# Patient Record
Sex: Male | Born: 1945 | Race: White | Hispanic: No | Marital: Married | Smoking: Former smoker
Health system: Southern US, Community
[De-identification: ages and names within clinical notes are randomized; demographics above are authoritative.]

## PROBLEM LIST (undated history)

## (undated) DIAGNOSIS — I10 Essential (primary) hypertension: Secondary | ICD-10-CM

## (undated) DIAGNOSIS — F4310 Post-traumatic stress disorder, unspecified: Secondary | ICD-10-CM

## (undated) DIAGNOSIS — Z720 Tobacco use: Secondary | ICD-10-CM

## (undated) DIAGNOSIS — I219 Acute myocardial infarction, unspecified: Secondary | ICD-10-CM

## (undated) DIAGNOSIS — J449 Chronic obstructive pulmonary disease, unspecified: Secondary | ICD-10-CM

## (undated) DIAGNOSIS — I350 Nonrheumatic aortic (valve) stenosis: Secondary | ICD-10-CM

## (undated) DIAGNOSIS — I472 Ventricular tachycardia, unspecified: Secondary | ICD-10-CM

## (undated) DIAGNOSIS — I2510 Atherosclerotic heart disease of native coronary artery without angina pectoris: Secondary | ICD-10-CM

## (undated) DIAGNOSIS — Z95810 Presence of automatic (implantable) cardiac defibrillator: Secondary | ICD-10-CM

## (undated) HISTORY — PX: OTHER SURGICAL HISTORY: SHX169

## (undated) HISTORY — DX: Presence of automatic (implantable) cardiac defibrillator: Z95.810

## (undated) HISTORY — DX: Atherosclerotic heart disease of native coronary artery without angina pectoris: I25.10

## (undated) HISTORY — DX: Post-traumatic stress disorder, unspecified: F43.10

## (undated) HISTORY — PX: NOSE SURGERY: SHX723

## (undated) HISTORY — PX: CORONARY ANGIOPLASTY: SHX604

## (undated) HISTORY — DX: Nonrheumatic aortic (valve) stenosis: I35.0

## (undated) HISTORY — DX: Essential (primary) hypertension: I10

## (undated) HISTORY — DX: Tobacco use: Z72.0

## (undated) HISTORY — PX: CARDIAC CATHETERIZATION: SHX172

## (undated) SURGERY — Surgical Case
Anesthesia: *Unknown

---

## 2007-12-15 ENCOUNTER — Emergency Department (HOSPITAL_COMMUNITY): Admission: EM | Admit: 2007-12-15 | Discharge: 2007-12-15 | Payer: Self-pay | Admitting: Family Medicine

## 2011-05-14 LAB — POCT I-STAT, CHEM 8
Creatinine, Ser: 1
HCT: 56 — ABNORMAL HIGH
Hemoglobin: 19 — ABNORMAL HIGH
Potassium: 4.3
Sodium: 133 — ABNORMAL LOW

## 2011-05-14 LAB — CBC
MCV: 88.7
Platelets: 270
RDW: 14.9
WBC: 19.4 — ABNORMAL HIGH

## 2011-05-14 LAB — DIFFERENTIAL
Basophils Absolute: 0.1
Eosinophils Absolute: 0.1
Lymphs Abs: 2.2
Neutrophils Relative %: 80 — ABNORMAL HIGH

## 2011-07-16 ENCOUNTER — Inpatient Hospital Stay (HOSPITAL_COMMUNITY)
Admission: EM | Admit: 2011-07-16 | Discharge: 2011-07-31 | DRG: 217 | Disposition: A | Discharge status: Home / Self Care | Payer: Medicare Other | Attending: Thoracic Surgery (Cardiothoracic Vascular Surgery) | Admitting: Thoracic Surgery (Cardiothoracic Vascular Surgery)

## 2011-07-16 ENCOUNTER — Encounter: Payer: Self-pay | Admitting: Emergency Medicine

## 2011-07-16 DIAGNOSIS — I509 Heart failure, unspecified: Secondary | ICD-10-CM | POA: Diagnosis present

## 2011-07-16 DIAGNOSIS — I723 Aneurysm of iliac artery: Secondary | ICD-10-CM | POA: Diagnosis present

## 2011-07-16 DIAGNOSIS — I4891 Unspecified atrial fibrillation: Secondary | ICD-10-CM | POA: Diagnosis not present

## 2011-07-16 DIAGNOSIS — E119 Type 2 diabetes mellitus without complications: Secondary | ICD-10-CM | POA: Diagnosis present

## 2011-07-16 DIAGNOSIS — R51 Headache: Secondary | ICD-10-CM | POA: Diagnosis not present

## 2011-07-16 DIAGNOSIS — D62 Acute posthemorrhagic anemia: Secondary | ICD-10-CM | POA: Diagnosis not present

## 2011-07-16 DIAGNOSIS — F17200 Nicotine dependence, unspecified, uncomplicated: Secondary | ICD-10-CM | POA: Diagnosis present

## 2011-07-16 DIAGNOSIS — E7800 Pure hypercholesterolemia, unspecified: Secondary | ICD-10-CM | POA: Diagnosis present

## 2011-07-16 DIAGNOSIS — I4901 Ventricular fibrillation: Secondary | ICD-10-CM

## 2011-07-16 DIAGNOSIS — Z95810 Presence of automatic (implantable) cardiac defibrillator: Secondary | ICD-10-CM

## 2011-07-16 DIAGNOSIS — I200 Unstable angina: Secondary | ICD-10-CM

## 2011-07-16 DIAGNOSIS — I359 Nonrheumatic aortic valve disorder, unspecified: Secondary | ICD-10-CM | POA: Diagnosis present

## 2011-07-16 DIAGNOSIS — J449 Chronic obstructive pulmonary disease, unspecified: Secondary | ICD-10-CM | POA: Diagnosis present

## 2011-07-16 DIAGNOSIS — J4489 Other specified chronic obstructive pulmonary disease: Secondary | ICD-10-CM | POA: Diagnosis present

## 2011-07-16 DIAGNOSIS — Z951 Presence of aortocoronary bypass graft: Secondary | ICD-10-CM

## 2011-07-16 DIAGNOSIS — Z952 Presence of prosthetic heart valve: Secondary | ICD-10-CM

## 2011-07-16 DIAGNOSIS — I252 Old myocardial infarction: Secondary | ICD-10-CM

## 2011-07-16 DIAGNOSIS — I2510 Atherosclerotic heart disease of native coronary artery without angina pectoris: Secondary | ICD-10-CM | POA: Diagnosis present

## 2011-07-16 DIAGNOSIS — Z886 Allergy status to analgesic agent status: Secondary | ICD-10-CM

## 2011-07-16 HISTORY — DX: Acute myocardial infarction, unspecified: I21.9

## 2011-07-16 HISTORY — DX: Ventricular tachycardia: I47.2

## 2011-07-16 HISTORY — DX: Ventricular tachycardia, unspecified: I47.20

## 2011-07-16 HISTORY — DX: Chronic obstructive pulmonary disease, unspecified: J44.9

## 2011-07-16 LAB — CBC
MCV: 88.4 fL (ref 78.0–100.0)
Platelets: 199 10*3/uL (ref 150–400)
RBC: 4.76 MIL/uL (ref 4.22–5.81)
RDW: 13.6 % (ref 11.5–15.5)
WBC: 9.4 10*3/uL (ref 4.0–10.5)

## 2011-07-16 LAB — CARDIAC PANEL(CRET KIN+CKTOT+MB+TROPI)
Relative Index: 2.6 — ABNORMAL HIGH (ref 0.0–2.5)
Troponin I: <0.3 ng/mL (ref <0.30)

## 2011-07-16 LAB — APTT: aPTT: 44 seconds — ABNORMAL HIGH (ref 24–37)

## 2011-07-16 LAB — COMPREHENSIVE METABOLIC PANEL
ALT: 18 U/L (ref 0–53)
AST: 20 U/L (ref 0–37)
Alkaline Phosphatase: 61 U/L (ref 39–117)
CO2: 29 mEq/L (ref 19–32)
GFR calc Af Amer: >90 mL/min (ref >90)
GFR calc non Af Amer: 87 mL/min — ABNORMAL LOW (ref >90)
Glucose, Bld: 106 mg/dL — ABNORMAL HIGH (ref 70–99)
Potassium: 3.5 mEq/L (ref 3.5–5.1)
Sodium: 141 mEq/L (ref 135–145)

## 2011-07-16 LAB — DIFFERENTIAL
Basophils Absolute: 0 10*3/uL (ref 0.0–0.1)
Lymphocytes Relative: 31 % (ref 12–46)
Lymphs Abs: 3 10*3/uL (ref 0.7–4.0)
Neutro Abs: 5.4 10*3/uL (ref 1.7–7.7)
Neutrophils Relative %: 58 % (ref 43–77)

## 2011-07-16 LAB — PROTIME-INR
INR: 0.95 (ref 0.00–1.49)
Prothrombin Time: 12.9 seconds (ref 11.6–15.2)

## 2011-07-16 MED ORDER — CHOLECALCIFEROL 400 UNIT/ML PO LIQD
400.0000 [IU] | Freq: Every day | ORAL | Status: DC
  Administered 2011-07-17: 400 [IU] via ORAL
  Filled 2011-07-16 (×5): qty 1

## 2011-07-16 MED ORDER — FLUNISOLIDE 25 MCG/ACT (0.025%) NA SOLN: 2.0000 | Freq: Two times a day (BID) | NASAL | Status: DC

## 2011-07-16 MED ORDER — BUDESONIDE-FORMOTEROL FUMARATE 160-4.5 MCG/ACT IN AERO
2.0000 | INHALATION_SPRAY | Freq: Two times a day (BID) | RESPIRATORY_TRACT | Status: DC
  Administered 2011-07-16 – 2011-07-31 (×27): 2 via RESPIRATORY_TRACT
  Filled 2011-07-16 (×2): qty 6

## 2011-07-16 MED ORDER — INSULIN GLARGINE 100 UNIT/ML ~~LOC~~ SOLN
56.0000 [IU] | Freq: Every day | SUBCUTANEOUS | Status: DC
  Administered 2011-07-16 – 2011-07-22 (×7): 56 [IU] via SUBCUTANEOUS
  Filled 2011-07-16 (×5): qty 3

## 2011-07-16 MED ORDER — ONDANSETRON HCL 4 MG/2ML IJ SOLN: 4.0000 mg | Freq: Four times a day (QID) | INTRAMUSCULAR | Status: DC | PRN

## 2011-07-16 MED ORDER — ASPIRIN EC 81 MG PO TBEC
81.0000 mg | DELAYED_RELEASE_TABLET | Freq: Every day | ORAL | Status: AC
  Administered 2011-07-17 – 2011-07-18 (×2): 81 mg via ORAL
  Filled 2011-07-16 (×2): qty 1

## 2011-07-16 MED ORDER — POTASSIUM CHLORIDE CRYS ER 20 MEQ PO TBCR
40.0000 meq | EXTENDED_RELEASE_TABLET | Freq: Once | ORAL | Status: AC
  Administered 2011-07-16: 40 meq via ORAL
  Filled 2011-07-16: qty 2

## 2011-07-16 MED ORDER — SIMVASTATIN 20 MG PO TABS
20.0000 mg | ORAL_TABLET | Freq: Every day | ORAL | Status: DC
  Administered 2011-07-16 – 2011-07-29 (×13): 20 mg via ORAL
  Filled 2011-07-16 (×15): qty 1

## 2011-07-16 MED ORDER — HYDROCODONE-ACETAMINOPHEN 10-325 MG PO TABS: 1.0000 | ORAL_TABLET | Freq: Four times a day (QID) | ORAL | Status: DC | PRN

## 2011-07-16 MED ORDER — SODIUM CHLORIDE 0.9 % IJ SOLN
3.0000 mL | Freq: Two times a day (BID) | INTRAMUSCULAR | Status: DC
  Administered 2011-07-17 – 2011-07-22 (×5): 3 mL via INTRAVENOUS

## 2011-07-16 MED ORDER — HEPARIN BOLUS VIA INFUSION
4000.0000 [IU] | INTRAVENOUS | Status: AC
  Administered 2011-07-16: 4000 [IU] via INTRAVENOUS
  Filled 2011-07-16: qty 4000

## 2011-07-16 MED ORDER — ALBUTEROL SULFATE HFA 108 (90 BASE) MCG/ACT IN AERS: 2.0000 | INHALATION_SPRAY | Freq: Four times a day (QID) | RESPIRATORY_TRACT | Status: DC | PRN

## 2011-07-16 MED ORDER — AMMONIUM LACTATE 12 % EX CREA: TOPICAL_CREAM | CUTANEOUS | Status: DC | PRN

## 2011-07-16 MED ORDER — SODIUM CHLORIDE 0.9 % IV SOLN: 250.0000 mL | INTRAVENOUS | Status: DC | PRN

## 2011-07-16 MED ORDER — ATENOLOL 100 MG PO TABS
100.0000 mg | ORAL_TABLET | Freq: Every day | ORAL | Status: DC
  Administered 2011-07-16 – 2011-07-22 (×7): 100 mg via ORAL
  Filled 2011-07-16 (×8): qty 1

## 2011-07-16 MED ORDER — ASPIRIN 81 MG PO CHEW
324.0000 mg | CHEWABLE_TABLET | ORAL | Status: AC
  Administered 2011-07-16: 324 mg via ORAL
  Filled 2011-07-16: qty 4

## 2011-07-16 MED ORDER — ALPRAZOLAM 0.25 MG PO TABS: 0.2500 mg | ORAL_TABLET | Freq: Two times a day (BID) | ORAL | Status: DC | PRN

## 2011-07-16 MED ORDER — FLUTICASONE PROPIONATE 50 MCG/ACT NA SUSP
2.0000 | Freq: Every day | NASAL | Status: DC
  Administered 2011-07-17 – 2011-07-31 (×12): 2 via NASAL
  Filled 2011-07-16 (×2): qty 16

## 2011-07-16 MED ORDER — INSULIN ASPART 100 UNIT/ML ~~LOC~~ SOLN
12.0000 [IU] | Freq: Three times a day (TID) | SUBCUTANEOUS | Status: DC
  Administered 2011-07-17 – 2011-07-22 (×12): 12 [IU] via SUBCUTANEOUS
  Filled 2011-07-16 (×16): qty 3

## 2011-07-16 MED ORDER — HEPARIN SOD (PORCINE) IN D5W 100 UNIT/ML IV SOLN
1900.0000 [IU]/h | INTRAVENOUS | Status: DC
  Administered 2011-07-16: 1300 [IU]/h via INTRAVENOUS
  Administered 2011-07-17 – 2011-07-18 (×3): 1800 [IU]/h via INTRAVENOUS
  Administered 2011-07-19: 1900 [IU]/h via INTRAVENOUS
  Filled 2011-07-16 (×11): qty 250

## 2011-07-16 MED ORDER — ZOLPIDEM TARTRATE 5 MG PO TABS
5.0000 mg | ORAL_TABLET | Freq: Every evening | ORAL | Status: DC | PRN
  Administered 2011-07-22: 5 mg via ORAL
  Filled 2011-07-16: qty 1

## 2011-07-16 MED ORDER — NITROGLYCERIN 0.4 MG SL SUBL: 0.4000 mg | SUBLINGUAL_TABLET | SUBLINGUAL | Status: DC | PRN

## 2011-07-16 MED ORDER — GABAPENTIN 100 MG PO CAPS
100.0000 mg | ORAL_CAPSULE | Freq: Three times a day (TID) | ORAL | Status: DC
  Administered 2011-07-16 – 2011-07-31 (×40): 100 mg via ORAL
  Filled 2011-07-16 (×49): qty 1

## 2011-07-16 MED ORDER — SODIUM CHLORIDE 0.9 % IJ SOLN: 3.0000 mL | INTRAMUSCULAR | Status: DC | PRN

## 2011-07-16 MED ORDER — AMMONIUM LACTATE 12 % EX LOTN
TOPICAL_LOTION | Freq: Every day | CUTANEOUS | Status: DC | PRN
  Filled 2011-07-16: qty 400

## 2011-07-16 MED ORDER — ACETAMINOPHEN 325 MG PO TABS: 650.0000 mg | ORAL_TABLET | ORAL | Status: DC | PRN

## 2011-07-16 NOTE — ED Notes (Signed)
Pt reports over past couple of weeks, been feeling weakness, numbness in L. hand and dizziness when he exerts himself. Pt reports defribrilator fired 4 weeks ago when pt was hunting, didn't seek medical attention. Pt reports today, was walking outside and became weak, dizzy, had syncopal episode. Pt unsure whether defibrilator fired. At this time pt alert and oriented. NAD.

## 2011-07-16 NOTE — ED Notes (Signed)
Spoke with [employer], Public librarian.  They are on their way to interrogate the patient's defibrillator.

## 2011-07-16 NOTE — ED Notes (Signed)
Paged Sean [name], regarding patient request for nicotine patch and pt bed request status

## 2011-07-16 NOTE — ED Notes (Signed)
Lab called critical [facility] 7.8, Dr. Judd Lien notified. MD notifed of [employer] findings

## 2011-07-16 NOTE — ED Notes (Signed)
Dr. Judd Lien at bedside to re-evaluate patient

## 2011-07-16 NOTE — ED Provider Notes (Signed)
History     CSN: [id number] Arrival date & time: 07/16/2011  3:33 PM   First MD Initiated Contact with Patient 07/16/11 1536      Chief Complaint  Patient presents with  . Loss of Consciousness    (Consider location/radiation/quality/duration/timing/severity/associated sxs/prior treatment) HPI Comments: Patient with history of aicd/pacer.  Today, did some yard work.  Then was leaving house to go to [employer] shop.  As he was walking out the door, he became dizzy, then passed out for several seconds.  This was witnessed by wife.  Feels fine now.  Does not recall being shocked.  Patient is a 65 y.o. male presenting with syncope. The history is provided by the patient.  Loss of Consciousness This is a recurrent problem. The current episode started less than 1 hour ago. The problem occurs rarely. The problem has been resolved. Pertinent negatives include no chest pain, no abdominal pain, no headaches and no shortness of breath. The symptoms are aggravated by intercourse. The symptoms are relieved by nothing. He has tried nothing for the symptoms.    Past Medical History  Diagnosis Date  . Myocardial infarction   . Ventricular tachycardia     Past Surgical History  Procedure Date  . Coronary artery bypass graft   . Pacemaker insertion     No family history on file.  History  Substance Use Topics  . Smoking status: Not on file  . Smokeless tobacco: Not on file  . Alcohol Use:       Review of Systems  Respiratory: Negative for shortness of breath.   Cardiovascular: Positive for syncope. Negative for chest pain.  Gastrointestinal: Negative for abdominal pain.  Neurological: Negative for headaches.  All other systems reviewed and are negative.    Allergies  Novocain and Rosuvastatin  Home Medications   Current Outpatient Rx  Name Route Sig Dispense Refill  . ALBUTEROL SULFATE HFA 108 (90 BASE) MCG/ACT IN AERS Inhalation Inhale 2 puffs into the lungs every 6 (six)  hours as needed.      . AMMONIUM LACTATE 12 % EX CREA Topical Apply topically as needed.      . ASPIRIN 81 MG PO TABS Oral Take 81 mg by mouth daily.      . ATENOLOL 100 MG PO TABS Oral Take 100 mg by mouth daily.      . ATORVASTATIN CALCIUM 10 MG PO TABS Oral Take 10 mg by mouth daily.      . BUDESONIDE-FORMOTEROL FUMARATE 160-4.5 MCG/ACT IN AERO Inhalation Inhale 2 puffs into the lungs 2 (two) times daily.      . CHOLECALCIFEROL 400 UNIT/ML PO LIQD Oral Take 400 Units by mouth daily.      Marland Kitchen FLUNISOLIDE 0.025 % NA SOLN Inhalation Inhale 2 sprays into the lungs 2 (two) times daily.      Marland Kitchen GABAPENTIN 100 MG PO CAPS Oral Take 100 mg by mouth 3 (three) times daily.      Marland Kitchen HYDROCODONE-ACETAMINOPHEN 10-325 MG PO TABS Oral Take 1 tablet by mouth every 6 (six) hours as needed.      . INSULIN ASPART 100 UNIT/ML [place] SOLN Subcutaneous Inject into the skin 3 (three) times daily before meals.        BP 132/67  Pulse 66  Resp 16  SpO2 97%  Physical Exam  Constitutional: He is oriented to person, place, and time. He appears well-developed and well-nourished. No distress.  HENT:  Head: Normocephalic and atraumatic.  Neck: Normal range of motion.  Neck supple.  Cardiovascular: Normal rate and regular rhythm.  Exam reveals no gallop and no friction rub.   No murmur heard. Pulmonary/Chest: Effort normal and breath sounds normal. No respiratory distress. He has no wheezes. He has no rales.  Abdominal: Soft. Bowel sounds are normal. He exhibits no distension. There is no tenderness.  Musculoskeletal: Normal range of motion. He exhibits no edema.  Neurological: He is alert and oriented to person, place, and time. No cranial nerve deficit.  Skin: Skin is warm and dry. He is not diaphoretic.    ED Course  Procedures (including critical care time)  Labs Reviewed - No data to display No results found.   No diagnosis found.   Date: 07/16/2011  Rate: 65  Rhythm: normal sinus rhythm  QRS Axis: left   Intervals: normal  ST/T Wave abnormalities: nonspecific ST changes  Conduction Disutrbances:none  Narrative Interpretation:   Old EKG Reviewed: none available    MDM  Lab work okay.  Defibrillator interrogated and was found to be cardioverted from a v-fib.  Spoke with Cardiology who will come and see the patient.        Geoffery Lyons, MD 07/16/11 2041

## 2011-07-16 NOTE — ED Notes (Signed)
Pt had a syncopal episode today. Pt unsure whether defibrilator fired. Secretary has call into manufacturer to determine status of device. Pt alert and oriented. NAD. Family at bedside

## 2011-07-16 NOTE — ED Notes (Signed)
[employer] on unit. Reports pt defribrilator did fire today. Pt went into VF, shocked and converted back to SR.

## 2011-07-16 NOTE — ED Notes (Signed)
Per EMS:  Pt here s/p syncope that happened suddenly today.  Pt was at home walking out the door when he became dizzy and leaned up against the wall and slid down the side of the wall to the ground.  Pt was out for about 1 minute per his wife.  Pt denies pain at any time and is alert and oriented x4 at this time.  Pt reports that his defibrillator fired 4 weeks ago.

## 2011-07-16 NOTE — ED Notes (Signed)
Report called to Kate RN.

## 2011-07-16 NOTE — H&P (Signed)
Sean [name] is an 65 y.o. male.   Chief Complaint: Defibrillator discharge  HPI: Sean [name] is a 65 year old gentleman with a history of coronary artery disease. He also has a history of congestive heart failure and has an AICD in place.  He is seen at the [facility].  Sean [name] has history of coronary artery disease.  Had a myocardial infarction in the mid 1990s and had an antitussive. He had a second heart attack several years later and had a stent placed.  He had a cardiac arrest following a prep for a GI procedure. Workup following this resulted in placement of an AICD. I do not have any specific records.    The patient had an episode of chest pain in April of this year. He was seen at the [facility] and was [facility]. Emergent cardiac catheterization revealed some small vessel disease but he did not have any stents or and plastic.  For the past several weeks he's been having worsening episodes of exertional angina. These episodes are characterized as left-sided neck pain with radiation down to the arm. He typically does not take a nitroglycerin because the pain "just wasn't that bad".  He's also noticed increasing fatigue and dyspnea with exertion. He's not able to do any of his normal activities because of this exertional fatigue.  Today he was out pulling weeds. He had his typical left-sided neck and arm pain which is consistent with his angina. Several seconds later, his defibrillator fired. His wife called EMS and he was brought to [facility].  Interrogation by the [facility] representative revealed that the patient had an appropriate shock for ventricular fibrillation.  He's had 2 such episodes.  He's not had any episodes of AICD discharge when he is at rest.  Medications Prior to Admission  Medication Dose Route Frequency Provider Last Rate Last Dose  . albuterol (PROVENTIL HFA;VENTOLIN HFA) 108 (90 BASE) MCG/ACT inhaler 2 puff  2 puff  Inhalation Q6H PRN Joline Salt Barrett, PA      . ammonium lactate (AMLACTIN) 12 % cream   Topical PRN Darrol Jump, PA      . atenolol (TENORMIN) tablet 100 mg  100 mg Oral Daily Joline Salt Barrett, PA      . budesonide-formoterol (SYMBICORT) 160-4.5 MCG/ACT inhaler 2 puff  2 puff Inhalation BID Darrol Jump, PA      . cholecalciferol (D-VI-SOL) 400 UNIT/ML oral liquid 400 Units  400 Units Oral Daily Rhonda G. Barrett, PA      . flunisolide (NASALIDE) 0.025 % nasal spray 2 spray  2 spray Nasal BID Joline Salt Barrett, PA      . gabapentin (NEURONTIN) capsule 100 mg  100 mg Oral TID Darrol Jump, PA      . HYDROcodone-acetaminophen (NORCO) 10-325 MG per tablet 1 tablet  1 tablet Oral Q6H PRN Joline Salt Barrett, PA      . insulin aspart (novoLOG) injection 12 Units  12 Units Subcutaneous TID AC Rhonda G. Barrett, PA      . insulin glargine (LANTUS) injection 56 Units  56 Units Subcutaneous QHS Rhonda G. Barrett, PA      . potassium chloride SA (K-DUR,KLOR-CON) CR tablet 40 mEq  40 mEq Oral Once Darrol Jump, PA      . simvastatin (ZOCOR) tablet 20 mg  20 mg Oral Daily Darrol Jump, PA       No current outpatient prescriptions on file as of  07/16/2011.    Allergies:  Allergies  Allergen Reactions  . Novocain Other (See Comments)    unknown  . Rosuvastatin Other (See Comments)    Muscle pain    Past Medical History  Diagnosis Date  . Myocardial infarction   . Ventricular tachycardia   . Diabetes mellitus   . COPD (chronic obstructive pulmonary disease)     Past Surgical History  Procedure Date  . Coronary artery bypass graft   . Pacemaker insertion   . Nose surgery     skin cancer    No family history on file. Social History:  does not have a smoking history on file. He does not have any smokeless tobacco history on file. His alcohol and drug histories not on file.  Results for orders placed during the hospital encounter of 07/16/11 (from the past 48  hour(s))  CBC     Status: Normal   Collection Time   07/16/11  4:01 PM      Component Value Range Comment   WBC 9.4  4.0 - 10.5 (K/uL)    RBC 4.76  4.22 - 5.81 (MIL/uL)    Hemoglobin 14.2  13.0 - 17.0 (g/dL)    HCT 56.2  13.0 - 86.5 (%)    MCV 88.4  78.0 - 100.0 (fL)    MCH 29.8  26.0 - 34.0 (pg)    MCHC 33.7  30.0 - 36.0 (g/dL)    RDW 78.4  69.6 - 29.5 (%)    Platelets 199  150 - 400 (K/uL)   DIFFERENTIAL     Status: Normal   Collection Time   07/16/11  4:01 PM      Component Value Range Comment   Neutrophils Relative 58  43 - 77 (%)    Neutro Abs 5.4  1.7 - 7.7 (K/uL)    Lymphocytes Relative 31  12 - 46 (%)    Lymphs Abs 3.0  0.7 - 4.0 (K/uL)    Monocytes Relative 6  3 - 12 (%)    Monocytes Absolute 0.6  0.1 - 1.0 (K/uL)    Eosinophils Relative 5  0 - 5 (%)    Eosinophils Absolute 0.5  0.0 - 0.7 (K/uL)    Basophils Relative 0  0 - 1 (%)    Basophils Absolute 0.0  0.0 - 0.1 (K/uL)   COMPREHENSIVE METABOLIC PANEL     Status: Abnormal   Collection Time   07/16/11  4:01 PM      Component Value Range Comment   Sodium 141  135 - 145 (mEq/L)    Potassium 3.5  3.5 - 5.1 (mEq/L)    Chloride 103  96 - 112 (mEq/L)    CO2 29  19 - 32 (mEq/L)    Glucose, Bld 106 (*) 70 - 99 (mg/dL)    BUN 15  6 - 23 (mg/dL)    Creatinine, Ser 2.84  0.50 - 1.35 (mg/dL)    Calcium 8.9  8.4 - 10.5 (mg/dL)    Total Protein 6.6  6.0 - 8.3 (g/dL)    Albumin 3.5  3.5 - 5.2 (g/dL)    AST 20  0 - 37 (U/L)    ALT 18  0 - 53 (U/L)    Alkaline Phosphatase 61  39 - 117 (U/L)    Total Bilirubin 0.3  0.3 - 1.2 (mg/dL)    GFR calc non Af Amer 87 (*) >90 (mL/min)    GFR calc Af Amer >90  >90 (mL/min)  CARDIAC PANEL(CRET KIN+CKTOT+MB+TROPI)     Status: Abnormal   Collection Time   07/16/11  4:01 PM      Component Value Range Comment   Total CK 305 (*) 7 - 232 (U/L)    CK, MB 7.8 (*) 0.3 - 4.0 (ng/mL)    Troponin I <0.30  <0.30 (ng/mL)    Relative Index 2.6 (*) 0.0 - 2.5     No results found.  The  Review of Systems was reviewed in the H%P.  All other systems are negative  Blood pressure 125/62, pulse 63, resp. rate 16, SpO2 95.00%. The patient is alert and oriented x 3.  The mood and affect are normal.   Skin: warm and dry.  Color is normal.    HEENT:   the sclera are nonicteric.  The mucous membranes are moist.  The carotids are 2+ without bruits.  There is no thyromegaly.  There is no JVD.    Lungs: clear.  The chest wall is non tender.    Heart: regular rate with a normal S1 and S2.  There is a 1-2 / 6 systolic murmur Abdomen: good bowel sounds.  There is no guarding or rebound.  There is no hepatosplenomegaly or tenderness.  There are no masses.   Extremities:  no clubbing, cyanosis, or edema.  The legs are without rashes.  The distal pulses are intact.   Neuro:  Cranial nerves II - XII are intact.  Motor and sensory functions are intact.    ECG:  NSR. Old Inferior MI, Old Anterior MI.  No St or T wave changes   Assessment/Plan 1. Ventricular fibrillation: The patient has had 2 separate episodes of ventricular fibrillation. These were caused by physical exertion with subsequent exertional angina and were presumably due to ischemia.  He's been having episodes of exertional fatigue, left-sided neck pain, and left arm pain for the past several weeks.  His AICD interrogation confirms the presence of ventricular fibrillation.  Sean [name] appears to be having symptoms consistent with unstable angina that lead to ventricular fibrillation. I think that our first step is to proceed with cardiac catheterization. We'll need to be revascularized if we find any significant coronary artery disease. If we are unable to find any significant lesions for revascularization, then we'll need to consider  Amiodarone.  We'll ask the electrophysiologists for help with these issues.  The patient would rather have his workup at the [facility] if at all possible. His wife will see if she can arrange for   payment for services here at [facility] or we can arrange for transfer to the [facility] tomorrow morning.  2. Coronary artery disease: The patient clearly has symptoms of unstable angina. He's having angina like symptoms with minimal exertion such as walking up a hill and pulling weeds. I think he needs repeat cardiac catheterization. As noted above, he would like to have further workup at the [facility]. We can certainly provided his service here if he would like. He is currently pain-free. We'll get cardiac enzymes to rule out myocardial in origin. It would not be surprising if his enzymes are slightly elevated because of the AICD  Discharge.  3.  Diabetes mellitus: We'll continue with the patient's diabetic medicines. We'll check CBGs 4 times a day. Further management will be per his medical doctor.  4. Hypercholesterolemia: the patient is currently on Lipitor. We will substitute simvastatin while he is in-house but that he would need to be discharged on  his home dose of Lipitor. He has significant muscle aches with Crestor.  Elyn Aquas. 07/16/2011, 6:47 PM

## 2011-07-16 NOTE — Progress Notes (Signed)
ANTICOAGULATION CONSULT NOTE - Initial Consult  Pharmacy Consult for Heparin Indication: unstable angina  Allergies  Allergen Reactions  . Novocain Other (See Comments)    unknown  . Rosuvastatin Other (See Comments)    Muscle pain    Patient Measurements: Height: 6' (182.9 cm) Weight: 205 lb (92.987 kg) IBW/kg (Calculated) : 77.6   Vital Signs: Temp src: Oral (11/27 1546) BP: 121/58 mmHg (11/27 1914) Pulse Rate: 64  (11/27 1914)  Labs:  Basename 07/16/11 1601  HGB 14.2  HCT 42.1  PLT 199  APTT --  LABPROT --  INR --  HEPARINUNFRC --  CREATININE 0.89  CKTOTAL 305*  CKMB 7.8*  TROPONINI <0.30   Estimated Creatinine Clearance: 88.4 ml/min (by C-G formula based on Cr of 0.89).  Medical History: Past Medical History  Diagnosis Date  . Myocardial infarction   . Ventricular tachycardia   . Diabetes mellitus   . COPD (chronic obstructive pulmonary disease)     Medications:  See PTA med list  Assessment: 65 yo M to begin heparin for unstable angina. Patient's AICD fired due to V. Fib presumed to be from ischemia.   Goal of Therapy:  Heparin level 0.3-0.7 units/ml   Plan:  1. Heparin 4000 units IV bolus then 1300 units/hr 2. Heparin level 6 hrs after started 3. Daily heparin level and CBC  Sean [name], Sean [name] 07/16/2011,7:34 PM

## 2011-07-17 ENCOUNTER — Inpatient Hospital Stay (HOSPITAL_COMMUNITY): Payer: Medicare Other

## 2011-07-17 LAB — GLUCOSE, CAPILLARY: Glucose-Capillary: 270 mg/dL — ABNORMAL HIGH (ref 70–99)

## 2011-07-17 LAB — BASIC METABOLIC PANEL
Calcium: 8.6 mg/dL (ref 8.4–10.5)
Creatinine, Ser: 1.03 mg/dL (ref 0.50–1.35)
GFR calc Af Amer: 85 mL/min — ABNORMAL LOW (ref >90)
GFR calc non Af Amer: 73 mL/min — ABNORMAL LOW (ref >90)
Sodium: 137 mEq/L (ref 135–145)

## 2011-07-17 LAB — CARDIAC PANEL(CRET KIN+CKTOT+MB+TROPI)
CK, MB: 6.3 ng/mL (ref 0.3–4.0)
Relative Index: 3 — ABNORMAL HIGH (ref 0.0–2.5)
Total CK: 227 U/L (ref 7–232)
Total CK: 195 U/L (ref 7–232)

## 2011-07-17 LAB — HEPARIN LEVEL (UNFRACTIONATED)
Heparin Unfractionated: 0.1 IU/mL — ABNORMAL LOW (ref 0.30–0.70)
Heparin Unfractionated: 0.55 IU/mL (ref 0.30–0.70)

## 2011-07-17 LAB — TSH: TSH: 0.802 u[IU]/mL (ref 0.350–4.500)

## 2011-07-17 LAB — HEMOGLOBIN A1C: Hgb A1c MFr Bld: 10.7 % — ABNORMAL HIGH (ref <5.7)

## 2011-07-17 MED ORDER — INSULIN ASPART 100 UNIT/ML ~~LOC~~ SOLN
0.0000 [IU] | Freq: Every day | SUBCUTANEOUS | Status: DC
  Administered 2011-07-18 – 2011-07-19 (×2): 3 [IU] via SUBCUTANEOUS
  Administered 2011-07-20: 2 [IU] via SUBCUTANEOUS
  Administered 2011-07-21: 3 [IU] via SUBCUTANEOUS
  Administered 2011-07-22: 2 [IU] via SUBCUTANEOUS

## 2011-07-17 MED ORDER — HEPARIN BOLUS VIA INFUSION
3000.0000 [IU] | Freq: Once | INTRAVENOUS | Status: AC
  Administered 2011-07-17: 3000 [IU] via INTRAVENOUS
  Filled 2011-07-17: qty 3000

## 2011-07-17 MED ORDER — INSULIN ASPART 100 UNIT/ML ~~LOC~~ SOLN: 12.0000 [IU] | Freq: Three times a day (TID) | SUBCUTANEOUS | Status: DC

## 2011-07-17 MED ORDER — INSULIN GLARGINE 100 UNIT/ML ~~LOC~~ SOLN: 56.0000 [IU] | Freq: Every day | SUBCUTANEOUS | Status: DC

## 2011-07-17 MED ORDER — NICOTINE 14 MG/24HR TD PT24
14.0000 mg | MEDICATED_PATCH | Freq: Every day | TRANSDERMAL | Status: DC
  Administered 2011-07-17 – 2011-07-22 (×6): 14 mg via TRANSDERMAL
  Filled 2011-07-17 (×8): qty 1

## 2011-07-17 MED ORDER — INSULIN ASPART 100 UNIT/ML ~~LOC~~ SOLN
0.0000 [IU] | Freq: Three times a day (TID) | SUBCUTANEOUS | Status: DC
  Administered 2011-07-17: 8 [IU] via SUBCUTANEOUS
  Administered 2011-07-17 – 2011-07-18 (×3): 5 [IU] via SUBCUTANEOUS
  Administered 2011-07-19 – 2011-07-20 (×3): 8 [IU] via SUBCUTANEOUS
  Administered 2011-07-20: 11 [IU] via SUBCUTANEOUS
  Administered 2011-07-20: 5 [IU] via SUBCUTANEOUS
  Administered 2011-07-21: 11 [IU] via SUBCUTANEOUS
  Administered 2011-07-21 (×2): 3 [IU] via SUBCUTANEOUS
  Administered 2011-07-22: 2 [IU] via SUBCUTANEOUS
  Administered 2011-07-22: 5 [IU] via SUBCUTANEOUS

## 2011-07-17 NOTE — Progress Notes (Signed)
ANTICOAGULATION CONSULT NOTE - Follow Up Consult  Pharmacy Consult for heparin Indication: [place]  Allergies  Allergen Reactions  . Novocain Other (See Comments)    unknown  . Rosuvastatin Other (See Comments)    Muscle pain    Patient Measurements: Height: 6' (182.9 cm) Weight: 212 lb 4.9 oz (96.3 kg) IBW/kg (Calculated) : 77.6  Labs:  Basename 07/17/11 1235 07/17/11 0615 07/17/11 0309 07/16/11 2237 07/16/11 1601  HGB -- -- -- -- 14.2  HCT -- -- -- -- 42.1  PLT -- -- -- -- 199  APTT -- -- -- 44* --  LABPROT -- -- -- 12.9 --  INR -- -- -- 0.95 --  HEPARINUNFRC 0.55 -- 0.10* -- --  CREATININE -- -- 1.03 -- 0.89  CKTOTAL -- 195 -- 227 305*  CKMB -- 5.8* -- 6.3* 7.8*  TROPONINI -- <0.30 -- <0.30 <0.30   Estimated Creatinine Clearance: 83.8 ml/min (by C-G formula based on Cr of 1.03).  Assessment: Heparin level 0.55 after 3000 unit bolus and rate increase to 1800 units/hr. Therapeutic HL.  No bleeding reported. Goal of Therapy:  Heparin level 0.3-0.7 units/ml   Plan:  Continue heparin at 1800 units/hr, daily HL and CBC.  Len Childs T 07/17/2011,1:56 PM

## 2011-07-17 NOTE — Plan of Care (Signed)
Problem: Phase I Progression Outcomes Goal: Pain controlled with appropriate interventions Outcome: Not Applicable Date Met:  07/17/11 No c/o pain

## 2011-07-17 NOTE — Progress Notes (Signed)
Subjective:   Sean [name] is a 65 year old gentleman with a history of coronary artery disease. He also has a history of congestive heart failure and has an AICD in place. He is seen at the [facility] he was out pulling weeds. He had his typical left-sided neck and arm pain which is consistent with his angina. Several seconds later, his defibrillator fired. His wife called EMS and he was brought to [facility].   Interrogation by the [facility] representative revealed that the patient had an appropriate shock for ventricular fibrillation. He's had 2 such episodes. He's not had any episodes of AICD discharge when he is at rest  He has done well overnight.  No angina. No AICD discharges.     Marland Kitchen aspirin  324 mg Oral NOW  . aspirin EC  81 mg Oral Daily  . atenolol  100 mg Oral Daily  . budesonide-formoterol  2 puff Inhalation BID  . cholecalciferol  400 Units Oral Daily  . fluticasone  2 spray Each Nare Daily  . gabapentin  100 mg Oral TID  . heparin  3,000 Units Intravenous Once  . heparin  4,000 Units Intravenous NOW  . insulin aspart  0-15 Units Subcutaneous TID WC  . insulin aspart  0-5 Units Subcutaneous QHS  . insulin aspart  12 Units Subcutaneous TID AC  . insulin glargine  56 Units Subcutaneous QHS  . potassium chloride  40 mEq Oral Once  . simvastatin  20 mg Oral QHS  . sodium chloride  3 mL Intravenous Q12H  . DISCONTD: flunisolide  2 spray Nasal BID  . DISCONTD: insulin aspart  12 Units Subcutaneous TID WC  . DISCONTD: insulin glargine  56 Units Subcutaneous QHS      . heparin 1,800 Units/hr (07/17/11 0757)    Objective:  Vital Signs in the last 24 hours: Blood pressure 139/54, pulse 58, temperature 98 F (36.7 C), temperature source Oral, resp. rate 20, height 6' (1.829 m), weight 212 lb 4.9 oz (96.3 kg), SpO2 94.00%. Temp:  [97.6 F (36.4 C)-98.1 F (36.7 C)] 98 F (36.7 C) (11/28 0746) Pulse Rate:  [57-75] 58  (11/28 0800) Resp:   [16-32] 20  (11/28 0800) BP: (105-141)/(41-88) 139/54 mmHg (11/28 0800) SpO2:  [90 %-98 %] 94 % (11/28 0800) Weight:  [205 lb (92.987 kg)-212 lb 4.9 oz (96.3 kg)] 212 lb 4.9 oz (96.3 kg) (11/28 0500)  Intake/Output from previous day: 11/27 0701 - 11/28 0700 In: 696 [P.O.:500; I.V.:196] Out: 1225 [Urine:1225] Intake/Output from this shift:    Physical Exam: The patient is alert and oriented x 3.  The mood and affect are normal.   Skin: warm and dry.  Color is normal.    HEENT:   the sclera are nonicteric.  The mucous membranes are moist.  The carotids are 2+ without bruits.  There is no thyromegaly.  There is no JVD.    Lungs: clear.  The chest wall is non tender.    Heart: regular rate with a normal S1 and S2.  There are no murmurs, gallops, or rubs. The PMI is not displaced.     Abdomen: good bowel sounds.  There is no guarding or rebound.  There is no hepatosplenomegaly or tenderness.  There are no masses.   Extremities:  no clubbing, cyanosis, or edema.  The legs are without rashes.  The distal pulses are intact.   Neuro:  Cranial nerves II - XII are intact.  Motor  and sensory functions are intact.     Lab Results:  [facility] 07/16/11 1601  WBC 9.4  HGB 14.2  PLT 199    Basename 07/17/11 0309 07/16/11 1601  NA 137 141  K 3.9 3.5  CL 101 103  CO2 26 29  GLUCOSE 397* 106*  BUN 16 15  CREATININE 1.03 0.89    Basename 07/17/11 0615 07/16/11 2237  TROPONINI <0.30 <0.30   No results found for this basename: BNP in the last 72 hours Hepatic Function Panel  Basename 07/16/11 1601  PROT 6.6  ALBUMIN 3.5  AST 20  ALT 18  ALKPHOS 61  BILITOT 0.3  BILIDIR --  IBILI --   No results found for this basename: CHOL, HDL, LDLCALC, LDLDIRECT, TRIG, CHOLHDL    Basename 07/16/11 2237  INR 0.95     Tele:  NSR  Assessment/Plan:    1.  CAD (coronary artery disease) (07/16/2011)  He remains stable.  I  Think he needs a cath because of his exertional angina and  VF.  Will arrange for the cath to be done this afternoon or at the [place] if he is transferred.  2. Ventricular Fibrillation:  Seems to be related to exertion and I suspect due to ischemia.  Needs a cath.  No amiodarone yet.  3.  Diabetes mellitus (07/16/2011)  Glucose levels continue to be elevated.  Will add sliding scale.    Vesta Mixer, Montez Hageman., MD, [facility] 07/17/2011, 8:26 AM

## 2011-07-17 NOTE — Progress Notes (Signed)
UR Completed.  [name] [phone] 07/17/2011  

## 2011-07-17 NOTE — Progress Notes (Signed)
ANTICOAGULATION CONSULT NOTE - Follow Up Consult  Pharmacy Consult for heparin Indication: [facility]AP  Allergies  Allergen Reactions  . Novocain Other (See Comments)    unknown  . Rosuvastatin Other (See Comments)    Muscle pain   Patient Measurements: Height: 6' (182.9 cm) Weight: 212 lb 4.9 oz (96.3 kg) IBW/kg (Calculated) : 77.6   Vital Signs: Temp: 97.9 F (36.6 C) (11/28 0400) Temp src: Oral (11/28 0400) BP: 105/47 mmHg (11/28 0400) Pulse Rate: 59  (11/28 0500)  Labs:  Basename 07/17/11 0309 07/16/11 2237 07/16/11 1601  HGB -- -- 14.2  HCT -- -- 42.1  PLT -- -- 199  APTT -- 44* --  LABPROT -- 12.9 --  INR -- 0.95 --  HEPARINUNFRC 0.10* -- --  CREATININE 1.03 -- 0.89  CKTOTAL -- 227 305*  CKMB -- 6.3* 7.8*  TROPONINI -- <0.30 <0.30   Estimated Creatinine Clearance: 83.8 ml/min (by C-G formula based on Cr of 1.03).  Medications:  Scheduled:    . aspirin  324 mg Oral NOW  . aspirin EC  81 mg Oral Daily  . atenolol  100 mg Oral Daily  . budesonide-formoterol  2 puff Inhalation BID  . cholecalciferol  400 Units Oral Daily  . fluticasone  2 spray Each Nare Daily  . gabapentin  100 mg Oral TID  . heparin  3,000 Units Intravenous Once  . heparin  4,000 Units Intravenous NOW  . insulin aspart  12 Units Subcutaneous TID AC  . insulin glargine  56 Units Subcutaneous QHS  . potassium chloride  40 mEq Oral Once  . simvastatin  20 mg Oral QHS  . sodium chloride  3 mL Intravenous Q12H  . DISCONTD: flunisolide  2 spray Nasal BID   Infusions:    . heparin 13 mL/hr (07/17/11 0500)   Assessment: 65yo male subtherapeutic on heparin with initial dosing for USAP s/p AICD firing.  Goal of Therapy:  Heparin level 0.3-0.7 units/ml   Plan:  Will increase gtt by ~4 units/kg/hr to 1800 units/hr and check level in 6hr.  Colleen Can PharmD BCPS 07/17/2011,6:04 AM

## 2011-07-17 NOTE — Plan of Care (Signed)
Problem: Phase I Progression Outcomes Goal: Anginal pain relieved Outcome: Not Applicable Date Met:  07/17/11 Patient never had chest pain, however was admitted to the hospital after a syncopal episode and AICD fired for Vfib (interrogated in the ED)

## 2011-07-17 NOTE — Progress Notes (Signed)
CARE MANAGEMENT NOTE 07/17/2011  Patient:  Sean [name], Sean [name]   Account Number:  [id number]  Date Initiated:  07/17/2011  Documentation initiated by:  Vance Peper  Subjective/Objective Assessment:   65 yr old male admitted for A-fib.     Action/Plan:   Discharge planning. Spoke with patient and his wife.Patient needs a heart cath.   Status of service:  In process, will continue to follow Comments:  07/17/11 12:30 pm Vance Peper, RN BSN Case Manager [name].Information has been forwarded to a transfer coordinator, Case manager will forward them requested information and should receive a transfer packet. Patient has right to refuse transfer to [place], will speak further with he and his wife.

## 2011-07-17 NOTE — Progress Notes (Addendum)
CBGs today: 206/ 270 mg/dl  Started Lantus 56 units QHS last pm Started Novolog Moderate SSI and 12 units Novolog meal coverage today.  If fasting sugars stay elevated, may want to increase Lantus to 60 units QHS.  May also want to titrate Novolog meal coverage if postprandial CBGs stay elevated.    Will continue to follow and assist as needed.

## 2011-07-18 ENCOUNTER — Other Ambulatory Visit: Payer: Self-pay

## 2011-07-18 ENCOUNTER — Encounter (HOSPITAL_COMMUNITY)
Admission: EM | Disposition: A | Discharge status: Home / Self Care | Payer: Self-pay | Source: Home / Self Care | Attending: Thoracic Surgery (Cardiothoracic Vascular Surgery)

## 2011-07-18 DIAGNOSIS — I200 Unstable angina: Secondary | ICD-10-CM | POA: Diagnosis present

## 2011-07-18 DIAGNOSIS — I2510 Atherosclerotic heart disease of native coronary artery without angina pectoris: Secondary | ICD-10-CM

## 2011-07-18 DIAGNOSIS — I4901 Ventricular fibrillation: Principal | ICD-10-CM | POA: Diagnosis present

## 2011-07-18 HISTORY — PX: FRACTIONAL FLOW RESERVE WIRE: SHX5839

## 2011-07-18 HISTORY — PX: LEFT HEART CATHETERIZATION WITH CORONARY ANGIOGRAM: SHX5451

## 2011-07-18 LAB — GLUCOSE, CAPILLARY

## 2011-07-18 LAB — POCT ACTIVATED CLOTTING TIME: Activated Clotting Time: 408 seconds

## 2011-07-18 LAB — CBC
HCT: 45.5 % (ref 39.0–52.0)
MCH: 30.7 pg (ref 26.0–34.0)
MCV: 88.5 fL (ref 78.0–100.0)
RDW: 13.6 % (ref 11.5–15.5)
WBC: 7.3 10*3/uL (ref 4.0–10.5)

## 2011-07-18 LAB — BASIC METABOLIC PANEL
CO2: 27 mEq/L (ref 19–32)
Calcium: 9.3 mg/dL (ref 8.4–10.5)
Chloride: 100 mEq/L (ref 96–112)
Creatinine, Ser: 0.73 mg/dL (ref 0.50–1.35)
Glucose, Bld: 199 mg/dL — ABNORMAL HIGH (ref 70–99)

## 2011-07-18 SURGERY — LEFT HEART CATHETERIZATION WITH CORONARY ANGIOGRAM
Anesthesia: LOCAL

## 2011-07-18 MED ORDER — SODIUM CHLORIDE 0.9 % IJ SOLN: 3.0000 mL | INTRAMUSCULAR | Status: DC | PRN

## 2011-07-18 MED ORDER — NITROGLYCERIN 0.2 MG/ML ON CALL CATH LAB
INTRAVENOUS | Status: AC
  Filled 2011-07-18: qty 1

## 2011-07-18 MED ORDER — FENTANYL CITRATE 0.05 MG/ML IJ SOLN
INTRAMUSCULAR | Status: AC
  Filled 2011-07-18: qty 2

## 2011-07-18 MED ORDER — ASPIRIN EC 81 MG PO TBEC
81.0000 mg | DELAYED_RELEASE_TABLET | Freq: Every day | ORAL | Status: DC
  Administered 2011-07-20 – 2011-07-22 (×3): 81 mg via ORAL
  Filled 2011-07-18 (×4): qty 1

## 2011-07-18 MED ORDER — LIDOCAINE HCL (PF) 1 % IJ SOLN
INTRAMUSCULAR | Status: AC
  Filled 2011-07-18: qty 30

## 2011-07-18 MED ORDER — ASPIRIN 81 MG PO CHEW: 324.0000 mg | CHEWABLE_TABLET | ORAL | Status: DC

## 2011-07-18 MED ORDER — ONDANSETRON HCL 4 MG/2ML IJ SOLN: 4.0000 mg | Freq: Four times a day (QID) | INTRAMUSCULAR | Status: DC | PRN

## 2011-07-18 MED ORDER — ADENOSINE 12 MG/4ML IV SOLN
16.0000 mL | Freq: Once | INTRAVENOUS | Status: DC
  Filled 2011-07-18: qty 16

## 2011-07-18 MED ORDER — SODIUM CHLORIDE 0.9 % IJ SOLN: 3.0000 mL | Freq: Two times a day (BID) | INTRAMUSCULAR | Status: DC

## 2011-07-18 MED ORDER — VERAPAMIL HCL 2.5 MG/ML IV SOLN
INTRAVENOUS | Status: AC
  Filled 2011-07-18: qty 2

## 2011-07-18 MED ORDER — SODIUM CHLORIDE 0.9 % IV SOLN: 250.0000 mL | INTRAVENOUS | Status: DC | PRN

## 2011-07-18 MED ORDER — ACETAMINOPHEN 325 MG PO TABS: 650.0000 mg | ORAL_TABLET | ORAL | Status: DC | PRN

## 2011-07-18 MED ORDER — DIAZEPAM 5 MG PO TABS
5.0000 mg | ORAL_TABLET | ORAL | Status: AC
  Administered 2011-07-18: 5 mg via ORAL
  Filled 2011-07-18: qty 1

## 2011-07-18 MED ORDER — MIDAZOLAM HCL 2 MG/2ML IJ SOLN
INTRAMUSCULAR | Status: AC
  Filled 2011-07-18: qty 2

## 2011-07-18 MED ORDER — HEPARIN SODIUM (PORCINE) 1000 UNIT/ML IJ SOLN
INTRAMUSCULAR | Status: AC
  Filled 2011-07-18: qty 1

## 2011-07-18 MED ORDER — SODIUM CHLORIDE 0.9 % IV SOLN: INTRAVENOUS | Status: DC

## 2011-07-18 MED ORDER — BIVALIRUDIN 250 MG IV SOLR
INTRAVENOUS | Status: AC
  Filled 2011-07-18: qty 250

## 2011-07-18 MED ORDER — HEPARIN (PORCINE) IN NACL 2-0.9 UNIT/ML-% IJ SOLN
INTRAMUSCULAR | Status: AC
  Filled 2011-07-18: qty 2000

## 2011-07-18 NOTE — Progress Notes (Signed)
07/18/11 8413-2440 Vance Peper, RN BSN Case Manager [phone] Received packet of info from [facility] -[place]. Bed availablitity has not been determined at [facility]. Mr. and Mrs. Stuckey have expressed major conerns regarding having a huge hospital bill and inability to pay. I have relayed these concerns to Mr. Orvan Falconer, and the patient spoke with him also. Patient and wife are refusing to sign transfer or waiver to transfer form. Patient will stay at [facility]. I spoke with Dr. Elease Hashimoto and explained to him the process for [facility] transfer and where we are.Patient will have cath and possible PCI today.

## 2011-07-18 NOTE — H&P (View-Only) (Signed)
Darl Pikes with care management has reached the [place].  They cannot guarantee him a bed today.  Given his symptoms of exertional cp with minimal exertion associated with ventricular fibrillation,  he needs cath and possible PCI today.  Will keep him here and do cath this afternoon.  Vesta Mixer, Montez Hageman., MD, [facility] 07/18/2011, 10:40 AM

## 2011-07-18 NOTE — Progress Notes (Signed)
Subjective:    Pt denies any chest pain.  Presented with AICD shocks.  Clearly related to exertion - I suspect he's having ischemia.  I called the [facility] yesterday and was on hold for 20 minutes and had to hang up.  Attempted to call the chief medical resident.  Care manager has also called the [place].  The patient needs a cath.      Marland Kitchen aspirin EC  81 mg Oral Daily  . atenolol  100 mg Oral Daily  . budesonide-formoterol  2 puff Inhalation BID  . cholecalciferol  400 Units Oral Daily  . fluticasone  2 spray Each Nare Daily  . gabapentin  100 mg Oral TID  . insulin aspart  0-15 Units Subcutaneous TID WC  . insulin aspart  0-5 Units Subcutaneous QHS  . insulin aspart  12 Units Subcutaneous TID AC  . insulin glargine  56 Units Subcutaneous QHS  . nicotine  14 mg Transdermal Daily  . simvastatin  20 mg Oral QHS  . sodium chloride  3 mL Intravenous Q12H  . DISCONTD: insulin aspart  12 Units Subcutaneous TID WC  . DISCONTD: insulin glargine  56 Units Subcutaneous QHS      . heparin 1,800 Units (07/18/11 0700)    Objective:  Vital Signs in the last 24 hours: Blood pressure 148/60, pulse 55, temperature 97.4 F (36.3 C), temperature source Oral, resp. rate 20, height 6' (1.829 m), weight 210 lb 8.6 oz (95.5 kg), SpO2 98.00%. Temp:  [97.4 F (36.3 C)-98.2 F (36.8 C)] 97.4 F (36.3 C) (11/29 0339) Pulse Rate:  [55-64] 55  (11/29 0700) Resp:  [16-26] 20  (11/29 0700) BP: (110-152)/(39-88) 148/60 mmHg (11/29 0700) SpO2:  [92 %-99 %] 98 % (11/29 0700) Weight:  [210 lb 8.6 oz (95.5 kg)] 210 lb 8.6 oz (95.5 kg) (11/29 0600)  Intake/Output from previous day: 11/28 0701 - 11/29 0700 In: 2668 [P.O.:2160; I.V.:508] Out: 2675 [Urine:2675] Intake/Output from this shift:    Physical Exam: The patient is alert and oriented x 3.  The mood and affect are normal.   Skin: warm and dry.  Color is normal.    HEENT:   the sclera are nonicteric.  The mucous membranes are moist.  The  carotids are 2+ without bruits.  There is no thyromegaly.  There is no JVD.    Lungs: clear.  The chest wall is non tender.    Heart: regular rate with a normal S1 and S2.  There are no murmurs, gallops, or rubs. The PMI is not displaced.     Abdomen: good bowel sounds.  There is no guarding or rebound.  There is no hepatosplenomegaly or tenderness.  There are no masses.   Extremities:  no clubbing, cyanosis, or edema.  The legs are without rashes.  The distal pulses are intact.   Neuro:  Cranial nerves II - XII are intact.  Motor and sensory functions are intact.     Lab Results:  [facility] 07/16/11 1601  WBC 9.4  HGB 14.2  PLT 199    Basename 07/17/11 0309 07/16/11 1601  NA 137 141  K 3.9 3.5  CL 101 103  CO2 26 29  GLUCOSE 397* 106*  BUN 16 15  CREATININE 1.03 0.89    Basename 07/17/11 0615 07/16/11 2237  TROPONINI <0.30 <0.30   No results found for this basename: BNP in the last 72 hours Hepatic Function Panel  Basename 07/16/11 1601  PROT 6.6  ALBUMIN  3.5  AST 20  ALT 18  ALKPHOS 61  BILITOT 0.3  BILIDIR --  IBILI --   No results found for this basename: CHOL,  HDL,  LDLCALC,  LDLDIRECT,  TRIG,  CHOLHDL    Basename 07/16/11 2237  INR 0.95   Tele:  NSR  Assessment/Plan:   Assessment/Plan  1. Ventricular fibrillation: The patient has had 2 separate episodes of ventricular fibrillation. These were caused by physical exertion with subsequent exertional angina and were presumably due to ischemia. He's been having episodes of exertional fatigue, left-sided neck pain, and left arm pain for the past several weeks.  His AICD interrogation confirms the presence of ventricular fibrillation.  Nycholas appears to be having symptoms consistent with unstable angina that lead to ventricular fibrillation. I think that our first step is to proceed with cardiac catheterization. We'll need to be revascularized if we find any significant coronary artery disease. If we are  unable to find any significant lesions for revascularization, then we'll need to consider Amiodarone. We'll ask the electrophysiologists for help with these issues.  The patient would rather have his workup at the [facility] if at all possible.  [facility] attempting to speak with the chief resident but go no answer after holding for 20 minutes.  Care manager will attempt to contact [facility] today.  2. Coronary artery disease: The patient clearly has symptoms of unstable angina. He's having angina like symptoms with minimal exertion such as walking up a hill and pulling weeds. I think he needs repeat cardiac catheterization. As noted above, he would [facility]. We can certainly provided his service here if he would like. He is currently pain-free. We'll get cardiac enzymes to rule out myocardial in origin. It would not be surprising if his enzymes are slightly elevated because of the AICD Discharge.   Will place pre cath orders today.  3. Diabetes mellitus: We'll continue with the patient's diabetic medicines. We'll check CBGs 4 times a day. Further management will be per his medical doctor.   4. Hypercholesterolemia: the patient is currently on Lipitor. We will substitute simvastatin while he is in-house but that he would need to be discharged on his home dose of Lipitor. He has significant muscle aches with Crestor.   Vesta Mixer, Montez Hageman., MD, [facility] 07/18/2011, 7:32 AM

## 2011-07-18 NOTE — Consult Note (Signed)
CARDIOTHORACIC SURGERY CONSULTATION REPORT  PCP is at [facility] Attending physician is Elyn Aquas., MD Referring Provider is Veverly Fells. Excell Seltzer, MD   Reason for consultation:  Severe 3-vessel coronary artery disease  HPI:  Patient is a 65 year old obese white male who is a service connected disabled American veteran who typically gets his care through the [place] medical both at the [facility] and [facility].  The patient has known history of coronary artery disease with previous myocardial infarction more than 15 years ago. He he apparently had a second heart attack several years later and underwent PCI and stenting at that time. Records of his previous coronary interventions are not available. The patient also has history of an implantable defibrillator. This was placed after he sustained a cardiac arrest following a prep for upper GI endoscopy. His defibrillator is followed regularly at the [facility].  The patient reports that just over one month ago he had an episode where he developed discomfort in his chest radiating to his neck and down his arm associated with shortness of breath while he was out hunting. His defibrillator fired. Since then he has not felt well and he admits to worsened exertional shortness of breath and decreased energy intermittent episodes of exertional chest discomfort. 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 He again developed typical discomfort on the left side of his neck and chest associated with shortness of breath. He became dizzy and his defibrillator fired. He blacked out but will rapidly came back consciousness. His wife was present and summoned EMS. The patient was brought to [facility] for further management.  Upon arrival in the emergency department interrogation of the patient's defibrillator revealed that the patient had an appropriate shock for ventricular fibrillation. He was  admitted to the hospital and subsequently ruled out for acute myocardial infarction.  The patient underwent elective cardiac catheterization earlier today by Dr. Excell Seltzer. He was found to have severe three-vessel coronary artery disease with mild left ventricular dysfunction. There is some question about the possibility of mild aortic stenosis. Cardiothoracic surgical consultation was requested.  Past Medical History  Diagnosis Date  . Myocardial infarction   . Ventricular tachycardia   . Diabetes mellitus   . COPD (chronic obstructive pulmonary disease)     Past Surgical History  Procedure Date  . Pacemaker insertion   . Nose surgery     skin cancer  . Cardiac catheterization   . Coronary angioplasty     No family history on file.  Social History History  Substance Use Topics  . Smoking status: Current Everyday Smoker -- 1.0 packs/day  . Smokeless tobacco: Not on file  . Alcohol Use: No    Current Facility-Administered Medications  Medication Dose Route Frequency Provider Last Rate Last Dose  . 0.9 %  sodium chloride infusion  250 mL Intravenous PRN Darrol Jump, PA   250 mL at 07/17/11 1500  . acetaminophen (TYLENOL) tablet 650 mg  650 mg Oral Q4H PRN Micheline Chapman, MD      . adenosine (ADENOCARD) 12 MG/4ML injection 48 mg  16 mL Intravenous Once Micheline Chapman, MD      . albuterol (PROVENTIL HFA;VENTOLIN HFA) 108 (90 BASE) MCG/ACT inhaler 2 puff  2 puff Inhalation Q6H PRN Darrol Jump, PA      . ALPRAZolam Prudy Feeler) tablet 0.25 mg  0.25 mg Oral BID  PRN Darrol Jump, PA      . ammonium lactate (LAC-HYDRIN) 12 % lotion   Topical Daily PRN Christella Hartigan, PHARMD      . aspirin EC tablet 81 mg  81 mg Oral Daily Hilario Quarry Amend, PHARMD   81 mg at 07/18/11 0957  . aspirin EC tablet 81 mg  81 mg Oral Daily Hilario Quarry Amend, [place]      . atenolol (TENORMIN) tablet 100 mg  100 mg Oral Daily Darrol Jump, PA   100 mg at 07/18/11 0959  . bivalirudin  (ANGIOMAX) 250 MG injection           . budesonide-formoterol (SYMBICORT) 160-4.5 MCG/ACT inhaler 2 puff  2 puff Inhalation BID Darrol Jump, PA   2 puff at 07/18/11 0832  . cholecalciferol (D-VI-SOL) 400 UNIT/ML oral liquid 400 Units  400 Units Oral Daily Darrol Jump, PA   400 Units at 07/17/11 1218  . diazepam (VALIUM) tablet 5 mg  5 mg Oral On Call Elyn Aquas., MD   5 mg at 07/18/11 1055  . fentaNYL (SUBLIMAZE) 0.05 MG/ML injection           . fluticasone (FLONASE) 50 MCG/ACT nasal spray 2 spray  2 spray Each Nare Daily Elyn Aquas., MD   2 spray at 07/18/11 [phone]  . gabapentin (NEURONTIN) capsule 100 mg  100 mg Oral TID Darrol Jump, PA   100 mg at 07/18/11 1000  . heparin 1000 UNIT/ML injection           . heparin 2-0.9 UNIT/ML-% infusion           . heparin ADULT infusion 100 units/ml (25000 units/250 ml)  1,800 Units/hr Intravenous Continuous Laurena Bering, PHARMD   1,800 Units at 07/18/11 0800  . HYDROcodone-acetaminophen (NORCO) 10-325 MG per tablet 1 tablet  1 tablet Oral Q6H PRN Darrol Jump, PA      . insulin aspart (novoLOG) injection 0-15 Units  0-15 Units Subcutaneous TID WC Elyn Aquas., MD   5 Units at 07/18/11 [phone]  . insulin aspart (novoLOG) injection 0-5 Units  0-5 Units Subcutaneous QHS Elyn Aquas., MD      . insulin aspart (novoLOG) injection 12 Units  12 Units Subcutaneous TID AC Darrol Jump, PA   12 Units at 07/17/11 1750  . insulin glargine (LANTUS) injection 56 Units  56 Units Subcutaneous QHS Darrol Jump, PA   56 Units at 07/17/11 2134  . lidocaine (XYLOCAINE) 1 % injection           . midazolam (VERSED) 2 MG/2ML injection           . nicotine (NICODERM CQ - dosed in mg/24 hours) patch 14 mg  14 mg Transdermal Daily Dayna N Dunn, PA   14 mg at 07/17/11 2049  . nitroGLYCERIN (NITROSTAT) SL tablet 0.4 mg  0.4 mg Sublingual Q5 min PRN Darrol Jump, PA      . nitroGLYCERIN (NTG ON-CALL) 0.2 mg/mL injection            . ondansetron (ZOFRAN) injection 4 mg  4 mg Intravenous Q6H PRN Micheline Chapman, MD      . simvastatin (ZOCOR) tablet 20 mg  20 mg Oral QHS Darrol Jump, PA   20 mg at 07/17/11 2134  . sodium chloride 0.9 % injection 3 mL  3 mL Intravenous Q12H Darrol Jump, PA   3 mL at 07/18/11 0959  .  sodium chloride 0.9 % injection 3 mL  3 mL Intravenous PRN Darrol Jump, PA      . verapamil (ISOPTIN) 2.5 MG/ML injection           . zolpidem (AMBIEN) tablet 5 mg  5 mg Oral QHS PRN Darrol Jump, PA      . DISCONTD: 0.9 %  sodium chloride infusion  250 mL Intravenous PRN Elyn Aquas., MD      . DISCONTD: 0.9 %  sodium chloride infusion   Intravenous Continuous Elyn Aquas., MD 100 mL/hr at 07/18/11 0843 100 mL at 07/18/11 0843  . DISCONTD: acetaminophen (TYLENOL) tablet 650 mg  650 mg Oral Q4H PRN Darrol Jump, PA      . DISCONTD: aspirin chewable tablet 324 mg  324 mg Oral Pre-Cath Elyn Aquas., MD      . DISCONTD: ondansetron [facility]) injection 4 mg  4 mg Intravenous Q6H PRN Darrol Jump, PA      . DISCONTD: sodium chloride 0.9 % injection 3 mL  3 mL Intravenous Q12H Elyn Aquas., MD      . DISCONTD: sodium chloride 0.9 % injection 3 mL  3 mL Intravenous PRN Elyn Aquas., MD        Allergies  Allergen Reactions  . Novocain Other (See Comments)    unknown  . Rosuvastatin Other (See Comments)    Muscle pain    Review of Systems:  General:  normal appetite, decreased energy   Respiratory:  persistent cough for the past month or so often productive of greenish sputum, no wheezing, no hemoptysis, no pain with inspiration or cough, exertional shortness of breath   Cardiac:  exertional chest pain or tightness and exertional SOB, no resting SOB, no PND, no orthopnea, no LE edema, no palpitations, no syncope  GI:   no difficulty swallowing, no hematochezia, no hematemesis, no melena, no constipation, no diarrhea, symptoms of reflux well controlled on  antacid therapy  GU:   no dysuria, no urgency, no frequency   Musculoskeletal: mild arthritis in hands, no arthralgia   Vascular:  no pain suggestive of claudication  Neuro:   no symptoms suggestive of TIA's, no seizures, no headaches, + peripheral neuropathy   Endocrine:  Poorly controlled type II diabetes with admitted dietary non-compliance  HEENT:  no loose teeth or painful teeth,  no recent vision changes  Psych:   no anxiety, no depression    Physical Exam:   BP 139/68  Pulse 63  Temp(Src) 97.5 F (36.4 C) (Oral)  Resp 17  Ht 6' (1.829 m)  Wt 95.5 kg (210 lb 8.6 oz)  BMI 28.55 kg/m2  SpO2 97%  General:  Obese,  ill-appearing  HEENT:  Unremarkable   Neck:   no JVD, + bruits, no adenopathy   Chest:   clear to auscultation, symmetrical breath sounds, no wheezes, no rhonchi   CV:   RRR, grade III/VI systolic murmur heard best at sternal border  Abdomen:  soft, non-tender, no masses   Extremities:  warm, well-perfused, pulses diminished but palpable  Rectal/GU  Deferred  Neuro:   Grossly non-focal and symmetrical throughout  Skin:   Clean and dry, no rashes, no breakdown  Diagnostic Tests:  Cardiac catheterization performed earlier today is reviewed. This demonstrates severe three-vessel coronary artery disease with mild left ventricular dysfunction. There is diffuse coronary artery disease throughout with findings consistent with long-standing poorly controlled diabetes mellitus.  There is heavy calcification in the  proximal left anterior descending coronary artery with 75% stenosis at the origin the vessel and 75% in the proximal vessel. There is a high obtuse marginal branch of the left circumflex coronary artery which has 70% ostial stenosis. There is right dominant coronary circulation. There is 90% proximal stenosis of the right coronary artery. There is 75% in-stent restenosis in the distal right coronary artery. There is diffuse disease in the terminal branches of the right  coronary artery. Left ventricular ejection fraction is estimated 55%. There is no mention of a gradient across the aortic valve although there is felt to be probable mild aortic stenosis.  Chest x-ray performed yesterday reveals haziness in the right lung base suspicious for possible pneumonia.  Impression:  The patient has severe three-vessel coronary artery disease with mild left ventricular dysfunction associated with recent development of symptoms of classical angina pectoris and exertional shortness of breath. The patient also has had his defibrillator fire on 2 occasions for sustained ventricular fibrillation that occurred in the setting of physical exertion. I completely agree that the patient needs coronary artery bypass grafting as the best means for treating his ischemic heart disease. He needs at least a transthoracic echocardiogram to evaluate whether or not he has significant aortic stenosis which might need to be addressed at the time of surgery. The patient needs pulmonary function tests and I suspect he may have significant chronic obstructive pulmonary disease. He gives a history of a month long history of productive cough and chest x-ray is suspicious for possible ongoing pneumonia. Finally, the patient has poorly controlled diabetes mellitus and ongoing tobacco abuse. These chronic medical conditions need to be addressed or his long-term prognosis will be poor even after successful surgical revascularization.  Plan:  I discussed matters at length with the patient and his family at the bedside this afternoon. Alternative treatment strategies been discussed in detail. The patient understands and accepts all potential associated risks of surgery including but not limited to risk of death, stroke, myocardial infarction, congestive heart failure, respiratory failure, pneumonia, bleeding requiring blood transfusion, arrhythmia, infection, and late recurrence of coronary artery disease. We  discussed the need for him to find a way to stop smoking completely. We also discussed how important will be for him to find a way to bring his diabetes under control. All his questions been addressed. We will await results of transthoracic echocardiogram and obtain pulmonary function tests. We will get a repeat chest x-ray in the morning and consider antibiotic therapy for possible pneumonia. We could potentially proceed with surgery next week if the patient is not felt to have ongoing pneumonia that should require significant delay.    Salvatore Decent. Cornelius Moras, MD

## 2011-07-18 NOTE — Consult Note (Signed)
ANTICOAGULATION CONSULT NOTE - Follow Up Consult  Pharmacy Consult for Heparin Indication: Chest pain  Allergies  Allergen Reactions  . Novocain Other (See Comments)    unknown  . Rosuvastatin Other (See Comments)    Muscle pain    Patient Measurements: Height: 6' (182.9 cm) Weight: 210 lb 8.6 oz (95.5 kg) IBW/kg (Calculated) : 77.6   Vital Signs: Temp: 97.5 F (36.4 C) (11/29 0825) Temp src: Oral (11/29 1800) BP: 139/68 mmHg (11/29 1845) Pulse Rate: 63  (11/29 1845)  Labs:  Basename 07/18/11 0915 07/18/11 0358 07/17/11 1235 07/17/11 0615 07/17/11 0309 07/16/11 2237 07/16/11 1601  HGB 15.8 -- -- -- -- -- 14.2  HCT 45.5 -- -- -- -- -- 42.1  PLT 192 -- -- -- -- -- 199  APTT -- -- -- -- -- 44* --  LABPROT -- -- -- -- -- 12.9 --  INR -- -- -- -- -- 0.95 --  HEPARINUNFRC -- 0.59 0.55 -- 0.10* -- --  CREATININE 0.73 -- -- -- 1.03 -- 0.89  CKTOTAL -- -- -- 195 -- 227 305*  CKMB -- -- -- 5.8* -- 6.3* 7.8*  TROPONINI -- -- -- <0.30 -- <0.30 <0.30   Estimated Creatinine Clearance: 107.5 ml/min (by C-G formula based on Cr of 0.73).   Medications:  Scheduled:    . adenosine  16 mL Intravenous Once  . aspirin EC  81 mg Oral Daily  . aspirin EC  81 mg Oral Daily  . atenolol  100 mg Oral Daily  . bivalirudin      . budesonide-formoterol  2 puff Inhalation BID  . cholecalciferol  400 Units Oral Daily  . diazepam  5 mg Oral On Call  . fentaNYL      . fluticasone  2 spray Each Nare Daily  . gabapentin  100 mg Oral TID  . heparin      . heparin      . insulin aspart  0-15 Units Subcutaneous TID WC  . insulin aspart  0-5 Units Subcutaneous QHS  . insulin aspart  12 Units Subcutaneous TID AC  . insulin glargine  56 Units Subcutaneous QHS  . lidocaine      . midazolam      . nicotine  14 mg Transdermal Daily  . nitroGLYCERIN      . simvastatin  20 mg Oral QHS  . sodium chloride  3 mL Intravenous Q12H  . verapamil      . DISCONTD: aspirin  324 mg Oral Pre-Cath  .  DISCONTD: sodium chloride  3 mL Intravenous Q12H    Assessment: S/P Cath.  Patient has severe multivessel CAD.   Cardiac surgery to evaluate for CABG.  Goal of Therapy:  Heparin level 0.3-0.7 units/ml   Plan:  Begin Heparin at 1800 units/hr. Next Heparin level in 6 hours.  Anikin Prosser, Elisha Headland, Pharm.D. 07/18/2011 7:06 PM

## 2011-07-18 NOTE — Procedures (Signed)
Cardiac Catheterization Procedure Note  Name: Sean [name] MRN: [id number] DOB: [birth year]  Procedure: Left Heart Cath, Selective Coronary Angiography, LV angiography, pressure wire analysis of the LAD  Indication: 65 year old gentleman with type 1 diabetes, CAD, who presented with unstable angina. He has an AICD that discharged appropriately for ventricular fibrillation. He presents for cardiac cath.   Procedural Details: The right wrist was prepped, draped, and anesthetized with 1% lidocaine. Using the modified Seldinger technique, a 5 French sheath was introduced into the right radial artery. 3 mg of verapamil was administered through the sheath, weight-based unfractionated heparin was administered intravenously. Standard Judkins catheters were used for selective coronary angiography and left ventriculography.  The aortic valve was difficult to cross. I used an AL-1 catheter and exchange length straight tip wire. The AL-1 catheter was then changed out for a pigtail catheter. Following left ventriculography, I elected to proceed with pressure wire analysis of the LAD. It was clear that the right coronary artery had severe stenosis. The LAD had moderately severe stenosis and I felt that if this was of hemodynamic significance the patient would benefit from coronary bypass surgery. Weight-based Angiomax was started for anticoagulation.  the radial sheath was upsized to a 6 [place]. An XB LAD 3.5 cm guide catheter was inserted.wants a therapeutic ACT was achieved, the pressure wire was advanced beyond the proximal lesion without difficulty. The resting pressure gradient was 0.77 which was already of hemodynamic significance without administering adenosine. It was clear the LAD with was highly significant at that point and I elected to discontinue the procedure. Catheter exchanges were performed over an exchange length guidewire. There were no immediate procedural complications. A TR band was used for radial  hemostasis at the completion of the procedure.  The patient was transferred to the post catheterization recovery area for further monitoring.  Procedural Findings: Hemodynamics: AO 150/68 LV 156/14  Coronary angiography: Coronary dominance: right  Left mainstem: Very short segment with no obstructive disease. The LAD and left circumflex nearly have separate ostia.  Left anterior descending (LAD): There is heavy calcification of the proximal LAD with a 75% stenosis extending from the origin of the vessel into the proximal vessel. The LAD becomes a much larger caliber vessel as it supplies the first diagonal branch. The first diagonal is diffusely diseased. The mid LAD has mild nonobstructive disease. The distal LAD is widely patent.  Left circumflex (LCx): There is a high obtuse marginal branch that has 70% ostial stenosis. The vessel supplies a large territory and has no other significant disease. There is mild calcification throughout the proximal and midportion of that vessel. The AV groove circumflex supplies 3 other small marginal branches, all having diffuse disease and all are small in caliber.  Right coronary artery (RCA): The RCA is dominant. It is a heavily calcified vessel with a 90% proximal stenosis. There is segmental disease in the mid vessel.  There is a stented segment in the distal vessel with 75% in-stent restenosis. Distally, the vessel terminates in a PDA and a posterolateral branch, both of which are diffusely diseased.  Left ventriculography: Left ventricular systolic function is in the low-normal range, LVEF is estimated at 55%, there is no significant mitral regurgitation. The aortic valve is mildly calcified.  Final Conclusions:  1. Severe multivessel coronary artery disease with severe LAD stenosis confirmed by pressure wire analysis 2. Low normal LV systolic function with an estimated left ventricular ejection fraction of 55%. 3. Probable mild aortic  stenosis  Recommendations: Cardiac surgery  consultation for consideration of coronary bypass surgery in this patient with insulin requiring diabetes and multivessel coronary artery disease.  Tonny Bollman 07/18/2011, 1:06 PM

## 2011-07-18 NOTE — Progress Notes (Signed)
[name] with [facility]  [facility] guarantee [facility] a [facility] today.  Given his symptoms of exertional cp with minimal exertion associated with ventricular fibrillation,  he needs cath and possible PCI today.  Will keep him here and do cath this afternoon.  Sean [name], Jr., MD, FACC 07/18/2011, 10:40 AM  

## 2011-07-18 NOTE — Interval H&P Note (Signed)
History and Physical Interval Note:  07/18/2011 11:39 AM  Sean [name]  has presented today for surgery, with the diagnosis of chest pain  The various methods of treatment have been discussed with the patient and family. After consideration of risks, benefits and other options for treatment, the patient has consented to  Procedure(s): LEFT HEART CATHETERIZATION WITH CORONARY ANGIOGRAM as a surgical intervention .  The patients' history has been reviewed, patient examined, no change in status, stable for surgery.  I have reviewed the patients' chart and labs.  Questions were answered to the patient's satisfaction.     Tonny Bollman

## 2011-07-18 NOTE — Progress Notes (Signed)
1800ANTICOAGULATION CONSULT NOTE - Follow Up Consult  Pharmacy Consult for heparin Indication: [place]  Labs:  Basename 07/18/11 0358 07/17/11 1235 07/17/11 0615 07/17/11 0309 07/16/11 2237 07/16/11 1601  HGB -- -- -- -- -- 14.2  HCT -- -- -- -- -- 42.1  PLT -- -- -- -- -- 199  APTT -- -- -- -- 44* --  LABPROT -- -- -- -- 12.9 --  INR -- -- -- -- 0.95 --  HEPARINUNFRC 0.59 0.55 -- 0.10* -- --  CREATININE -- -- -- 1.03 -- 0.89  CKTOTAL -- -- 195 -- 227 305*  CKMB -- -- 5.8* -- 6.3* 7.8*  TROPONINI -- -- <0.30 -- <0.30 <0.30   Estimated Creatinine Clearance: 83.5 ml/min (by C-G formula based on Cr of 1.03).  Assessment: Heparin level 0.59 on rate of 1800 units/hr. Therapeutic HL.  No bleeding reported.  Goal of Therapy:  Heparin level 0.3-0.7 units/ml   Plan:  Continue heparin at 1800 units/hr, daily HL and CBC.  Len Childs T 07/18/2011,9:29 AM

## 2011-07-19 ENCOUNTER — Inpatient Hospital Stay (HOSPITAL_COMMUNITY): Payer: Medicare Other

## 2011-07-19 ENCOUNTER — Other Ambulatory Visit: Payer: Self-pay

## 2011-07-19 ENCOUNTER — Other Ambulatory Visit (HOSPITAL_COMMUNITY): Payer: Self-pay | Admitting: Radiology

## 2011-07-19 DIAGNOSIS — I2510 Atherosclerotic heart disease of native coronary artery without angina pectoris: Secondary | ICD-10-CM

## 2011-07-19 DIAGNOSIS — I359 Nonrheumatic aortic valve disorder, unspecified: Secondary | ICD-10-CM

## 2011-07-19 LAB — GLUCOSE, CAPILLARY: Glucose-Capillary: 258 mg/dL — ABNORMAL HIGH (ref 70–99)

## 2011-07-19 MED ORDER — PANTOPRAZOLE SODIUM 40 MG PO TBEC
40.0000 mg | DELAYED_RELEASE_TABLET | Freq: Every day | ORAL | Status: DC
  Filled 2011-07-19: qty 1

## 2011-07-19 MED ORDER — CHOLECALCIFEROL 10 MCG (400 UNIT) PO TABS
400.0000 [IU] | ORAL_TABLET | Freq: Every day | ORAL | Status: DC
Start: 2011-07-19 — End: 2011-07-23
  Administered 2011-07-19 – 2011-07-22 (×4): 400 [IU] via ORAL
  Filled 2011-07-19 (×5): qty 1

## 2011-07-19 MED ORDER — PANTOPRAZOLE SODIUM 40 MG PO TBEC
40.0000 mg | DELAYED_RELEASE_TABLET | Freq: Every day | ORAL | Status: DC
  Administered 2011-07-19 – 2011-07-21 (×2): 40 mg via ORAL
  Filled 2011-07-19: qty 1

## 2011-07-19 MED ORDER — SODIUM CHLORIDE 0.9 % IV SOLN: INTRAVENOUS | Status: AC

## 2011-07-19 MED ORDER — ALBUTEROL SULFATE (5 MG/ML) 0.5% IN NEBU
2.5000 mg | INHALATION_SOLUTION | Freq: Once | RESPIRATORY_TRACT | Status: AC
  Administered 2011-07-19: 2.5 mg via RESPIRATORY_TRACT

## 2011-07-19 MED FILL — Dextrose Inj 5%: INTRAVENOUS | Qty: 50 | Status: AC

## 2011-07-19 NOTE — Progress Notes (Signed)
*  PRELIMINARY RESULTS* Echocardiogram 2D Echocardiogram has been performed.  Clide Deutscher RDCS 07/19/2011, 11:42 AM

## 2011-07-19 NOTE — Progress Notes (Addendum)
Subjective:    Sean [name] is a 65 yo with CAD and CHF.  Admitted after his AICD fired.  Cath yesterday reveals severe CAD and he needs CABG.  No angina this am.     . aspirin EC  81 mg Oral Daily  . aspirin EC  81 mg Oral Daily  . atenolol  100 mg Oral Daily  . bivalirudin      . budesonide-formoterol  2 puff Inhalation BID  . cholecalciferol  400 Units Oral Daily  . diazepam  5 mg Oral On Call  . fentaNYL      . fluticasone  2 spray Each Nare Daily  . gabapentin  100 mg Oral TID  . heparin      . heparin      . insulin aspart  0-15 Units Subcutaneous TID WC  . insulin aspart  0-5 Units Subcutaneous QHS  . insulin aspart  12 Units Subcutaneous TID AC  . insulin glargine  56 Units Subcutaneous QHS  . lidocaine      . midazolam      . nicotine  14 mg Transdermal Daily  . nitroGLYCERIN      . simvastatin  20 mg Oral QHS  . sodium chloride  3 mL Intravenous Q12H  . verapamil      . DISCONTD: adenosine  16 mL Intravenous Once  . DISCONTD: aspirin  324 mg Oral Pre-Cath  . DISCONTD: sodium chloride  3 mL Intravenous Q12H      . sodium chloride    . heparin 19 mL/hr (07/19/11 0600)  . DISCONTD: sodium chloride 100 mL (07/18/11 0843)    Objective:  Vital Signs in the last 24 hours: Blood pressure 143/61, pulse 60, temperature 97.4 F (36.3 C), temperature source Oral, resp. rate 17, height 6' (1.829 m), weight 212 lb 11.9 oz (96.5 kg), SpO2 93.00%. Temp:  [97.2 F (36.2 C)-97.5 F (36.4 C)] 97.4 F (36.3 C) (11/30 0400) Pulse Rate:  [60-65] 60  (11/29 2000) Resp:  [15-31] 17  (11/30 0000) BP: (115-167)/(39-109) 143/61 mmHg (11/30 0400) SpO2:  [93 %-100 %] 93 % (11/30 0400) Weight:  [212 lb 11.9 oz (96.5 kg)] 212 lb 11.9 oz (96.5 kg) (11/30 0500)  Intake/Output from previous day: 11/29 0701 - 11/30 0700 In: 870.3 [P.O.:240; I.V.:630.3] Out: 2200 [Urine:2200] Intake/Output from this shift:    Physical Exam: The patient is alert and oriented x 3.  The mood and  affect are normal.   Skin: warm and dry.  Color is normal.    HEENT:   the sclera are nonicteric.  The mucous membranes are moist.  The carotids are 2+ without bruits.  There is no thyromegaly.  There is no JVD.    Lungs: clear.  The chest wall is non tender.    Heart: regular rate with a normal S1 and S2.  There are no murmurs, gallops, or rubs. The PMI is not displaced.     Abdomen: good bowel sounds.  There is no guarding or rebound.  There is no hepatosplenomegaly or tenderness.  There are no masses.   Extremities:  no clubbing, cyanosis, or edema.  The legs are without rashes.  The distal pulses are intact.   Neuro:  Cranial nerves II - XII are intact.  Motor and sensory functions are intact.     Lab Results:  [facility] 07/18/11 0915 07/16/11 1601  WBC 7.3 9.4  HGB 15.8 14.2  PLT 192 199    Basename 07/18/11 0915  07/17/11 0309  NA 137 137  K 4.2 3.9  CL 100 101  CO2 27 26  GLUCOSE 199* 397*  BUN 12 16  CREATININE 0.73 1.03    Basename 07/17/11 0615 07/16/11 2237  TROPONINI <0.30 <0.30   No results found for this basename: BNP in the last 72 hours Hepatic Function Panel  Basename 07/16/11 1601  PROT 6.6  ALBUMIN 3.5  AST 20  ALT 18  ALKPHOS 61  BILITOT 0.3  BILIDIR --  IBILI --   No results found for this basename: CHOL,  HDL,  LDLCALC,  LDLDIRECT,  TRIG,  CHOLHDL    Basename 07/16/11 2237  INR 0.95   Tele:  NSR  Assessment/Plan:   Assessment/Plan  1. Ventricular fibrillation: The patient has had 2 separate episodes of ventricular fibrillation. These were caused by physical exertion with subsequent exertional angina and were presumably due to ischemia. He's been having episodes of exertional fatigue, left-sided neck pain, and left arm pain for the past several weeks.  Cath confirms presence of severe CAD   T2. Coronary artery disease:  Cath shows severe CAD.  Needs CABG - ? Next week  3. Diabetes mellitus: We'll continue with the patient's diabetic  medicines. We'll check CBGs 4 times a day. Further management will be per his medical doctor.   4. Hypercholesterolemia: the patient is currently on Lipitor. We will substitute simvastatin while he is in-house but that he would need to be discharged on his home dose of Lipitor. He has significant muscle aches with Crestor.   Vesta Mixer, Montez Hageman., MD, [facility] 07/19/2011, 7:09 AM   In reviewing the echocardiogram, the aortic valve is moderately calcified.  He is scheduled for CABG on Tuesday.  We should get a TEE on Monday.  Orders written, npo ordered for Monday at midnight.  Vesta Mixer, Montez Hageman., MD, [facility] 07/19/2011, 4:48 PM

## 2011-07-19 NOTE — Consult Note (Signed)
Pt smokes 1 ppd plus. He is in action stage and wants to quit. His wife is also  A 1ppd smoker. She is also quitting with the pt. Recommended 21 mg patch x 6 weeks, 14 mg patch x 2 weeks and 7 mg patch x 2 weeks. Discussed patch use instructions with pt and his wife. Referred to 1-800 quit now for f/u and support. Discussed oral fixation substitutes, second hand smoke and in home smoking policy. Reviewed and gave pt Written education/contact information.

## 2011-07-19 NOTE — Consult Note (Signed)
ANTICOAGULATION CONSULT NOTE - Follow Up Consult  Pharmacy Consult for Heparin Indication: CAD  Allergies  Allergen Reactions  . Novocain Other (See Comments)    unknown  . Rosuvastatin Other (See Comments)    Muscle pain    Patient Measurements: Height: 6' (182.9 cm) Weight: 210 lb 8.6 oz (95.5 kg) IBW/kg (Calculated) : 77.6   Vital Signs: Temp: 97.4 F (36.3 C) (11/30 0400) Temp src: Oral (11/30 0400) BP: 143/61 mmHg (11/30 0400) Pulse Rate: 60  (11/29 2000)  Labs:  Alvira Philips 07/19/11 0308 07/18/11 0915 07/18/11 0358 07/17/11 1235 07/17/11 0615 07/17/11 0309 07/16/11 2237 07/16/11 1601  HGB -- 15.8 -- -- -- -- -- 14.2  HCT -- 45.5 -- -- -- -- -- 42.1  PLT -- 192 -- -- -- -- -- 199  APTT -- -- -- -- -- -- 44* --  LABPROT -- -- -- -- -- -- 12.9 --  INR -- -- -- -- -- -- 0.95 --  HEPARINUNFRC 0.28* -- 0.59 0.55 -- -- -- --  CREATININE -- 0.73 -- -- -- 1.03 -- 0.89  CKTOTAL -- -- -- -- 195 -- 227 305*  CKMB -- -- -- -- 5.8* -- 6.3* 7.8*  TROPONINI -- -- -- -- <0.30 -- <0.30 <0.30   Estimated Creatinine Clearance: 107.5 ml/min (by C-G formula based on Cr of 0.73).    Assessment: 65 yo male with  severe multivessel CAD, awaiting poss. CABG, for Heparin     Goal of Therapy:  Heparin level 0.3-0.7 units/ml   Plan:  Increase Heparin 1900 units/hr  Jenavee Laguardia, Gary Fleet, Pharm.D. 07/19/2011 5:03 AM

## 2011-07-19 NOTE — Progress Notes (Signed)
TCTS BRIEF PROGRESS NOTE   Clinically stable.  No chest pain, SOB.  Exam unchanged. Repeat CXR looks okay with no clear sign of pneumonia. PFT's done. Report pending. 2D ECHO reviewed.  Aortic valve not well visualized.  Velocity/estimated gradients across the outflow tract consistent with moderate AS.  TEE would be helpful to better decide whether or not AVR would be wise/appropriate at the time of CABG.  This could be done in the OR, but it would make counseling the patient simpler if it was done on Monday.    Will tentatively plan OR for Tuesday.  Jakarius Flamenco H 7:19 PM

## 2011-07-19 NOTE — Progress Notes (Signed)
*  PRELIMINARY RESULTS*  Pre CABG Upper Extremity and ABI's have been performed. Right ABI = 1.05. Left ABI- 1.01 Palmar Arch Evaluation = Right waveform decreases >50% with radial and ulnar compression. Left waveform remains normal with radial compression and obliterated with ulnar compression.  Farrel Demark 07/19/2011, 4:20 PM

## 2011-07-20 DIAGNOSIS — I2510 Atherosclerotic heart disease of native coronary artery without angina pectoris: Secondary | ICD-10-CM

## 2011-07-20 DIAGNOSIS — I350 Nonrheumatic aortic (valve) stenosis: Secondary | ICD-10-CM

## 2011-07-20 DIAGNOSIS — Z01810 Encounter for preprocedural cardiovascular examination: Secondary | ICD-10-CM

## 2011-07-20 HISTORY — DX: Atherosclerotic heart disease of native coronary artery without angina pectoris: I25.10

## 2011-07-20 HISTORY — DX: Nonrheumatic aortic (valve) stenosis: I35.0

## 2011-07-20 LAB — CBC
MCV: 89.7 fL (ref 78.0–100.0)
Platelets: 204 10*3/uL (ref 150–400)
RBC: 5.16 MIL/uL (ref 4.22–5.81)
WBC: 8.5 10*3/uL (ref 4.0–10.5)

## 2011-07-20 LAB — GLUCOSE, CAPILLARY
Glucose-Capillary: 253 mg/dL — ABNORMAL HIGH (ref 70–99)
Glucose-Capillary: 304 mg/dL — ABNORMAL HIGH (ref 70–99)

## 2011-07-20 MED ORDER — HEPARIN SOD (PORCINE) IN D5W 100 UNIT/ML IV SOLN
1750.0000 [IU]/h | INTRAVENOUS | Status: DC
  Administered 2011-07-20 – 2011-07-21 (×3): 1800 [IU]/h via INTRAVENOUS
  Administered 2011-07-22: 1750 [IU]/h via INTRAVENOUS
  Filled 2011-07-20 (×6): qty 250

## 2011-07-20 NOTE — Progress Notes (Signed)
Subjective:    Nathan is a 65 yo with CAD and CHF.  Admitted after his AICD fired.  Cath yesterday reveals severe CAD and he needs CABG.  No angina this am.  Cardiac catheterization performed revealed severe coronary artery disease that will require coronary artery bypass grafting. His echocardiogram performed on November 30 reveals a moderately calcified aortic valve with mild aortic stenosis and aortic insufficiency. He is scheduled for a transesophageal echo on Monday to further evaluate the aortic valve.    Marland Kitchen aspirin EC  81 mg Oral Daily  . atenolol  100 mg Oral Daily  . budesonide-formoterol  2 puff Inhalation BID  . cholecalciferol  400 Units Oral Daily  . fluticasone  2 spray Each Nare Daily  . gabapentin  100 mg Oral TID  . insulin aspart  0-15 Units Subcutaneous TID WC  . insulin aspart  0-5 Units Subcutaneous QHS  . insulin aspart  12 Units Subcutaneous TID AC  . insulin glargine  56 Units Subcutaneous QHS  . nicotine  14 mg Transdermal Daily  . pantoprazole  40 mg Oral QAC breakfast  . simvastatin  20 mg Oral QHS  . sodium chloride  3 mL Intravenous Q12H  . DISCONTD: pantoprazole  40 mg Oral QAC breakfast      . sodium chloride    . heparin 1,800 Units/hr (07/20/11 1032)  . DISCONTD: heparin 19 mL/hr (07/20/11 0600)    Objective:  Vital Signs in the last 24 hours: Blood pressure 124/50, pulse 68, temperature 97.3 F (36.3 C), temperature source Oral, resp. rate 24, height 6' (1.829 m), weight 210 lb 5.1 oz (95.4 kg), SpO2 94.00%. Temp:  [97.3 F (36.3 C)-97.7 F (36.5 C)] 97.3 F (36.3 C) (12/01 0819) Pulse Rate:  [62-68] 68  (11/30 2141) Resp:  [17-24] 24  (12/01 0400) BP: (124-142)/(50-64) 124/50 mmHg (12/01 0400) SpO2:  [94 %-96 %] 94 % (12/01 0400) Weight:  [210 lb 5.1 oz (95.4 kg)] 210 lb 5.1 oz (95.4 kg) (12/01 0500)  Intake/Output from previous day: 11/30 0701 - 12/01 0700 In: 1498 [P.O.:840; I.V.:658] Out: 1580 [Urine:1580] Intake/Output from  this shift:    Physical Exam: The patient is alert and oriented x 3.  The mood and affect are normal.   Skin: warm and dry.  Color is normal.    HEENT:   the sclera are nonicteric.  The mucous membranes are moist.  The carotids are 2+ without bruits.  There is no thyromegaly.  There is no JVD.    Lungs: clear.  The chest wall is non tender.    Heart: regular rate with a normal S1 and S2. There is a very soft systolic murmur at the left sternal border.. The PMI is not displaced.     Abdomen: good bowel sounds.  There is no guarding or rebound.  There is no hepatosplenomegaly or tenderness.  There are no masses.   Extremities:  no clubbing, cyanosis, or edema.  The legs are without rashes.  The distal pulses are intact.   Neuro:  Cranial nerves II - XII are intact.  Motor and sensory functions are intact.     Lab Results:  Basename 07/20/11 0700 07/18/11 0915  WBC 8.5 7.3  HGB 15.7 15.8  PLT 204 192    Basename 07/18/11 0915  NA 137  K 4.2  CL 100  CO2 27  GLUCOSE 199*  BUN 12  CREATININE 0.73   No results found for this basename: TROPONINI:2,CK,MB:2 in  the last 72 hours No results found for this basename: BNP in the last 72 hours Hepatic Function Panel No results found for this basename: PROT,ALBUMIN,AST,ALT,ALKPHOS,BILITOT,BILIDIR,IBILI in the last 72 hours No results found for this basename: CHOL,  HDL,  LDLCALC,  LDLDIRECT,  TRIG,  CHOLHDL   No results found for this basename: INR in the last 72 hours Tele:  NSR  Assessment/Plan:   Assessment/Plan  1. Ventricular fibrillation: The patient has had 2 separate episodes of ventricular fibrillation. These were caused by physical exertion with subsequent exertional angina and were presumably due to ischemia. He's been having episodes of exertional fatigue, left-sided neck pain, and left arm pain for the past several weeks.  Cath confirms presence of severe CAD  T2. Coronary artery disease:  Cath shows severe CAD.  Needs  CABG - ? Next week  3. Diabetes mellitus: We'll continue with the patient's diabetic medicines. We'll check CBGs 4 times a day. Further management will be per his medical doctor.   4. Hypercholesterolemia: the patient is currently on Lipitor. We will substitute simvastatin while he is in-house but that he would need to be discharged on his home dose of Lipitor. He has significant muscle aches with Crestor.  5. Aortic valve disease: the echocardiogram reveals at least mild to moderate aortic stenosis and mild aortic insufficiency. We will get a transesophageal echo for further evaluation of his aortic valve.  6. Left common iliac artery aneurysm:  The patient's daughter brought some reports from the [facility]. The patient has a left common iliac artery aneurysm that has been stable. I don't think this will interfere with his surgery.   Vesta Mixer, Montez Hageman., MD, [facility] 07/20/2011, 10:35 AM

## 2011-07-20 NOTE — Consult Note (Signed)
ANTICOAGULATION CONSULT NOTE - Follow Up Consult  Pharmacy Consult for Heparin Indication: CAD  Allergen  . Novocain  . Rosuvastatin--Muscle pain    Patient Measurements: Height: 6'  Weight: 210 lb 5.1 oz (95.4 kg) IBW/kg (Calculated) : 77.6 kg  Vital Signs: Temp: 97.3 F  Temp src: Oral  BP: 124/50 mmHg    Assessment: 65 yo male with  severe multivessel CAD, awaiting poss. CABG.  He is without noted bleeding complications with heparin therapy.  His Xa level today is 0.64 units/ml, which is the upper end of the goal range.  His CBC is stable.  Basename 07/20/11 0700 07/19/11 0308 07/18/11 0915 07/18/11 0358  HEPARIN Xa 0.64 0.28* -- 0.59   Basename 07/20/11 0700 07/19/11 0308 07/18/11 0915 07/18/11 0358  HGB 15.7 -- 15.8 --  HCT 46.3 -- 45.5 --  PLT 204 -- 192 --      Goal of Therapy:  Heparin level 0.3-0.7 units/ml   Plan:  Will decrease IV heparin back to 1800 units/hr to avoid overshooting target goal. Monitor closely for s/s of bleeding.  Nadara Mustard [place], [place].D. 07/20/2011 9:47 AM

## 2011-07-20 NOTE — Progress Notes (Signed)
Bilateral carotid artery duplex completed at 09:05.  Preliminary report is no evidence of significant ICA stenosis. Smiley Houseman 07/20/2011, 2:35 PM

## 2011-07-21 LAB — GLUCOSE, CAPILLARY: Glucose-Capillary: 273 mg/dL — ABNORMAL HIGH (ref 70–99)

## 2011-07-21 LAB — CBC
Hemoglobin: 14.5 g/dL (ref 13.0–17.0)
Platelets: 185 10*3/uL (ref 150–400)
RBC: 4.74 MIL/uL (ref 4.22–5.81)

## 2011-07-21 LAB — HEPARIN LEVEL (UNFRACTIONATED): Heparin Unfractionated: 0.42 IU/mL (ref 0.30–0.70)

## 2011-07-21 MED ORDER — PANTOPRAZOLE SODIUM 40 MG PO TBEC: 40.0000 mg | DELAYED_RELEASE_TABLET | Freq: Every day | ORAL | Status: DC

## 2011-07-21 MED ORDER — PANTOPRAZOLE SODIUM 40 MG PO TBEC
40.0000 mg | DELAYED_RELEASE_TABLET | Freq: Two times a day (BID) | ORAL | Status: DC
  Administered 2011-07-22 (×3): 40 mg via ORAL
  Filled 2011-07-21 (×3): qty 1

## 2011-07-21 NOTE — Progress Notes (Signed)
Subjective:    Sean [name] is a 65 yo with CAD and CHF.  Admitted after his AICD fired.  Cath yesterday reveals severe CAD and he needs CABG.  No angina this am.  Cardiac catheterization performed revealed severe coronary artery disease that will require coronary artery bypass grafting. His echocardiogram performed on November 30 reveals a moderately calcified aortic valve with mild aortic stenosis and aortic insufficiency. He is scheduled for a transesophageal echo on Monday to further evaluate the aortic valve.    Marland Kitchen aspirin EC  81 mg Oral Daily  . atenolol  100 mg Oral Daily  . budesonide-formoterol  2 puff Inhalation BID  . cholecalciferol  400 Units Oral Daily  . fluticasone  2 spray Each Nare Daily  . gabapentin  100 mg Oral TID  . insulin aspart  0-15 Units Subcutaneous TID WC  . insulin aspart  0-5 Units Subcutaneous QHS  . insulin aspart  12 Units Subcutaneous TID AC  . insulin glargine  56 Units Subcutaneous QHS  . nicotine  14 mg Transdermal Daily  . pantoprazole  40 mg Oral QAC breakfast  . simvastatin  20 mg Oral QHS  . sodium chloride  3 mL Intravenous Q12H      . heparin 1,800 Units/hr (07/21/11 1041)    Objective:  Vital Signs in the last 24 hours: Blood pressure 131/60, pulse 61, temperature 97.3 F (36.3 C), temperature source Tympanic, resp. rate 20, height 6' (1.829 m), weight 210 lb 5.1 oz (95.4 kg), SpO2 94.00%. Temp:  [97.3 F (36.3 C)-97.9 F (36.6 C)] 97.3 F (36.3 C) (12/02 0736) Pulse Rate:  [61-64] 61  (12/02 0736) Resp:  [18-20] 20  (12/02 0736) BP: (109-144)/(35-64) 131/60 mmHg (12/02 0736) SpO2:  [93 %-96 %] 94 % (12/02 0736)  Intake/Output from previous day: 12/01 0701 - 12/02 0700 In: 619 [I.V.:619] Out: 700 [Urine:700] Intake/Output from this shift:    Physical Exam: The patient is alert and oriented x 3.  The mood and affect are normal.   Skin: warm and dry.  Color is normal.    HEENT:   the sclera are nonicteric.  The mucous  membranes are moist.  The carotids are 2+ without bruits.  There is no thyromegaly.  There is no JVD.    Lungs: clear.  The chest wall is non tender.    Heart: regular rate with a normal S1 and S2. There is a very soft systolic murmur at the left sternal border.  The PMI is not displaced.     Abdomen: good bowel sounds.  There is no guarding or rebound.  There is no hepatosplenomegaly or tenderness.  There are no masses.   Extremities:  no clubbing, cyanosis, or edema.  The legs are without rashes.  The distal pulses are intact.   Neuro:  Cranial nerves II - XII are intact.  Motor and sensory functions are intact.     Lab Results:  Basename 07/21/11 0530 07/20/11 0700  WBC 8.2 8.5  HGB 14.5 15.7  PLT 185 204   No results found for this basename: NA:2,K:2,CL:2,CO2:2,GLUCOSE:2,BUN:2,CREATININE:2 in the last 72 hours No results found for this basename: TROPONINI:2,CK,MB:2 in the last 72 hours No results found for this basename: BNP in the last 72 hours Hepatic Function Panel No results found for this basename: PROT,ALBUMIN,AST,ALT,ALKPHOS,BILITOT,BILIDIR,IBILI in the last 72 hours No results found for this basename: CHOL,  HDL,  LDLCALC,  LDLDIRECT,  TRIG,  CHOLHDL   No results found for this  basename: INR in the last 72 hours Tele:  NSR  Assessment/Plan:   Assessment/Plan  1. Ventricular fibrillation: The patient has had 2 separate episodes of ventricular fibrillation. These were caused by physical exertion with subsequent exertional angina and were presumably due to ischemia. He's been having episodes of exertional fatigue, left-sided neck pain, and left arm pain for the past several weeks.  Cath confirms presence of severe CAD  T2. Coronary artery disease:  Cath shows severe CAD.  Needs CABG - Tuesday  3. Diabetes mellitus: We'll continue with the patient's diabetic medicines. We'll check CBGs 4 times a day. Further management will be per his medical doctor.   4.  Hypercholesterolemia: the patient is currently on Lipitor. We will substitute simvastatin while he is in-house but that he would need to be discharged on his home dose of Lipitor. He has significant muscle aches with Crestor.  5. Aortic valve disease: the echocardiogram reveals at least mild to moderate aortic stenosis and mild aortic insufficiency. We will get a transesophageal echo for further evaluation of his aortic valve.  6. Left common iliac artery aneurysm:  The patient's daughter brought some reports from the [facility]. The patient has a left common iliac artery aneurysm that has been stable. I don't think this will interfere with his surgery.   Vesta Mixer, Montez Hageman., MD, [facility] 07/21/2011, 10:48 AM

## 2011-07-21 NOTE — Plan of Care (Signed)
Problem: Phase II Progression Outcomes Goal: Anginal pain relieved Outcome: Progressing Denied c/p ,pressure and tightness this shift

## 2011-07-21 NOTE — Consult Note (Signed)
ANTICOAGULATION CONSULT NOTE - Follow Up Consult  Pharmacy Consult for Heparin Indication: CAD  Allergen  . Novocain  . Rosuvastatin--Muscle pain    Patient Measurements: Height: 6'  Weight: 210 lb 5.1 oz (95.4 kg) IBW/kg (Calculated) : 77.6 kg  Vital Signs: Temp: 97.3 F  Temp src: Oral  BP: 124/50 mmHg    Assessment: 65 yo male with  severe multivessel CAD, awaiting poss. CABG.  He is without noted bleeding complications with heparin therapy.  His Xa level today is 0.42 units/ml, which is within the desired goal range.  His CBC is stable.   Ref. Range 07/17/2011 12:35 07/18/2011 03:58 07/19/2011 03:08 07/20/2011 07:00 07/21/2011 05:30  Heparin Unfractionated Latest Range: 0.30-0.70 IU/mL 0.55 0.59 0.28 (L) 0.64 0.42       Ref. Range 12/15/2007 17:51 07/16/2011 16:01 07/18/2011 09:15 07/20/2011 07:00 07/21/2011 05:30  WBC Latest Range: 4.0-10.5 K/uL  9.4 7.3 8.5 8.2  RBC Latest Range: 4.22-5.81 MIL/uL  4.76 5.14 5.16 4.74  HGB Latest Range: 13.0-17.0 g/dL 78.2 (H) 95.6 21.3 08.6 14.5  HCT Latest Range: 39.0-52.0 % 56.0 (H) 42.1 45.5 46.3 42.3  MCV Latest Range: 78.0-100.0 fL  88.4 88.5 89.7 89.2  MCH Latest Range: 26.0-34.0 pg  29.8 30.7 30.4 30.6  MCHC Latest Range: 30.0-36.0 g/dL  57.8 46.9 62.9 52.8  RDW Latest Range: 11.5-15.5 %  13.6 13.6 13.9 14.0  Platelets Latest Range: 150-400 K/uL  199 192 204 185    Goal of Therapy:  Heparin level 0.3-0.7 units/ml   Plan:  1.  Will continue IV heparin at 1800 units/hr. 2.  Monitor closely for s/s of bleeding.  Chinita Greenland, PharmD., MS  07/21/2011 11:28 AM

## 2011-07-21 NOTE — Progress Notes (Signed)
Spoke to pt and wife. Pt has made the decision not to transfer to [place] and wants to continue his care at [facility]. States they have received confirmation from [place] that they are covering his stay at [facility]. Their [facility] contact is Sharlyne Cai [phone]. Pt feels confident in the care of his physicians performing his surgery at [facility] facility and wanted to continue under physicians post-op. VA preference to transfer from non VA facility to a [place] facility form, pt has declined to sign at this time.

## 2011-07-21 NOTE — Plan of Care (Signed)
Problem: Phase I Progression Outcomes Goal: Other Phase I Outcomes/Goals Outcome: Completed/Met Date Met:  07/21/11 Pt went for cath; cardiac surgery consult; TEE scheduled for 07/22/11 and cardiac bypass surgery scheduled for 07/23/2011  Problem: Consults Goal: Cardiac Surgery Patient Education ( See Patient Education module for education specifics.) Outcome: Completed/Met Date Met:  07/21/11 Pt and family watched Preparing for Heart Surgery Video and Recovering from Heart Surgery video; pt also taught how to use incentive spirometry

## 2011-07-22 ENCOUNTER — Encounter (HOSPITAL_COMMUNITY)
Admission: EM | Disposition: A | Discharge status: Home / Self Care | Payer: Self-pay | Source: Home / Self Care | Attending: Thoracic Surgery (Cardiothoracic Vascular Surgery)

## 2011-07-22 ENCOUNTER — Encounter (HOSPITAL_COMMUNITY): Payer: Self-pay | Admitting: Gastroenterology

## 2011-07-22 DIAGNOSIS — I4901 Ventricular fibrillation: Principal | ICD-10-CM

## 2011-07-22 DIAGNOSIS — I359 Nonrheumatic aortic valve disorder, unspecified: Secondary | ICD-10-CM

## 2011-07-22 DIAGNOSIS — I2510 Atherosclerotic heart disease of native coronary artery without angina pectoris: Secondary | ICD-10-CM

## 2011-07-22 HISTORY — PX: TEE WITHOUT CARDIOVERSION: SHX5443

## 2011-07-22 LAB — GLUCOSE, CAPILLARY
Glucose-Capillary: 215 mg/dL — ABNORMAL HIGH (ref 70–99)
Glucose-Capillary: 141 mg/dL — ABNORMAL HIGH (ref 70–99)
Glucose-Capillary: 248 mg/dL — ABNORMAL HIGH (ref 70–99)

## 2011-07-22 LAB — CBC
HCT: 42.7 % (ref 39.0–52.0)
Hemoglobin: 14.1 g/dL (ref 13.0–17.0)
MCHC: 33 g/dL (ref 30.0–36.0)
RDW: 14.1 % (ref 11.5–15.5)
WBC: 7.8 10*3/uL (ref 4.0–10.5)

## 2011-07-22 LAB — TYPE AND SCREEN: ABO/RH(D): A POS

## 2011-07-22 LAB — HEPARIN LEVEL (UNFRACTIONATED): Heparin Unfractionated: 0.68 IU/mL (ref 0.30–0.70)

## 2011-07-22 SURGERY — ECHOCARDIOGRAM, TRANSESOPHAGEAL
Anesthesia: Moderate Sedation

## 2011-07-22 MED ORDER — DEXTROSE 5 % IV SOLN
750.0000 mg | INTRAVENOUS | Status: DC
  Filled 2011-07-22 (×2): qty 750

## 2011-07-22 MED ORDER — DOPAMINE-DEXTROSE 3.2-5 MG/ML-% IV SOLN
2.0000 ug/kg/min | INTRAVENOUS | Status: DC
  Filled 2011-07-22: qty 250

## 2011-07-22 MED ORDER — SODIUM CHLORIDE 0.9 % IV SOLN
INTRAVENOUS | Status: DC
  Filled 2011-07-22: qty 40

## 2011-07-22 MED ORDER — PHENYLEPHRINE HCL 10 MG/ML IJ SOLN
30.0000 ug/min | INTRAVENOUS | Status: AC
  Administered 2011-07-23: 50 ug/min via INTRAVENOUS
  Filled 2011-07-22: qty 2

## 2011-07-22 MED ORDER — CHLORHEXIDINE GLUCONATE 4 % EX LIQD
60.0000 mL | Freq: Once | CUTANEOUS | Status: AC
  Administered 2011-07-22: 4 via TOPICAL
  Filled 2011-07-22: qty 60

## 2011-07-22 MED ORDER — FENTANYL CITRATE 0.05 MG/ML IJ SOLN
INTRAMUSCULAR | Status: DC | PRN
  Administered 2011-07-22 (×2): 25 ug via INTRAVENOUS

## 2011-07-22 MED ORDER — POTASSIUM CHLORIDE 2 MEQ/ML IV SOLN
80.0000 meq | INTRAVENOUS | Status: DC
  Filled 2011-07-22: qty 40

## 2011-07-22 MED ORDER — METOPROLOL TARTRATE 12.5 MG HALF TABLET
12.5000 mg | ORAL_TABLET | Freq: Once | ORAL | Status: AC
  Administered 2011-07-23: 12.5 mg via ORAL
  Filled 2011-07-22: qty 1

## 2011-07-22 MED ORDER — MIDAZOLAM HCL 10 MG/2ML IJ SOLN
INTRAMUSCULAR | Status: DC | PRN
  Administered 2011-07-22 (×2): 2 mg via INTRAVENOUS

## 2011-07-22 MED ORDER — ALPRAZOLAM 0.25 MG PO TABS: 0.2500 mg | ORAL_TABLET | ORAL | Status: DC | PRN

## 2011-07-22 MED ORDER — CHLORHEXIDINE GLUCONATE 4 % EX LIQD
60.0000 mL | Freq: Once | CUTANEOUS | Status: AC
  Administered 2011-07-22: 4 via TOPICAL

## 2011-07-22 MED ORDER — VANCOMYCIN HCL 1000 MG IV SOLR
1500.0000 mg | INTRAVENOUS | Status: AC
  Administered 2011-07-23: 1500 mg via INTRAVENOUS
  Filled 2011-07-22: qty 1500

## 2011-07-22 MED ORDER — EPINEPHRINE HCL 1 MG/ML IJ SOLN
0.5000 ug/min | INTRAVENOUS | Status: DC
  Filled 2011-07-22: qty 4

## 2011-07-22 MED ORDER — PLASMA-LYTE 148 IV SOLN
INTRAVENOUS | Status: AC
  Administered 2011-07-23: 08:00:00
  Filled 2011-07-22: qty 0.5

## 2011-07-22 MED ORDER — NITROGLYCERIN IN D5W 200-5 MCG/ML-% IV SOLN
2.0000 ug/min | INTRAVENOUS | Status: AC
  Administered 2011-07-23: 5 ug/min via INTRAVENOUS
  Filled 2011-07-22: qty 250

## 2011-07-22 MED ORDER — DEXTROSE 5 % IV SOLN
1.5000 g | INTRAVENOUS | Status: AC
  Administered 2011-07-23: 1.5 g via INTRAVENOUS
  Administered 2011-07-23: .75 g via INTRAVENOUS
  Filled 2011-07-22: qty 1.5

## 2011-07-22 MED ORDER — SODIUM CHLORIDE 0.9 % IV SOLN
0.1000 ug/kg/h | INTRAVENOUS | Status: AC
  Administered 2011-07-23: .2 ug/kg/h via INTRAVENOUS
  Filled 2011-07-22: qty 4

## 2011-07-22 MED ORDER — FENTANYL CITRATE 0.05 MG/ML IJ SOLN
INTRAMUSCULAR | Status: AC
  Filled 2011-07-22: qty 2

## 2011-07-22 MED ORDER — INSULIN REGULAR HUMAN 100 UNIT/ML IJ SOLN
INTRAMUSCULAR | Status: AC
  Administered 2011-07-23: 1 [IU]/h via INTRAVENOUS
  Filled 2011-07-22: qty 1

## 2011-07-22 MED ORDER — CHLORHEXIDINE GLUCONATE 4 % EX LIQD
60.0000 mL | Freq: Once | CUTANEOUS | Status: AC
  Administered 2011-07-23: 4 via TOPICAL
  Filled 2011-07-22: qty 60

## 2011-07-22 MED ORDER — MAGNESIUM SULFATE 50 % IJ SOLN
40.0000 meq | INTRAMUSCULAR | Status: DC
  Filled 2011-07-22: qty 10

## 2011-07-22 MED ORDER — MIDAZOLAM HCL 10 MG/2ML IJ SOLN
INTRAMUSCULAR | Status: AC
  Filled 2011-07-22: qty 2

## 2011-07-22 MED ORDER — TEMAZEPAM 15 MG PO CAPS: 15.0000 mg | ORAL_CAPSULE | Freq: Once | ORAL | Status: AC | PRN

## 2011-07-22 MED ORDER — BISACODYL 5 MG PO TBEC
5.0000 mg | DELAYED_RELEASE_TABLET | Freq: Once | ORAL | Status: AC
  Administered 2011-07-22: 5 mg via ORAL
  Filled 2011-07-22: qty 1

## 2011-07-22 NOTE — Progress Notes (Signed)
TCTS BRIEF PROGRESS NOTE   TEE reviewed, discussed with Dr. Jens Som and discussed at length with [name] and his family.  We plan AVR + CABG in am tomorrow using a bioprosthetic tissue valve for aortic valve replacement.  [name] and his family understand the rationale for AVR at the time of CABG.  Alternative treatment strategies have been reviewed.  Alternatives for valve replacement have been reviewed.  They understand and accept all potential associated risks and desire to proceed as planned.  All questions answered.  [name],Sean H 07/22/2011 6:09 PM

## 2011-07-22 NOTE — Progress Notes (Signed)
  Subjective:    Sean [name] is a 65 yo with CAD and CHF.  Admitted after his AICD fired.  Cath yesterday reveals severe CAD and he needs CABG.  No angina this am.  Cardiac catheterization performed revealed severe coronary artery disease that will require coronary artery bypass grafting. His echocardiogram performed on November 30 reveals a moderately calcified aortic valve with mild aortic stenosis and aortic insufficiency. He is scheduled for a transesophageal echo today to further evaluate the aortic valve.    . aspirin EC  81 mg Oral Daily  . atenolol  100 mg Oral Daily  . budesonide-formoterol  2 puff Inhalation BID  . cholecalciferol  400 Units Oral Daily  . fluticasone  2 spray Each Nare Daily  . gabapentin  100 mg Oral TID  . insulin aspart  0-15 Units Subcutaneous TID WC  . insulin aspart  0-5 Units Subcutaneous QHS  . insulin aspart  12 Units Subcutaneous TID AC  . insulin glargine  56 Units Subcutaneous QHS  . nicotine  14 mg Transdermal Daily  . pantoprazole  40 mg Oral BID  . simvastatin  20 mg Oral QHS  . sodium chloride  3 mL Intravenous Q12H  . DISCONTD: pantoprazole  40 mg Oral QAC breakfast  . DISCONTD: pantoprazole  40 mg Oral Q1200      . heparin 1,800 Units/hr (07/21/11 2348)    Objective:  Vital Signs in the last 24 hours: Blood pressure 138/53, pulse 66, temperature 97.6 F (36.4 C), temperature source Oral, resp. rate 18, height 6' (1.829 m), weight 210 lb 5.1 oz (95.4 kg), SpO2 95.00%. Temp:  [97.3 F (36.3 C)-98.1 F (36.7 C)] 97.6 F (36.4 C) (12/03 0400) Pulse Rate:  [61-69] 66  (12/02 1622) Resp:  [18-20] 18  (12/02 1622) BP: (110-138)/(39-74) 138/53 mmHg (12/03 0400) SpO2:  [93 %-98 %] 95 % (12/03 0400)  Intake/Output from previous day: 12/02 0701 - 12/03 0700 In: 672 [I.V.:672] Out: -  Intake/Output from this shift:    Physical Exam: The patient is alert and oriented x 3.  The mood and affect are normal.   Skin: warm and dry.  Color  is normal.    HEENT:   the sclera are nonicteric.  The mucous membranes are moist.  The carotids are 2+ without bruits.  There is no thyromegaly.  There is no JVD.    Lungs: clear.  The chest wall is non tender.    Heart: regular rate with a normal S1 and S2. There is a very soft systolic murmur at the left sternal border.  The PMI is not displaced.     Abdomen: good bowel sounds.  There is no guarding or rebound.  There is no hepatosplenomegaly or tenderness.  There are no masses.   Extremities:  no clubbing, cyanosis, or edema.  The legs are without rashes.  The distal pulses are intact.   Neuro:  Cranial nerves II - XII are intact.  Motor and sensory functions are intact.     Lab Results:  Basename 07/21/11 0530 07/20/11 0700  WBC 8.2 8.5  HGB 14.5 15.7  PLT 185 204   No results found for this basename: NA:2,K:2,CL:2,CO2:2,GLUCOSE:2,BUN:2,CREATININE:2 in the last 72 hours No results found for this basename: TROPONINI:2,CK,MB:2 in the last 72 hours No results found for this basename: BNP in the last 72 hours Hepatic Function Panel No results found for this basename: PROT,ALBUMIN,AST,ALT,ALKPHOS,BILITOT,BILIDIR,IBILI in the last 72 hours No results found for this basename: CHOL,    HDL,  LDLCALC,  LDLDIRECT,  TRIG,  CHOLHDL   No results found for this basename: INR in the last 72 hours Tele:  NSR  Assessment/Plan:   Assessment/Plan  1. Ventricular fibrillation: The patient has had 2 separate episodes of ventricular fibrillation. These were caused by physical exertion with subsequent exertional angina and were presumably due to ischemia. He's been having episodes of exertional fatigue, left-sided neck pain, and left arm pain for the past several weeks.  Cath confirms presence of severe CAD  T2. Coronary artery disease:  Cath shows severe CAD.  Needs CABG - Tuesday  3. Diabetes mellitus: We'll continue with the patient's diabetic medicines. We'll check CBGs 4 times a day. Further  management will be per his medical doctor.   4. Hypercholesterolemia: the patient is currently on Lipitor. We will substitute simvastatin while he is in-house but that he would need to be discharged on his home dose of Lipitor. He has significant muscle aches with Crestor.  5. Aortic valve disease: the echocardiogram reveals at least mild to moderate aortic stenosis and mild aortic insufficiency. We will get a transesophageal echo for further evaluation of his aortic valve.  6. Left common iliac artery aneurysm:  The patient's daughter brought some reports from the [facility]. The patient has a left common iliac artery aneurysm that has been stable. I don't think this will interfere with his surgery.   Sean [name], Jr., MD, FACC 07/22/2011, 7:10 AM     

## 2011-07-22 NOTE — Progress Notes (Signed)
PHARMACY - ANTICOAGULATION CONSULT FOLLOW-UP NOTE  Pharmacy Consult for: Heparin  Indication: CAD   Patient Data:   Allergies: Allergies  Allergen Reactions  . Novocain Other (See Comments)    unknown  . Rosuvastatin Other (See Comments)    Muscle pain    Patient Measurements: Height: 6' (182.9 cm) Weight: 210 lb 5.1 oz (95.4 kg) IBW/kg (Calculated) : 77.6  Adjusted Body Weight: 95.4 kg  Vital Signs: Temp:  [97.5 F (36.4 C)-98.1 F (36.7 C)] 97.5 F (36.4 C) (12/03 0800) Pulse Rate:  [59-69] 59  (12/03 0800) Resp:  [18] 18  (12/03 0800) BP: (110-138)/(39-74) 122/62 mmHg (12/03 0800) SpO2:  [93 %-98 %] 98 % (12/03 0800)  Intake/Output from previous day:  Intake/Output Summary (Last 24 hours) at 07/22/11 0840 Last data filed at 07/22/11 0800  Gross per 24 hour  Intake    672 ml  Output      0 ml  Net    672 ml    Labs:  Basename 07/22/11 0720 07/21/11 0530 07/20/11 0700  HGB 14.1 14.5 --  HCT 42.7 42.3 46.3  PLT 193 185 204  APTT -- -- --  LABPROT -- -- --  INR -- -- --  HEPARINUNFRC 0.68 0.42 0.64  CREATININE -- -- --  CKTOTAL -- -- --  CKMB -- -- --  TROPONINI -- -- --   Estimated Creatinine Clearance: 107.3 ml/min (by C-G formula based on Cr of 0.73).  Scheduled Medications:     . aspirin EC  81 mg Oral Daily  . atenolol  100 mg Oral Daily  . budesonide-formoterol  2 puff Inhalation BID  . cholecalciferol  400 Units Oral Daily  . fluticasone  2 spray Each Nare Daily  . gabapentin  100 mg Oral TID  . insulin aspart  0-15 Units Subcutaneous TID WC  . insulin aspart  0-5 Units Subcutaneous QHS  . insulin aspart  12 Units Subcutaneous TID AC  . insulin glargine  56 Units Subcutaneous QHS  . nicotine  14 mg Transdermal Daily  . pantoprazole  40 mg Oral BID  . simvastatin  20 mg Oral QHS  . sodium chloride  3 mL Intravenous Q12H  . DISCONTD: pantoprazole  40 mg Oral QAC breakfast  . DISCONTD: pantoprazole  40 mg Oral Q1200     Assessment:    65 y.o. male was admitted on 07/16/2011, with severe CAD and awaiting possible CABG receiving IV heparin infusion. Heparin level is at upper end of goal-range.  Goal of Therapy:  1. Heparin level 0.3-0.7 units/ml   Plan:  1. Decrease IV heparin infusion to 1750 units/hr.  2. F/u AM HL.   Dineen Kid Ernesto Lashway, PharmD 07/22/2011, 8:40 AM

## 2011-07-22 NOTE — H&P (View-Only) (Signed)
Subjective:    Sean [name] is a 65 yo with CAD and CHF.  Admitted after his AICD fired.  Cath yesterday reveals severe CAD and he needs CABG.  No angina this am.  Cardiac catheterization performed revealed severe coronary artery disease that will require coronary artery bypass grafting. His echocardiogram performed on November 30 reveals a moderately calcified aortic valve with mild aortic stenosis and aortic insufficiency. He is scheduled for a transesophageal echo today to further evaluate the aortic valve.    Marland Kitchen aspirin EC  81 mg Oral Daily  . atenolol  100 mg Oral Daily  . budesonide-formoterol  2 puff Inhalation BID  . cholecalciferol  400 Units Oral Daily  . fluticasone  2 spray Each Nare Daily  . gabapentin  100 mg Oral TID  . insulin aspart  0-15 Units Subcutaneous TID WC  . insulin aspart  0-5 Units Subcutaneous QHS  . insulin aspart  12 Units Subcutaneous TID AC  . insulin glargine  56 Units Subcutaneous QHS  . nicotine  14 mg Transdermal Daily  . pantoprazole  40 mg Oral BID  . simvastatin  20 mg Oral QHS  . sodium chloride  3 mL Intravenous Q12H  . DISCONTD: pantoprazole  40 mg Oral QAC breakfast  . DISCONTD: pantoprazole  40 mg Oral Q1200      . heparin 1,800 Units/hr (07/21/11 2348)    Objective:  Vital Signs in the last 24 hours: Blood pressure 138/53, pulse 66, temperature 97.6 F (36.4 C), temperature source Oral, resp. rate 18, height 6' (1.829 m), weight 210 lb 5.1 oz (95.4 kg), SpO2 95.00%. Temp:  [97.3 F (36.3 C)-98.1 F (36.7 C)] 97.6 F (36.4 C) (12/03 0400) Pulse Rate:  [61-69] 66  (12/02 1622) Resp:  [18-20] 18  (12/02 1622) BP: (110-138)/(39-74) 138/53 mmHg (12/03 0400) SpO2:  [93 %-98 %] 95 % (12/03 0400)  Intake/Output from previous day: 12/02 0701 - 12/03 0700 In: 672 [I.V.:672] Out: -  Intake/Output from this shift:    Physical Exam: The patient is alert and oriented x 3.  The mood and affect are normal.   Skin: warm and dry.  Color  is normal.    HEENT:   the sclera are nonicteric.  The mucous membranes are moist.  The carotids are 2+ without bruits.  There is no thyromegaly.  There is no JVD.    Lungs: clear.  The chest wall is non tender.    Heart: regular rate with a normal S1 and S2. There is a very soft systolic murmur at the left sternal border.  The PMI is not displaced.     Abdomen: good bowel sounds.  There is no guarding or rebound.  There is no hepatosplenomegaly or tenderness.  There are no masses.   Extremities:  no clubbing, cyanosis, or edema.  The legs are without rashes.  The distal pulses are intact.   Neuro:  Cranial nerves II - XII are intact.  Motor and sensory functions are intact.     Lab Results:  Basename 07/21/11 0530 07/20/11 0700  WBC 8.2 8.5  HGB 14.5 15.7  PLT 185 204   No results found for this basename: NA:2,K:2,CL:2,CO2:2,GLUCOSE:2,BUN:2,CREATININE:2 in the last 72 hours No results found for this basename: TROPONINI:2,CK,MB:2 in the last 72 hours No results found for this basename: BNP in the last 72 hours Hepatic Function Panel No results found for this basename: PROT,ALBUMIN,AST,ALT,ALKPHOS,BILITOT,BILIDIR,IBILI in the last 72 hours No results found for this basename: CHOL,  HDL,  LDLCALC,  LDLDIRECT,  TRIG,  CHOLHDL   No results found for this basename: INR in the last 72 hours Tele:  NSR  Assessment/Plan:   Assessment/Plan  1. Ventricular fibrillation: The patient has had 2 separate episodes of ventricular fibrillation. These were caused by physical exertion with subsequent exertional angina and were presumably due to ischemia. He's been having episodes of exertional fatigue, left-sided neck pain, and left arm pain for the past several weeks.  Cath confirms presence of severe CAD  T2. Coronary artery disease:  Cath shows severe CAD.  Needs CABG - Tuesday  3. Diabetes mellitus: We'll continue with the patient's diabetic medicines. We'll check CBGs 4 times a day. Further  management will be per his medical doctor.   4. Hypercholesterolemia: the patient is currently on Lipitor. We will substitute simvastatin while he is in-house but that he would need to be discharged on his home dose of Lipitor. He has significant muscle aches with Crestor.  5. Aortic valve disease: the echocardiogram reveals at least mild to moderate aortic stenosis and mild aortic insufficiency. We will get a transesophageal echo for further evaluation of his aortic valve.  6. Left common iliac artery aneurysm:  The patient's daughter brought some reports from the [facility]. The patient has a left common iliac artery aneurysm that has been stable. I don't think this will interfere with his surgery.   Vesta Mixer, Montez Hageman., MD, [facility] 07/22/2011, 7:10 AM

## 2011-07-22 NOTE — Progress Notes (Signed)
*  PRELIMINARY RESULTS* Echocardiogram Echocardiogram Transesophageal has been performed.  Clide Deutscher RDCS 07/22/2011, 12:26 PM

## 2011-07-22 NOTE — Procedures (Signed)
See report in echo system Jenner Rosier  

## 2011-07-22 NOTE — Interval H&P Note (Signed)
History and Physical Interval Note:  07/22/2011 11:40 AM  Sean [name]  has presented today for surgery, with the diagnosis of aortic stenosis  The various methods of treatment have been discussed with the patient and family. After consideration of risks, benefits and other options for treatment, the patient has consented to  Procedure(s): TRANSESOPHAGEAL ECHOCARDIOGRAM (TEE) as a surgical intervention .  The patients' history has been reviewed, patient examined, no change in status, stable for surgery.  I have reviewed the patients' chart and labs.  Questions were answered to the patient's satisfaction.     Olga Millers  Chart reviewed; no changes Olga Millers

## 2011-07-22 NOTE — Plan of Care (Signed)
Problem: Phase II Progression Outcomes Goal: Anginal pain relieved Outcome: Progressing Denied pain,pressure,tightness,SOB,nausea and dizziness this shift

## 2011-07-22 NOTE — Progress Notes (Signed)
BACK FROM  ENDOSCOPY BY WHEELCHAIR AWAKE AND ALERT.

## 2011-07-22 NOTE — Progress Notes (Signed)
To endoscopy by wheelchair stable.

## 2011-07-23 ENCOUNTER — Other Ambulatory Visit: Payer: Self-pay | Admitting: Thoracic Surgery (Cardiothoracic Vascular Surgery)

## 2011-07-23 ENCOUNTER — Encounter (HOSPITAL_COMMUNITY)
Admission: EM | Disposition: A | Discharge status: Home / Self Care | Payer: Self-pay | Source: Home / Self Care | Attending: Thoracic Surgery (Cardiothoracic Vascular Surgery)

## 2011-07-23 ENCOUNTER — Inpatient Hospital Stay (HOSPITAL_COMMUNITY): Payer: Medicare Other

## 2011-07-23 ENCOUNTER — Encounter (HOSPITAL_COMMUNITY): Payer: Self-pay

## 2011-07-23 ENCOUNTER — Encounter (HOSPITAL_COMMUNITY): Payer: Self-pay | Admitting: Critical Care Medicine

## 2011-07-23 DIAGNOSIS — I359 Nonrheumatic aortic valve disorder, unspecified: Secondary | ICD-10-CM

## 2011-07-23 DIAGNOSIS — I2510 Atherosclerotic heart disease of native coronary artery without angina pectoris: Secondary | ICD-10-CM

## 2011-07-23 HISTORY — PX: AORTIC VALVE REPLACEMENT: SHX41

## 2011-07-23 HISTORY — PX: CORONARY ARTERY BYPASS GRAFT: SHX141

## 2011-07-23 LAB — CBC
HCT: 30 % — ABNORMAL LOW (ref 39.0–52.0)
Hemoglobin: 13.7 g/dL (ref 13.0–17.0)
Hemoglobin: 10.1 g/dL — ABNORMAL LOW (ref 13.0–17.0)
MCH: 29.9 pg (ref 26.0–34.0)
MCH: 30.1 pg (ref 26.0–34.0)
MCHC: 33.7 g/dL (ref 30.0–36.0)
MCHC: 34 g/dL (ref 30.0–36.0)
MCV: 88.4 fL (ref 78.0–100.0)
MCV: 88.5 fL (ref 78.0–100.0)
Platelets: 187 10*3/uL (ref 150–400)
RBC: 3.36 MIL/uL — ABNORMAL LOW (ref 4.22–5.81)
RBC: 3.39 MIL/uL — ABNORMAL LOW (ref 4.22–5.81)
RDW: 14.3 % (ref 11.5–15.5)
WBC: 12.3 10*3/uL — ABNORMAL HIGH (ref 4.0–10.5)

## 2011-07-23 LAB — POCT I-STAT 3, ART BLOOD GAS (G3+)
Acid-Base Excess: 1 mmol/L (ref 0.0–2.0)
Acid-Base Excess: 1 mmol/L (ref 0.0–2.0)
Bicarbonate: 28.5 mEq/L — ABNORMAL HIGH (ref 20.0–24.0)
O2 Saturation: 100 %
O2 Saturation: 100 %
O2 Saturation: 93 %
O2 Saturation: 96 %
Patient temperature: 37.4
Patient temperature: 37.3
Patient temperature: 37.3
TCO2: 30 mmol/L (ref 0–100)
TCO2: 27 mmol/L (ref 0–100)
TCO2: 30 mmol/L (ref 0–100)
pCO2 arterial: 51 mmHg — ABNORMAL HIGH (ref 35.0–45.0)
pCO2 arterial: 27.5 mmHg — ABNORMAL LOW (ref 35.0–45.0)
pCO2 arterial: 46.2 mmHg — ABNORMAL HIGH (ref 35.0–45.0)
pH, Arterial: 7.361 (ref 7.350–7.450)
pH, Arterial: 7.58 — ABNORMAL HIGH (ref 7.350–7.450)
pH, Arterial: 7.389 (ref 7.350–7.450)
pH, Arterial: 7.368 (ref 7.350–7.450)
pO2, Arterial: 222 mmHg — ABNORMAL HIGH (ref 80.0–100.0)
pO2, Arterial: 69 mmHg — ABNORMAL LOW (ref 80.0–100.0)
pO2, Arterial: 56 mmHg — ABNORMAL LOW (ref 80.0–100.0)

## 2011-07-23 LAB — POCT I-STAT 4, (NA,K, GLUC, HGB,HCT)
Glucose, Bld: 184 mg/dL — ABNORMAL HIGH (ref 70–99)
Glucose, Bld: 134 mg/dL — ABNORMAL HIGH (ref 70–99)
Glucose, Bld: 97 mg/dL (ref 70–99)
Glucose, Bld: 127 mg/dL — ABNORMAL HIGH (ref 70–99)
HCT: 39 % (ref 39.0–52.0)
HCT: 26 % — ABNORMAL LOW (ref 39.0–52.0)
HCT: 26 % — ABNORMAL LOW (ref 39.0–52.0)
HCT: 31 % — ABNORMAL LOW (ref 39.0–52.0)
Hemoglobin: 13.3 g/dL (ref 13.0–17.0)
Hemoglobin: 10.5 g/dL — ABNORMAL LOW (ref 13.0–17.0)
Potassium: 3.9 mEq/L (ref 3.5–5.1)
Potassium: 5.1 mEq/L (ref 3.5–5.1)
Potassium: 4.1 mEq/L (ref 3.5–5.1)
Sodium: 139 mEq/L (ref 135–145)
Sodium: 139 mEq/L (ref 135–145)
Sodium: 138 mEq/L (ref 135–145)

## 2011-07-23 LAB — BASIC METABOLIC PANEL
CO2: 25 mEq/L (ref 19–32)
Calcium: 9.2 mg/dL (ref 8.4–10.5)
GFR calc non Af Amer: 87 mL/min — ABNORMAL LOW (ref >90)
Glucose, Bld: 231 mg/dL — ABNORMAL HIGH (ref 70–99)
Potassium: 3.6 mEq/L (ref 3.5–5.1)
Sodium: 137 mEq/L (ref 135–145)

## 2011-07-23 LAB — GLUCOSE, CAPILLARY: Glucose-Capillary: 225 mg/dL — ABNORMAL HIGH (ref 70–99)

## 2011-07-23 LAB — POCT I-STAT, CHEM 8
BUN: 13 mg/dL (ref 6–23)
Calcium, Ion: 1.05 mmol/L — ABNORMAL LOW (ref 1.12–1.32)
Chloride: 105 meq/L (ref 96–112)
Creatinine, Ser: 0.8 mg/dL (ref 0.50–1.35)
Glucose, Bld: 130 mg/dL — ABNORMAL HIGH (ref 70–99)
HCT: 39 % (ref 39.0–52.0)
Hemoglobin: 13.3 g/dL (ref 13.0–17.0)
Potassium: 4.1 meq/L (ref 3.5–5.1)
Sodium: 143 meq/L (ref 135–145)
TCO2: 24 mmol/L (ref 0–100)

## 2011-07-23 LAB — POCT I-STAT GLUCOSE
Glucose, Bld: 194 mg/dL — ABNORMAL HIGH (ref 70–99)
Operator id: 3406

## 2011-07-23 LAB — MAGNESIUM: Magnesium: 3.1 mg/dL — ABNORMAL HIGH (ref 1.5–2.5)

## 2011-07-23 LAB — CREATININE, SERUM
GFR calc Af Amer: >90 mL/min (ref >90)
GFR calc non Af Amer: >90 mL/min (ref >90)

## 2011-07-23 LAB — HEMOGLOBIN AND HEMATOCRIT, BLOOD
HCT: 25.8 % — ABNORMAL LOW (ref 39.0–52.0)
Hemoglobin: 8.9 g/dL — ABNORMAL LOW (ref 13.0–17.0)

## 2011-07-23 SURGERY — CORONARY ARTERY BYPASS GRAFTING (CABG)
Anesthesia: General | Site: Chest | Wound class: Clean

## 2011-07-23 MED ORDER — MAGNESIUM SULFATE 50 % IJ SOLN
4.0000 g | Freq: Once | INTRAVENOUS | Status: AC
  Administered 2011-07-23: 4 g via INTRAVENOUS
  Filled 2011-07-23: qty 8

## 2011-07-23 MED ORDER — SODIUM CHLORIDE 0.9 % IV SOLN: 250.0000 mL | INTRAVENOUS | Status: DC

## 2011-07-23 MED ORDER — MIDAZOLAM HCL 5 MG/5ML IJ SOLN
INTRAMUSCULAR | Status: DC | PRN
  Administered 2011-07-23 (×6): 2 mg via INTRAVENOUS

## 2011-07-23 MED ORDER — METOPROLOL TARTRATE 25 MG/10 ML ORAL SUSPENSION
12.5000 mg | Freq: Two times a day (BID) | ORAL | Status: DC
  Filled 2011-07-23 (×5): qty 5

## 2011-07-23 MED ORDER — ASPIRIN 81 MG PO CHEW
324.0000 mg | CHEWABLE_TABLET | Freq: Every day | ORAL | Status: DC
  Administered 2011-07-24 – 2011-07-26 (×2): 324 mg
  Filled 2011-07-23 (×2): qty 4

## 2011-07-23 MED ORDER — ALBUMIN HUMAN 5 % IV SOLN
INTRAVENOUS | Status: DC | PRN
  Administered 2011-07-23 (×2): via INTRAVENOUS

## 2011-07-23 MED ORDER — LACTATED RINGERS IV SOLN
INTRAVENOUS | Status: DC | PRN
  Administered 2011-07-23 (×3): via INTRAVENOUS

## 2011-07-23 MED ORDER — ALBUMIN HUMAN 5 % IV SOLN
250.0000 mL | INTRAVENOUS | Status: AC | PRN
  Administered 2011-07-23 (×2): 250 mL via INTRAVENOUS

## 2011-07-23 MED ORDER — MORPHINE SULFATE 4 MG/ML IJ SOLN
2.0000 mg | INTRAMUSCULAR | Status: DC | PRN
  Administered 2011-07-24: 2 mg via INTRAVENOUS
  Administered 2011-07-25 – 2011-07-26 (×2): 4 mg via INTRAVENOUS
  Administered 2011-07-26: 2 mg via INTRAVENOUS
  Filled 2011-07-23 (×3): qty 1

## 2011-07-23 MED ORDER — PAPAVERINE HCL 30 MG/ML IJ SOLN
INTRAMUSCULAR | Status: DC | PRN
  Administered 2011-07-23: 60 mg via INTRAVENOUS

## 2011-07-23 MED ORDER — ONDANSETRON HCL 4 MG/2ML IJ SOLN
4.0000 mg | Freq: Four times a day (QID) | INTRAMUSCULAR | Status: DC | PRN
  Administered 2011-07-24: 4 mg via INTRAVENOUS
  Filled 2011-07-23: qty 2

## 2011-07-23 MED ORDER — ASPIRIN EC 325 MG PO TBEC
325.0000 mg | DELAYED_RELEASE_TABLET | Freq: Every day | ORAL | Status: DC
  Administered 2011-07-25: 325 mg via ORAL
  Filled 2011-07-23 (×3): qty 1

## 2011-07-23 MED ORDER — SODIUM CHLORIDE 0.9 % IV SOLN
5.0000 g | Freq: Once | INTRAVENOUS | Status: DC
  Filled 2011-07-23: qty 20

## 2011-07-23 MED ORDER — HEMOSTATIC AGENTS (NO CHARGE) OPTIME
TOPICAL | Status: DC | PRN
  Administered 2011-07-23: 1 via TOPICAL

## 2011-07-23 MED ORDER — ACETAMINOPHEN 160 MG/5ML PO SOLN: 650.0000 mg | ORAL | Status: AC

## 2011-07-23 MED ORDER — ACETAMINOPHEN 160 MG/5ML PO SOLN
975.0000 mg | Freq: Four times a day (QID) | ORAL | Status: DC
  Filled 2011-07-23: qty 40.6

## 2011-07-23 MED ORDER — ACETAMINOPHEN 650 MG RE SUPP
650.0000 mg | RECTAL | Status: AC
  Administered 2011-07-23: 650 mg via RECTAL

## 2011-07-23 MED ORDER — ACETAMINOPHEN 500 MG PO TABS
1000.0000 mg | ORAL_TABLET | Freq: Four times a day (QID) | ORAL | Status: DC
  Administered 2011-07-24 – 2011-07-26 (×10): 1000 mg via ORAL
  Filled 2011-07-23 (×15): qty 2

## 2011-07-23 MED ORDER — METOPROLOL TARTRATE 12.5 MG HALF TABLET
12.5000 mg | ORAL_TABLET | Freq: Two times a day (BID) | ORAL | Status: DC
  Administered 2011-07-24 – 2011-07-25 (×2): 12.5 mg via ORAL
  Filled 2011-07-23 (×5): qty 1

## 2011-07-23 MED ORDER — PROTAMINE SULFATE 10 MG/ML IV SOLN
INTRAVENOUS | Status: DC | PRN
  Administered 2011-07-23: 300 mg via INTRAVENOUS

## 2011-07-23 MED ORDER — VECURONIUM BROMIDE 10 MG IV SOLR
INTRAVENOUS | Status: DC | PRN
Start: 2011-07-23 — End: 2011-07-23
  Administered 2011-07-23 (×4): 10 mg via INTRAVENOUS

## 2011-07-23 MED ORDER — BISACODYL 5 MG PO TBEC
10.0000 mg | DELAYED_RELEASE_TABLET | Freq: Every day | ORAL | Status: DC
  Administered 2011-07-24 – 2011-07-26 (×3): 10 mg via ORAL
  Filled 2011-07-23 (×3): qty 2

## 2011-07-23 MED ORDER — VANCOMYCIN HCL 1000 MG IV SOLR
1000.0000 mg | Freq: Once | INTRAVENOUS | Status: AC
  Administered 2011-07-23: 1000 mg via INTRAVENOUS
  Filled 2011-07-23: qty 1000

## 2011-07-23 MED ORDER — SODIUM CHLORIDE 0.9 % IJ SOLN
OROMUCOSAL | Status: DC | PRN
  Administered 2011-07-23: 08:00:00 via TOPICAL

## 2011-07-23 MED ORDER — HEPARIN SODIUM (PORCINE) 1000 UNIT/ML IJ SOLN
INTRAMUSCULAR | Status: DC | PRN
  Administered 2011-07-23: 5000 [IU] via INTRAVENOUS
  Administered 2011-07-23: 33000 [IU] via INTRAVENOUS

## 2011-07-23 MED ORDER — NITROGLYCERIN IN D5W 200-5 MCG/ML-% IV SOLN
0.0000 ug/min | INTRAVENOUS | Status: DC
  Administered 2011-07-23: 10 ug/min via INTRAVENOUS

## 2011-07-23 MED ORDER — PROPOFOL 10 MG/ML IV EMUL
INTRAVENOUS | Status: DC | PRN
  Administered 2011-07-23: 50 mg via INTRAVENOUS

## 2011-07-23 MED ORDER — SODIUM CHLORIDE 0.9 % IR SOLN
Status: DC | PRN
  Administered 2011-07-23: 1000 mL

## 2011-07-23 MED ORDER — SODIUM CHLORIDE 0.9 % IV SOLN
INTRAVENOUS | Status: DC
  Administered 2011-07-23: 3.4 [IU]/h via INTRAVENOUS
  Filled 2011-07-23: qty 1

## 2011-07-23 MED ORDER — SODIUM CHLORIDE 0.9 % IV SOLN
10.0000 g | INTRAVENOUS | Status: DC | PRN
  Administered 2011-07-23: 12:00:00 via INTRAVENOUS
  Administered 2011-07-23: 5 g/h via INTRAVENOUS

## 2011-07-23 MED ORDER — FENTANYL CITRATE 0.05 MG/ML IJ SOLN
INTRAMUSCULAR | Status: DC | PRN
  Administered 2011-07-23: 225 ug via INTRAVENOUS
  Administered 2011-07-23: 250 ug via INTRAVENOUS
  Administered 2011-07-23: 150 ug via INTRAVENOUS
  Administered 2011-07-23: 250 ug via INTRAVENOUS
  Administered 2011-07-23: 50 ug via INTRAVENOUS
  Administered 2011-07-23: 225 ug via INTRAVENOUS
  Administered 2011-07-23: 100 ug via INTRAVENOUS
  Administered 2011-07-23: 250 ug via INTRAVENOUS

## 2011-07-23 MED ORDER — POTASSIUM CHLORIDE 10 MEQ/50ML IV SOLN
10.0000 meq | INTRAVENOUS | Status: AC
  Administered 2011-07-23 (×2): 10 meq via INTRAVENOUS

## 2011-07-23 MED ORDER — SODIUM CHLORIDE 0.9 % IJ SOLN
3.0000 mL | INTRAMUSCULAR | Status: DC | PRN
  Administered 2011-07-25 (×2): 3 mL via INTRAVENOUS

## 2011-07-23 MED ORDER — PANTOPRAZOLE SODIUM 40 MG PO TBEC
40.0000 mg | DELAYED_RELEASE_TABLET | Freq: Every day | ORAL | Status: DC
  Administered 2011-07-25 – 2011-07-26 (×2): 40 mg via ORAL
  Filled 2011-07-23 (×2): qty 1

## 2011-07-23 MED ORDER — DEXMEDETOMIDINE HCL 100 MCG/ML IV SOLN
0.1000 ug/kg/h | INTRAVENOUS | Status: DC
  Administered 2011-07-23: 0.7 ug/kg/h via INTRAVENOUS
  Filled 2011-07-23: qty 2

## 2011-07-23 MED ORDER — DEXTROSE 5 % IV SOLN
1.5000 g | Freq: Two times a day (BID) | INTRAVENOUS | Status: AC
  Administered 2011-07-23 – 2011-07-24 (×3): 1.5 g via INTRAVENOUS
  Filled 2011-07-23 (×4): qty 1.5

## 2011-07-23 MED ORDER — ROCURONIUM BROMIDE 100 MG/10ML IV SOLN
INTRAVENOUS | Status: DC | PRN
  Administered 2011-07-23: 50 mg via INTRAVENOUS

## 2011-07-23 MED ORDER — PHENYLEPHRINE HCL 10 MG/ML IJ SOLN
0.0000 ug/min | INTRAVENOUS | Status: DC
  Administered 2011-07-23: 9 ug/min via INTRAVENOUS
  Filled 2011-07-23: qty 2

## 2011-07-23 MED ORDER — CALCIUM CHLORIDE 10 % IV SOLN
1.0000 g | Freq: Once | INTRAVENOUS | Status: AC | PRN
  Filled 2011-07-23: qty 10

## 2011-07-23 MED ORDER — FAMOTIDINE IN NACL 20-0.9 MG/50ML-% IV SOLN
20.0000 mg | Freq: Two times a day (BID) | INTRAVENOUS | Status: AC
  Administered 2011-07-24: 20 mg via INTRAVENOUS
  Filled 2011-07-23: qty 50

## 2011-07-23 MED ORDER — LACTATED RINGERS IV SOLN
INTRAVENOUS | Status: DC
  Administered 2011-07-23: 16:00:00 via INTRAVENOUS

## 2011-07-23 MED ORDER — SODIUM CHLORIDE 0.45 % IV SOLN
INTRAVENOUS | Status: DC
  Administered 2011-07-23 – 2011-07-24 (×2): via INTRAVENOUS

## 2011-07-23 MED ORDER — SODIUM CHLORIDE 0.9 % IJ SOLN
3.0000 mL | Freq: Two times a day (BID) | INTRAMUSCULAR | Status: DC
  Administered 2011-07-24 – 2011-07-26 (×5): 3 mL via INTRAVENOUS

## 2011-07-23 MED ORDER — BISACODYL 10 MG RE SUPP: 10.0000 mg | Freq: Every day | RECTAL | Status: DC

## 2011-07-23 MED ORDER — MIDAZOLAM HCL 2 MG/2ML IJ SOLN: 2.0000 mg | INTRAMUSCULAR | Status: DC | PRN

## 2011-07-23 MED ORDER — MORPHINE SULFATE 2 MG/ML IJ SOLN
1.0000 mg | INTRAMUSCULAR | Status: AC | PRN
Start: 2011-07-23 — End: 2011-07-24

## 2011-07-23 MED ORDER — DOCUSATE SODIUM 100 MG PO CAPS
200.0000 mg | ORAL_CAPSULE | Freq: Every day | ORAL | Status: DC
  Administered 2011-07-24 – 2011-07-26 (×3): 200 mg via ORAL
  Filled 2011-07-23 (×3): qty 2

## 2011-07-23 MED ORDER — OXYCODONE HCL 5 MG PO TABS
5.0000 mg | ORAL_TABLET | ORAL | Status: DC | PRN
Start: 2011-07-23 — End: 2011-07-26
  Administered 2011-07-24 (×3): 5 mg via ORAL
  Administered 2011-07-24 (×2): 10 mg via ORAL
  Administered 2011-07-24: 5 mg via ORAL
  Administered 2011-07-24: 10 mg via ORAL
  Administered 2011-07-25 (×2): 5 mg via ORAL
  Administered 2011-07-25 – 2011-07-26 (×6): 10 mg via ORAL
  Filled 2011-07-23: qty 1
  Filled 2011-07-23 (×2): qty 2
  Filled 2011-07-23 (×3): qty 1
  Filled 2011-07-23 (×5): qty 2
  Filled 2011-07-23 (×2): qty 1
  Filled 2011-07-23 (×2): qty 2

## 2011-07-23 MED ORDER — SODIUM CHLORIDE 0.9 % IV SOLN: INTRAVENOUS | Status: DC

## 2011-07-23 MED ORDER — METOPROLOL TARTRATE 1 MG/ML IV SOLN: 2.5000 mg | INTRAVENOUS | Status: DC | PRN

## 2011-07-23 SURGICAL SUPPLY — 153 items
ADAPTER CARDIO PERF ANTE/RETRO (ADAPTER) ×2 IMPLANT
APPLICATOR TIP BIOGLUE STANDRD (MISCELLANEOUS) IMPLANT
APPLICATOR TIP COSEAL (VASCULAR PRODUCTS) ×2 IMPLANT
APPLIER CLIP 9.375 MED OPEN (MISCELLANEOUS)
APPLIER CLIP 9.375 SM OPEN (CLIP)
ATTRACTOMAT 16X20 MAGNETIC DRP (DRAPES) ×2 IMPLANT
BAG DECANTER FOR FLEXI CONT (MISCELLANEOUS) ×4 IMPLANT
BANDAGE ELASTIC 4 VELCRO ST LF (GAUZE/BANDAGES/DRESSINGS) ×2 IMPLANT
BANDAGE ELASTIC 6 VELCRO ST LF (GAUZE/BANDAGES/DRESSINGS) ×2 IMPLANT
BANDAGE GAUZE ELAST BULKY 4 IN (GAUZE/BANDAGES/DRESSINGS) ×2 IMPLANT
BASKET HEART (ORDER IN 25'S) (MISCELLANEOUS) ×1
BASKET HEART (ORDER IN 25S) (MISCELLANEOUS) ×1 IMPLANT
BENZOIN TINCTURE PRP APPL 2/3 (GAUZE/BANDAGES/DRESSINGS) ×2 IMPLANT
BLADE STERNUM SYSTEM 6 (BLADE) ×2 IMPLANT
BLADE SURG 11 STRL SS (BLADE) ×4 IMPLANT
BLADE SURG ROTATE 9660 (MISCELLANEOUS) IMPLANT
CANISTER SUCTION 2500CC (MISCELLANEOUS) ×2 IMPLANT
CANNULA GUNDRY RCSP 15FR (MISCELLANEOUS) ×2 IMPLANT
CATH CPB KIT OWEN (MISCELLANEOUS) ×2 IMPLANT
CATH HEART VENT LEFT (CATHETERS) ×1 IMPLANT
CATH ROBINSON RED A/P 18FR (CATHETERS) ×8 IMPLANT
CATH THORACIC 28FR (CATHETERS) IMPLANT
CATH THORACIC 28FR RT ANG (CATHETERS) IMPLANT
CATH THORACIC 36FR (CATHETERS) ×2 IMPLANT
CATH THORACIC 36FR RT ANG (CATHETERS) ×2 IMPLANT
CLIP APPLIE 9.375 MED OPEN (MISCELLANEOUS) IMPLANT
CLIP APPLIE 9.375 SM OPEN (CLIP) IMPLANT
CLIP FOGARTY SPRING 6M (CLIP) IMPLANT
CLIP RETRACTION 3.0MM CORONARY (MISCELLANEOUS) ×2 IMPLANT
CLIP TI MEDIUM 24 (CLIP) IMPLANT
CLIP TI MEDIUM 6 (CLIP) IMPLANT
CLIP TI WIDE RED SMALL 24 (CLIP) IMPLANT
CLIP TI WIDE RED SMALL 6 (CLIP) IMPLANT
CLOTH BEACON ORANGE TIMEOUT ST (SAFETY) ×2 IMPLANT
CONN Y 3/8X3/8X3/8  BEN (MISCELLANEOUS)
CONN Y 3/8X3/8X3/8 BEN (MISCELLANEOUS) IMPLANT
CONT SPECI 4OZ STER CLIK (MISCELLANEOUS) ×2 IMPLANT
COVER SURGICAL LIGHT HANDLE (MISCELLANEOUS) ×4 IMPLANT
CRADLE DONUT ADULT HEAD (MISCELLANEOUS) ×2 IMPLANT
DERMABOND ADVANCED (GAUZE/BANDAGES/DRESSINGS) ×2
DERMABOND ADVANCED .7 DNX12 (GAUZE/BANDAGES/DRESSINGS) ×2 IMPLANT
DRAIN CHANNEL 32F RND 10.7 FF (WOUND CARE) ×2 IMPLANT
DRAPE CARDIOVASCULAR INCISE (DRAPES) ×1
DRAPE INCISE IOBAN 66X45 STRL (DRAPES) ×2 IMPLANT
DRAPE SLUSH/WARMER DISC (DRAPES) ×2 IMPLANT
DRAPE SRG 135X102X78XABS (DRAPES) ×1 IMPLANT
DRSG COVADERM 4X14 (GAUZE/BANDAGES/DRESSINGS) ×2 IMPLANT
ELECT REM PT RETURN 9FT ADLT (ELECTROSURGICAL) ×4
ELECTRODE REM PT RTRN 9FT ADLT (ELECTROSURGICAL) ×2 IMPLANT
GLOVE BIO SURGEON STRL SZ 6 (GLOVE) ×6 IMPLANT
GLOVE BIO SURGEON STRL SZ 6.5 (GLOVE) ×4 IMPLANT
GLOVE BIO SURGEON STRL SZ7 (GLOVE) ×4 IMPLANT
GLOVE BIO SURGEON STRL SZ7.5 (GLOVE) IMPLANT
GLOVE BIO SURGEON STRL SZ8 (GLOVE) ×4 IMPLANT
GLOVE BIOGEL PI IND STRL 6 (GLOVE) IMPLANT
GLOVE BIOGEL PI IND STRL 6.5 (GLOVE) IMPLANT
GLOVE BIOGEL PI IND STRL 7.0 (GLOVE) ×4 IMPLANT
GLOVE BIOGEL PI INDICATOR 6 (GLOVE)
GLOVE BIOGEL PI INDICATOR 6.5 (GLOVE)
GLOVE BIOGEL PI INDICATOR 7.0 (GLOVE) ×4
GLOVE EUDERMIC 7 POWDERFREE (GLOVE) IMPLANT
GLOVE EXAM NITRILE XS STR PU (GLOVE) ×2 IMPLANT
GLOVE ORTHO TXT STRL SZ7.5 (GLOVE) ×6 IMPLANT
GLOVE SURG EUDERMIC 8 LTX PF (GLOVE) ×2 IMPLANT
GOWN STRL NON-REIN LRG LVL3 (GOWN DISPOSABLE) ×8 IMPLANT
HEMOSTAT POWDER SURGIFOAM 1G (HEMOSTASIS) ×6 IMPLANT
INSERT FOGARTY 61MM (MISCELLANEOUS) IMPLANT
INSERT FOGARTY XLG (MISCELLANEOUS) ×4 IMPLANT
KIT BASIN OR (CUSTOM PROCEDURE TRAY) ×2 IMPLANT
KIT PAIN CUSTOM (MISCELLANEOUS) IMPLANT
KIT ROOM TURNOVER OR (KITS) ×2 IMPLANT
KIT SUCTION CATH 14FR (SUCTIONS) ×2 IMPLANT
KIT VASOVIEW ACCESSORY VH 2004 (KITS) ×2 IMPLANT
KIT VASOVIEW W/TROCAR VH 2000 (KITS) ×2 IMPLANT
LEAD PACING MYOCARDI (MISCELLANEOUS) ×2 IMPLANT
MARKER GRAFT CORONARY BYPASS (MISCELLANEOUS) ×6 IMPLANT
NS IRRIG 1000ML POUR BTL (IV SOLUTION) ×14 IMPLANT
PACK OPEN HEART (CUSTOM PROCEDURE TRAY) ×2 IMPLANT
PAD ARMBOARD 7.5X6 YLW CONV (MISCELLANEOUS) ×4 IMPLANT
PENCIL BUTTON HOLSTER BLD 10FT (ELECTRODE) ×2 IMPLANT
PUNCH AORTIC ROTATE 4.5MM 8IN (MISCELLANEOUS) IMPLANT
PUNCH AORTIC ROTATE 5MM 8IN (MISCELLANEOUS) IMPLANT
SEALANT SURG COSEAL 8ML (VASCULAR PRODUCTS) ×2 IMPLANT
SET IRRIG TUBING LAPAROSCOPIC (IRRIGATION / IRRIGATOR) ×2 IMPLANT
SOLUTION ANTI FOG 6CC (MISCELLANEOUS) IMPLANT
SPONGE GAUZE 4X4 12PLY (GAUZE/BANDAGES/DRESSINGS) ×4 IMPLANT
SPONGE GAUZE 4X4 FOR O.R. (GAUZE/BANDAGES/DRESSINGS) ×6 IMPLANT
SPONGE INTESTINAL PEANUT (DISPOSABLE) IMPLANT
SPONGE LAP 18X18 X RAY DECT (DISPOSABLE) ×8 IMPLANT
SPONGE LAP 4X18 X RAY DECT (DISPOSABLE) ×4 IMPLANT
STOPCOCK 4 WAY LG BORE MALE ST (IV SETS) IMPLANT
STRIP CLOSURE SKIN 1/2X4 (GAUZE/BANDAGES/DRESSINGS) ×2 IMPLANT
SURGIFLO TRUKIT (HEMOSTASIS) ×2 IMPLANT
SUT BONE WAX W31G (SUTURE) ×2 IMPLANT
SUT ETHIBON 2 0 V 52N 30 (SUTURE) ×4 IMPLANT
SUT ETHIBON EXCEL 2-0 V-5 (SUTURE) IMPLANT
SUT ETHIBOND 2 0 SH (SUTURE)
SUT ETHIBOND 2 0 SH 36X2 (SUTURE) IMPLANT
SUT ETHIBOND 2 0 V4 (SUTURE) IMPLANT
SUT ETHIBOND 2 0V4 GREEN (SUTURE) IMPLANT
SUT ETHIBOND 4 0 RB 1 (SUTURE) IMPLANT
SUT ETHIBOND NAB MH 2-0 36IN (SUTURE) ×2 IMPLANT
SUT ETHIBOND V-5 VALVE (SUTURE) IMPLANT
SUT ETHIBOND X763 2 0 SH 1 (SUTURE) ×12 IMPLANT
SUT MNCRL AB 3-0 PS2 18 (SUTURE) ×4 IMPLANT
SUT MNCRL AB 4-0 PS2 18 (SUTURE) ×2 IMPLANT
SUT PDS AB 1 CTX 36 (SUTURE) ×4 IMPLANT
SUT PROLENE 2 0 SH DA (SUTURE) IMPLANT
SUT PROLENE 3 0 SH DA (SUTURE) ×8 IMPLANT
SUT PROLENE 3 0 SH1 36 (SUTURE) IMPLANT
SUT PROLENE 4 0 RB 1 (SUTURE) ×4
SUT PROLENE 4 0 SH DA (SUTURE) IMPLANT
SUT PROLENE 4-0 RB1 .5 CRCL 36 (SUTURE) ×4 IMPLANT
SUT PROLENE 5 0 C 1 36 (SUTURE) IMPLANT
SUT PROLENE 6 0 C 1 30 (SUTURE) ×6 IMPLANT
SUT PROLENE 6 0 CC (SUTURE) IMPLANT
SUT PROLENE 7 0 BV 1 (SUTURE) IMPLANT
SUT PROLENE 7 0 BV1 MDA (SUTURE) IMPLANT
SUT PROLENE 7.0 RB 3 (SUTURE) ×8 IMPLANT
SUT PROLENE 8 0 BV175 6 (SUTURE) ×6 IMPLANT
SUT PROLENE BLUE 7 0 (SUTURE) ×6 IMPLANT
SUT PROLENE POLY MONO (SUTURE) ×2 IMPLANT
SUT SILK  1 MH (SUTURE) ×2
SUT SILK 1 MH (SUTURE) ×2 IMPLANT
SUT SILK 2 0 SH CR/8 (SUTURE) IMPLANT
SUT SILK 3 0 SH CR/8 (SUTURE) IMPLANT
SUT STEEL 6MS V (SUTURE) ×4 IMPLANT
SUT STEEL STERNAL CCS#1 18IN (SUTURE) IMPLANT
SUT STEEL SZ 6 DBL 3X14 BALL (SUTURE) ×6 IMPLANT
SUT VIC AB 1 CTX 36 (SUTURE)
SUT VIC AB 1 CTX36XBRD ANBCTR (SUTURE) IMPLANT
SUT VIC AB 2-0 CT1 27 (SUTURE)
SUT VIC AB 2-0 CT1 TAPERPNT 27 (SUTURE) IMPLANT
SUT VIC AB 2-0 CTX 27 (SUTURE) ×2 IMPLANT
SUT VIC AB 3-0 SH 27 (SUTURE) ×2
SUT VIC AB 3-0 SH 27X BRD (SUTURE) ×1 IMPLANT
SUT VIC AB 3-0 SH 27XBRD (SUTURE) ×1 IMPLANT
SUT VIC AB 3-0 X1 27 (SUTURE) IMPLANT
SUT VICRYL 4-0 PS2 18IN ABS (SUTURE) IMPLANT
SUTURE E-PAK OPEN HEART (SUTURE) ×2 IMPLANT
SYR 10ML KIT SKIN ADHESIVE (MISCELLANEOUS) IMPLANT
SYSTEM SAHARA CHEST DRAIN ATS (WOUND CARE) ×2 IMPLANT
TOWEL OR 17X24 6PK STRL BLUE (TOWEL DISPOSABLE) ×2 IMPLANT
TOWEL OR 17X26 10 PK STRL BLUE (TOWEL DISPOSABLE) ×6 IMPLANT
TRAY FOLEY IC TEMP SENS 14FR (CATHETERS) ×2 IMPLANT
TUBE CONNECTING 12X1/4 (SUCTIONS) ×2 IMPLANT
TUBE SUCT INTRACARD DLP 20F (MISCELLANEOUS) ×2 IMPLANT
TUBING INSUFFLATION 10FT LAP (TUBING) ×2 IMPLANT
UNDERPAD 30X30 INCONTINENT (UNDERPADS AND DIAPERS) ×2 IMPLANT
VALVE AORTIC MAGNA 25MM (Prosthesis & Implant Heart) ×2 IMPLANT
VENT LEFT HEART 12002 (CATHETERS) ×2
WATER STERILE IRR 1000ML POUR (IV SOLUTION) ×4 IMPLANT
YANKAUER SUCT BULB TIP NO VENT (SUCTIONS) ×4 IMPLANT

## 2011-07-23 NOTE — Preoperative (Signed)
Beta Blockers   Reason not to administer Beta Blockers:Not Applicable 

## 2011-07-23 NOTE — Brief Op Note (Signed)
07/16/2011 - 07/23/2011  2:41 PM  PATIENT:  Sean [name]  65 y.o. male  PRE-OPERATIVE DIAGNOSIS:  CAD, aortic stenosis  POST-OPERATIVE DIAGNOSIS:  CAD, aortic stenosis  PROCEDURE:  Procedure(s): CORONARY ARTERY BYPASS GRAFTING (CABG) AORTIC VALVE REPLACEMENT (AVR)  SURGEON:  Purcell Nails, MD  ASSISTANTS: Al Corpus, CSFA  ANESTHESIA:  Josepha Pigg, MD  CROSSCLAMP TIME:    126' TOTAL CARDIOPULMONARY BYPASS TIME: 168'  FINDINGS:  Normal LV systolic function  Bicuspid aortic valve with mild-moderate aortic stenosis and moderate aortic insufficiency  Good quality LIMA and fair to good quality saphenous vein conduit  Intramyocardial LAD and OM1  PATIENT DISPOSITION:   TO SICU IN STABLE CONDITION  [name],[name] 07/23/2011 2:44 PM

## 2011-07-23 NOTE — Op Note (Signed)
CARDIOTHORACIC SURGERY OPERATIVE NOTE  Date of Procedure: 07/23/2011  Preoperative Diagnosis:   Severe Aortic Stenosis  Severe 3-vessel Coronary Artery Disease  Postoperative Diagnosis: Same  Procedure:    Aortic Valve Replacement   25mm Edwards Magna Ease Pericardial Tissue Valve   Coronary Artery Bypass Grafting x 3   Left Internal Mammary Artery to Distal Left Anterior Descending Coronary Artery  Saphenous Vein Graft to  Posterior Descending Coronary Artery  Saphenous Vein Graft to First Obtuse Marginal Branch of Left Circumflex Coronary Artery  Endoscopic Vein Harvest from Bilateral Thighs  Surgeon: Salvatore Decent. Cornelius Moras, MD Assistant: Al Corpus, CSFA Anesthesia: J. Terrill Massagee  Operative Findings:  Bicuspid native aortic valve with mild-moderate aortic stenosis, moderate aortic insufficiency  Normal left ventricular systolic function  Good quality left internal mammary artery conduit  Fair to good quality saphenous vein conduit  Fair to good quality target vessels for grafting  Intramyocardial LAD and OM1   BRIEF CLINICAL NOTE AND INDICATIONS FOR SURGERY  Patient is a 65 year old obese white male who is a service connected disabled [place] veteran who typically gets his care through the [place] medical both at the [facility] and [facility]. The patient has known history of coronary artery disease with previous myocardial infarction more than 15 years ago. He he apparently had a second heart attack several years later and underwent PCI and stenting at that time. Records of his previous coronary interventions are not available. The patient also has history of an implantable defibrillator. This was placed after he sustained a cardiac arrest following a prep for upper GI endoscopy. His defibrillator is followed regularly at the [facility].  The patient reports that just over one month ago he had an episode where he developed discomfort in  his chest radiating to his neck and down his arm associated with shortness of breath while he was out hunting. His defibrillator fired. Since then he has not felt well and he admits to worsened exertional shortness of breath and decreased energy intermittent episodes of exertional chest discomfort. 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 He again developed typical discomfort on the left side of his neck and chest associated with shortness of breath. He became dizzy and his defibrillator fired. He blacked out but will rapidly came back consciousness. His wife was present and summoned EMS. The patient was brought to [facility] for further management. Upon arrival in the emergency department interrogation of the patient's defibrillator revealed that the patient had an appropriate shock for ventricular fibrillation. He was admitted to the hospital and subsequently ruled out for acute myocardial infarction. The patient underwent elective cardiac catheterization by Dr. Excell Seltzer. He was found to have severe three-vessel coronary artery disease with mild left ventricular dysfunction.  Transthoracic and transesophageal echocardiogram demonstrate mild to moderate aortic stenosis with what appears to be a bicuspid aortic valve and mild to moderate aortic insufficiency.  The patient and his family have been counseled at length regarding the indications, risks and potential benefits of surgery.  Alternative treatment strategies have been reviewed.  The patient provides full informed consent for the procedure as described.    DETAILS OF THE OPERATIVE PROCEDURE  The patient is brought to the operating room on the above mentioned date and central monitoring was established by the anesthesia team including placement of Swan-Ganz catheter and radial arterial line. The patient's existing defibrillator was turned off by the [employer] representative.  The patient is placed  in the supine position on the operating  table.  Intravenous antibiotics are administered. General endotracheal anesthesia is induced uneventfully. A Foley catheter is placed.  Baseline transesophageal echocardiogram was performed.  Findings were notable for bicuspid aortic valve with mild to moderate aortic stenosis and mild to moderate aortic insufficiency.  There was normal left ventricular function.  The patient's chest, abdomen, both groins, and both lower extremities are prepared and draped in a sterile manner. A time out procedure is performed.  A median sternotomy incision was performed and the left internal mammary artery is dissected from the chest wall and prepared for bypass grafting. The left internal mammary artery is notably good quality conduit. Simultaneously, saphenous vein is obtained from the patient's right using endoscopic vein harvest technique. The saphenous vein is notably poor quality conduit. Subsequently another segment of vein was removed from the patient's left thigh using endoscopic vein harvest technique.  This vein was satisfactory conduit and used for the surgery.  After removal of the saphenous vein, the small surgical incisions in the lower extremities are closed with absorbable suture. Following systemic heparinization, the left internal mammary artery was transected distally noted to have excellent flow.  The pericardium is opened. The ascending aorta is severely calcified in the transverse arch but relatively normal proximally. The ascending aorta and the right atrium are cannulated for cardioplegia bypass.  Adequate heparinization is verified.    A retrograde cardioplegia cannula is placed through the right atrium into the coronary sinus.   The entire pre-bypass portion of the operation was notable for stable hemodynamics.  Cardiopulmonary bypass was begun and a left ventricular vent placed through the right superior pulmonary vein.  The surface of the heart inspected. Distal target vessels are selected  for coronary artery bypass grafting. A cardioplegia cannula is placed in the ascending aorta.  A temperature probe was placed in the interventricular septum.  The patient is cooled to 30C systemic temperature.  The aortic cross clamp is applied and cold blood cardioplegia is delivered initially in an antegrade fashion through the aortic root.   Supplemental cardioplegia is given retrograde through the coronary sinus catheter.  Iced saline slush is applied for topical hypothermia.  The initial cardioplegic arrest is rapid with early diastolic arrest.  Repeat doses of cardioplegia are administered intermittently throughout the entire cross clamp portion of the operation through the aortic root,  through the coronary sinus catheter, and through subsequently placed vein grafts in order to maintain completely flat electrocardiogram and septal myocardial temperature below 15C.  Myocardial protection was felt to be  excellent.  The following distal coronary artery bypass grafts were performed:   The  posterior descending branch of the right coronary artery was grafted using a reversed saphenous vein graft in an end-to-side fashion.  At the site of distal anastomosis the target vessel was fair quality and measured approximately 1.5 mm in diameter.  This vessel was diffusely diseased.  The first obtuse marginal branch of the left circumflex coronary artery was grafted using a reversed saphenous vein graft in an end-to-side fashion.  At the site of distal anastomosis the target vessel was intramyocardial but good quality and measured approximately 2.0 mm in diameter.  The distal left anterior coronary artery was grafted with the left internal mammary artery in an end-to-side fashion.  At the site of distal anastomosis the target vessel was intramyocardial but good quality and measured approximately 2.0 mm in diameter.   An oblique transverse aortotomy incision was performed.  The  aortic valve was inspected and  notably bicuspid.  There was moderate aortic stenosis with fused raphe of the left and right leaflet and severe calcification between the right and the noncoronary leaflet.  The aortic valve leaflets were excised sharply and the aortic annulus decalcified.  Decalcification was notably straightforward.  The aortic annulus was sized to accept a 25 mm prosthesis.  The aortic root and left ventricle were irrigated with copious cold saline solution.  Aortic valve replacement was performed using interrupted horizontal mattress 2-0 Ethibond pledgeted sutures with pledgets in the subannular position.  An [facility] Ease pericardial tissue valve (size 25 mm, model # 3300TFX, serial # [id number]) was implanted uneventfully. The valve seated appropriately with adequate space beneath the left main and right coronary artery.  The aortotomy was closed  using a 2-layer closure of running 4-0 Prolene suture.  All proximal vein graft anastomoses were placed directly to the ascending aorta prior to removal of the aortic cross clamp.  The septal myocardial temperature rose rapidly after reperfusion of the left internal mammary artery graft.  One final dose of warm retrograde "hot shot" cardioplegia was administered through the coronary sinus catheter while all air was evacuated through the aortic root.  The aortic cross clamp was removed after a total cross clamp time of 126 minutes.  All proximal and distal coronary anastomoses were inspected for hemostasis and appropriate graft orientation. Epicardial pacing wires are fixed to the right ventricular outflow tract and to the right atrial appendage. The patient is rewarmed to 37C temperature. The aortic and left ventricular vents were removed.  The patient is weaned and disconnected from cardiopulmonary bypass.  The patient's rhythm at separation from bypass was AV paced.  The patient was weaned from cardioplegic bypass  without any inotropic support. Total cardiopulmonary  bypass time for the operation was 168 minutes.  Followup transesophageal echocardiogram performed after separation from bypass revealed a well seated bioprosthetic tissue valve in the aortic position with otherwise no changes from the preoperative exam.  The aortic and venous cannula were removed uneventfully. Protamine was administered to reverse the anticoagulation. The mediastinum and pleural space were inspected for hemostasis and irrigated with saline solution. The mediastinum and left pleural space were drained using 3 chest tubes placed through separate stab incisions inferiorly.  The soft tissues anterior to the aorta were reapproximated loosely. The sternum is closed with double strength sternal wire. The soft tissues anterior to the sternum were closed in multiple layers and the skin is closed with a running subcuticular skin closure.   The post-bypass portion of the operation was notable for stable rhythm and hemodynamics.  The patient received two packs platelets and two units FFP for coagulopathy.  The patient tolerated the procedure well and is transported to the surgical intensive care in stable condition. There are no intraoperative complications. All sponge instrument and needle counts are verified correct at completion of the operation.       Salvatore Decent. Cornelius Moras MD

## 2011-07-23 NOTE — OR Nursing (Signed)
2nd call to SICU @ 14:38

## 2011-07-23 NOTE — Transfer of Care (Signed)
Immediate Anesthesia Transfer of Care Note  Patient: Sean [name]  Procedure(s) Performed:  CORONARY ARTERY BYPASS GRAFTING (CABG) - CABG x 3 using bilateral greater saphenous veins harvested endoscopically; AORTIC VALVE REPLACEMENT (AVR)  Patient Location: SICU  Anesthesia Type: General  Level of Consciousness: sedated  Airway & Oxygen Therapy: Patient remains intubated per anesthesia plan  Post-op Assessment: Report given to PACU RN and Post -op Vital signs reviewed and stable  Post vital signs: Reviewed and stable  Complications: No apparent anesthesia complications

## 2011-07-23 NOTE — Anesthesia Preprocedure Evaluation (Addendum)
Anesthesia Evaluation  Patient identified by MRN, date of birth, ID band Patient awake    Reviewed: Allergy & Precautions, H&P , NPO status , Patient's Chart, lab work & pertinent test results, reviewed documented beta blocker date and time   Airway Mallampati: II  Neck ROM: Full    Dental  (+) Partial Lower   Pulmonary shortness of breath and with exertion, COPD COPD inhaler, Current Smoker,  clear to auscultation        Cardiovascular + angina with exertion + CAD, + Past MI and +CHF + Cardiac Defibrillator Regular Normal+ Systolic murmurs    Neuro/Psych    GI/Hepatic   Endo/Other  Diabetes mellitus-, Type 2  Renal/GU      Musculoskeletal   Abdominal (+) obese,   Peds  Hematology   Anesthesia Other Findings   Reproductive/Obstetrics                          Anesthesia Physical Anesthesia Plan  ASA: III  Anesthesia Plan: General   Post-op Pain Management:    Induction: Intravenous  Airway Management Planned: Oral ETT  Additional Equipment: Arterial line, TEE and PA Cath  Intra-op Plan:   Post-operative Plan: Post-operative intubation/ventilation  Informed Consent: I have reviewed the patients History and Physical, chart, labs and discussed the procedure including the risks, benefits and alternatives for the proposed anesthesia with the patient or authorized representative who has indicated his/her understanding and acceptance.     Plan Discussed with: Surgeon  Anesthesia Plan Comments:         Anesthesia Quick Evaluation

## 2011-07-23 NOTE — OR Nursing (Signed)
13:50 call made to volunteer desk to inform family off pump, 1st call made to SICU

## 2011-07-23 NOTE — Procedures (Signed)
Extubation Procedure Note  Patient Details:   Name: Sean [name] DOB: Dec 02, 1945 MRN: [id number]   Airway Documentation:   VC 1050, NIF -40, able to hold head off bed 15 seconds and follow commands appropriately.  Able to breathe around deflated cuff.  Extubated to 4 lpm nasal cannula, tolerated well, able to vocalize post procedure.   Evaluation  O2 sats: stable throughout Complications: No apparent complications Patient did tolerate procedure well. Bilateral Breath Sounds: Clear   Yes  Chelcea Zahn, Aloha Gell 07/23/2011, 8:33 PM

## 2011-07-23 NOTE — Progress Notes (Signed)
ICD turned on by Truddie Hidden with [employer] at 1600.

## 2011-07-24 ENCOUNTER — Encounter (HOSPITAL_COMMUNITY): Payer: Self-pay | Admitting: Cardiology

## 2011-07-24 ENCOUNTER — Inpatient Hospital Stay (HOSPITAL_COMMUNITY): Payer: Medicare Other

## 2011-07-24 LAB — PREPARE FRESH FROZEN PLASMA: Unit division: 0

## 2011-07-24 LAB — BASIC METABOLIC PANEL
BUN: 14 mg/dL (ref 6–23)
CO2: 26 mEq/L (ref 19–32)
Chloride: 106 mEq/L (ref 96–112)
Creatinine, Ser: 0.81 mg/dL (ref 0.50–1.35)
Glucose, Bld: 123 mg/dL — ABNORMAL HIGH (ref 70–99)

## 2011-07-24 LAB — CBC
HCT: 27.8 % — ABNORMAL LOW (ref 39.0–52.0)
HCT: 28.3 % — ABNORMAL LOW (ref 39.0–52.0)
Hemoglobin: 9.5 g/dL — ABNORMAL LOW (ref 13.0–17.0)
Hemoglobin: 9.4 g/dL — ABNORMAL LOW (ref 13.0–17.0)
MCH: 30.3 pg (ref 26.0–34.0)
MCH: 29.7 pg (ref 26.0–34.0)
MCHC: 33.2 g/dL (ref 30.0–36.0)
MCHC: 33 g/dL (ref 30.0–36.0)
MCV: 88.5 fL (ref 78.0–100.0)
MCV: 89.6 fL (ref 78.0–100.0)
Platelets: 137 10*3/uL — ABNORMAL LOW (ref 150–400)
RBC: 3.14 MIL/uL — ABNORMAL LOW (ref 4.22–5.81)
RBC: 3.16 MIL/uL — ABNORMAL LOW (ref 4.22–5.81)
RDW: 14.9 % (ref 11.5–15.5)
WBC: 10.5 10*3/uL (ref 4.0–10.5)

## 2011-07-24 LAB — PREPARE PLATELET PHERESIS: Unit division: 0

## 2011-07-24 LAB — GLUCOSE, CAPILLARY
Glucose-Capillary: 115 mg/dL — ABNORMAL HIGH (ref 70–99)
Glucose-Capillary: 129 mg/dL — ABNORMAL HIGH (ref 70–99)
Glucose-Capillary: 130 mg/dL — ABNORMAL HIGH (ref 70–99)
Glucose-Capillary: 132 mg/dL — ABNORMAL HIGH (ref 70–99)
Glucose-Capillary: 105 mg/dL — ABNORMAL HIGH (ref 70–99)
Glucose-Capillary: 103 mg/dL — ABNORMAL HIGH (ref 70–99)
Glucose-Capillary: 105 mg/dL — ABNORMAL HIGH (ref 70–99)
Glucose-Capillary: 110 mg/dL — ABNORMAL HIGH (ref 70–99)
Glucose-Capillary: 121 mg/dL — ABNORMAL HIGH (ref 70–99)
Glucose-Capillary: 132 mg/dL — ABNORMAL HIGH (ref 70–99)
Glucose-Capillary: 208 mg/dL — ABNORMAL HIGH (ref 70–99)

## 2011-07-24 LAB — MAGNESIUM
Magnesium: 2.8 mg/dL — ABNORMAL HIGH (ref 1.5–2.5)
Magnesium: 2.4 mg/dL (ref 1.5–2.5)

## 2011-07-24 LAB — CREATININE, SERUM
Creatinine, Ser: 0.83 mg/dL (ref 0.50–1.35)
Creatinine, Ser: 0.93 mg/dL (ref 0.50–1.35)
GFR calc non Af Amer: 85 mL/min — ABNORMAL LOW (ref >90)

## 2011-07-24 LAB — POCT I-STAT, CHEM 8
BUN: 18 mg/dL (ref 6–23)
Calcium, Ion: 1.04 mmol/L — ABNORMAL LOW (ref 1.12–1.32)
Chloride: 100 mEq/L (ref 96–112)

## 2011-07-24 MED ORDER — POTASSIUM CHLORIDE 10 MEQ/50ML IV SOLN
10.0000 meq | INTRAVENOUS | Status: AC
  Administered 2011-07-24 (×3): 10 meq via INTRAVENOUS

## 2011-07-24 MED ORDER — INSULIN GLARGINE 100 UNIT/ML ~~LOC~~ SOLN
40.0000 [IU] | SUBCUTANEOUS | Status: DC
  Administered 2011-07-24 – 2011-07-25 (×2): 40 [IU] via SUBCUTANEOUS
  Filled 2011-07-24: qty 3

## 2011-07-24 MED ORDER — FUROSEMIDE 10 MG/ML IJ SOLN
40.0000 mg | Freq: Once | INTRAMUSCULAR | Status: AC
  Administered 2011-07-24: 40 mg via INTRAVENOUS

## 2011-07-24 MED ORDER — INSULIN ASPART 100 UNIT/ML ~~LOC~~ SOLN
0.0000 [IU] | SUBCUTANEOUS | Status: DC
  Administered 2011-07-24 (×2): 8 [IU] via SUBCUTANEOUS
  Filled 2011-07-24: qty 3

## 2011-07-24 MED ORDER — INSULIN ASPART 100 UNIT/ML ~~LOC~~ SOLN
3.0000 [IU] | Freq: Three times a day (TID) | SUBCUTANEOUS | Status: DC
  Administered 2011-07-24 (×2): 3 [IU] via SUBCUTANEOUS

## 2011-07-24 MED ORDER — ENOXAPARIN SODIUM 40 MG/0.4ML ~~LOC~~ SOLN
40.0000 mg | Freq: Every day | SUBCUTANEOUS | Status: DC
  Administered 2011-07-24 – 2011-07-30 (×7): 40 mg via SUBCUTANEOUS
  Filled 2011-07-24 (×8): qty 0.4

## 2011-07-24 NOTE — Progress Notes (Signed)
Patient ID: Sean [name], male   DOB: 06-11-1946, 65 y.o.   MRN: [id number]  Filed Vitals:   07/24/11 1600 07/24/11 1700 07/24/11 1800 07/24/11 1900  BP: 108/60 103/57 112/55 103/51  Pulse: 88 90 85 91  Temp:      TempSrc:      Resp: 27 15 25 18   Height:      Weight:      SpO2: 95% 95% 98% 99%   BMET    Component Value Date/Time   NA 137 07/24/2011 1823   K 4.2 07/24/2011 1823   CL 100 07/24/2011 1823   CO2 26 07/24/2011 0350   GLUCOSE 230* 07/24/2011 1823   BUN 18 07/24/2011 1823   CREATININE 1.00 07/24/2011 1823   CALCIUM 7.5* 07/24/2011 0350   GFRNONAA 85* 07/24/2011 1700   GFRAA >90 07/24/2011 1700    CBC    Component Value Date/Time   WBC 11.5* 07/24/2011 1700   RBC 3.17* 07/24/2011 1700   HGB 9.9* 07/24/2011 1823   HCT 29.0* 07/24/2011 1823   PLT 137* 07/24/2011 1700   MCV 89.9 07/24/2011 1700   MCH 29.7 07/24/2011 1700   MCHC 33.0 07/24/2011 1700   RDW 14.9 07/24/2011 1700   LYMPHSABS 3.0 07/16/2011 1601   MONOABS 0.6 07/16/2011 1601   EOSABS 0.5 07/16/2011 1601   BASOSABS 0.0 07/16/2011 1601    Urine output ok  A/P:  Stable day. Continue present course.

## 2011-07-24 NOTE — Plan of Care (Signed)
Problem: Phase III Progression Outcomes Goal: Ambulates with pain/dyspnea controlled Pt ambulated 100 feet with RN.

## 2011-07-24 NOTE — Progress Notes (Signed)
1 Day Post-Op Procedure(s) (LRB): CORONARY ARTERY BYPASS GRAFTING (CABG) (N/A) AORTIC VALVE REPLACEMENT (AVR) (N/A) Subjective: Nausea and pain earlier, better now  Objective: Vital signs in last 24 hours: Temp:  [95.9 F (35.5 C)-99.3 F (37.4 C)] 98.4 F (36.9 C) (12/05 0800) Pulse Rate:  [80-93] 84  (12/05 0800) Cardiac Rhythm:  [-] Heart block (12/05 0800) Resp:  [12-31] 16  (12/05 0800) BP: (86-112)/(32-67) 102/61 mmHg (12/05 0800) SpO2:  [89 %-100 %] 97 % (12/05 0800) FiO2 (%):  [40 %-50 %] 40 % (12/04 2000) Weight:  [100.6 kg (221 lb 12.5 oz)] 221 lb 12.5 oz (100.6 kg) (12/05 0500)  Hemodynamic parameters for last 24 hours: PAP: (15-52)/(5-34) 15/5 mmHg CO:  [4.2 L/min-5.6 L/min] 5.6 L/min CI:  [1.9 L/min/m2-2.5 L/min/m2] 2.5 L/min/m2  Intake/Output from previous day: 12/04 0701 - 12/05 0700 In: 5943.2 [P.O.:140; I.V.:3052.2; Blood:1901; IV Piggyback:850] Out: 9935 [Urine:7040; Blood:2125; Chest Tube:770] Intake/Output this shift: Total I/O In: 20 [I.V.:20] Out: -   Neurologic: intact Heart: regular rate and rhythm Lungs: diminished breath sounds bilaterally Abdomen: normal findings: soft, non-tender  Lab Results:  Basename 07/24/11 0350 07/23/11 2137 07/23/11 2130  WBC 10.5 -- 12.3*  HGB 9.5* 13.3 --  HCT 27.8* 39.0 --  PLT 163 -- 183   BMET:  Basename 07/24/11 0350 07/23/11 2137 07/23/11 0545  NA 141 143 --  K 4.1 4.1 --  CL 106 105 --  CO2 26 -- 25  GLUCOSE 123* 130* --  BUN 14 13 --  CREATININE 0.81 0.80 --  CALCIUM 7.5* -- 9.2    PT/INR:  Basename 07/23/11 1526  LABPROT 16.4*  INR 1.30   ABG    Component Value Date/Time   PHART 7.368 07/23/2011 2303   HCO3 26.4* 07/23/2011 2303   TCO2 28 07/23/2011 2303   O2SAT 90.0 07/23/2011 2303   CBG (last 3)   Basename 07/24/11 0733 07/24/11 0403 07/24/11 0207  GLUCAP 110* 105* 129*    Assessment/Plan: S/P Procedure(s) (LRB): CORONARY ARTERY BYPASS GRAFTING (CABG) (N/A) AORTIC VALVE  REPLACEMENT (AVR) (N/A) Mobilize Diuresis d/c tubes/lines CV: stable, d/c swan, no coumadin unless arrhythmias Resp: IS Renal: diurese DM: CBGs well controlled, continue insulin   LOS: 8 days    Judeth Gilles C 07/24/2011

## 2011-07-24 NOTE — Progress Notes (Signed)
Inpatient Diabetes Program Recommendations  AACE/ADA: New Consensus Statement on Inpatient Glycemic Control (2009)  Target Ranges:  Prepandial:   less than 140 mg/dL      Peak postprandial:   less than 180 mg/dL (1-2 hours)      Critically ill patients:  140 - 180 mg/dL   Reason for Visit: Patient transitioning off insulin drip today post-op.   Inpatient Diabetes Program Recommendations Outpatient Referral: Note A1C=10.7%.  Would benefit from Outpatient diabetes education.  MD please order if appropriate.  Note:

## 2011-07-24 NOTE — Op Note (Signed)
NAME:  Sean [name], Sean [name]                   ACCOUNT NO.:  MEDICAL RECORD NO.:  [id number]  LOCATION:                                 FACILITY:  PHYSICIAN:  Burna Forts, M.D.DATE OF BIRTH:  11-23-1945  DATE OF PROCEDURE:  07/23/2011 DATE OF DISCHARGE:                              OPERATIVE REPORT   Intraoperative transesophageal echocardiographic report.  INDICATIONS FOR PROCEDURE:  Sean [name] is a 65 year old gentleman, a patient of Dr. Kristeen Miss, who presents today per Dr. Tressie Stalker for coronary artery bypass grafting and aortic valve replacement.  After induction of general anesthesia, the TEE probe is prepared and passed oropharyngeally into the stomach, then slightly withdrawn for imaging of the cardiac structures.  PRECARDIOPULMONARY BYPASS TEE EXAMINATION:  Left ventricle:  The left ventricular chamber is seen initially in the short-axis view.  There is mild concentric hypertrophy appreciated.  There is good to excellent overall contractile pattern.  All segmental wall areas were thickened and moving inward during systolic contraction.  Long-axis view shows similar anterior and posterior overall contractility.  Papillary muscles are well outlined. Mitral valve:  Mitral valve is seen initially in the four-chamber view. It is thin, compliant, mobile, and normal appearance and functioning structure.  On color Doppler, there is no mitral regurgitant flow appreciated.  Right ventricle and tricuspid valve:  These structures are normal. 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  This is a normal variant.  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  Close Doppler interrogation reveals no PFO or other shunt obvious. Aortic valve:  A short-axis view of the aortic valve now shows this is a functionally bicuspid valve.  There do appear to  be 3 cusps; however, 2 of these cusps are fused along an area that has heavy calcium.  Thus, it was indeed a functionally bicuspid opening.  The opening valve area by plain imagery is 1.5 to 1.6 cm2 area.  On color Doppler, there is mild aortic insufficiency and very mild aortic stenotic jet.  Therefore, this is a combination lesion of aortic insufficiency and some mild aortic stenosis.  The patient is placed on cardiopulmonary bypass, hypothermia is begun. The procedures are coronary bypass grafting and aortic valve replacement with tissue valve.  De-airing maneuvers were carried out, and the patient is separated from the cardiopulmonary bypass with the initial attempt.  POSTCARDIOPULMONARY BYPASS TEE EXAMINATION:  Aortic valve:  In place of the diseased native valve can now be seen the surrounding structure and edges of the leaflets of the new tissue valve that was in place.  It appeared to be seated well.  Multiple views to look for any jets or aortic insufficiency were used.  There were no areas of insufficiency nor was there any significant obstruction to flow across this valve.  It appeared to be seated well in an entirely appropriate manner. Left ventricle:  Nearly bicuspid.  There is some mild dyssynergy noted in the left ventricular chamber very early.  However, this improved and overall there remained good to excellent contractile pattern in the  post bypass.  The rest of the cardiac examination was as previously described.  The patient was then returned to the cardiac intensive care unit in stable condition.          ______________________________ Burna Forts, M.D.     JTM/MEDQ  D:  07/23/2011  T:  07/24/2011  Job:  [id number]

## 2011-07-24 NOTE — Progress Notes (Signed)
UR Completed.  Sean [name] [phone] 07/24/2011

## 2011-07-25 ENCOUNTER — Inpatient Hospital Stay (HOSPITAL_COMMUNITY): Payer: Medicare Other

## 2011-07-25 DIAGNOSIS — Z951 Presence of aortocoronary bypass graft: Secondary | ICD-10-CM

## 2011-07-25 DIAGNOSIS — Z952 Presence of prosthetic heart valve: Secondary | ICD-10-CM

## 2011-07-25 LAB — GLUCOSE, CAPILLARY
Glucose-Capillary: 184 mg/dL — ABNORMAL HIGH (ref 70–99)
Glucose-Capillary: 149 mg/dL — ABNORMAL HIGH (ref 70–99)
Glucose-Capillary: 134 mg/dL — ABNORMAL HIGH (ref 70–99)
Glucose-Capillary: 142 mg/dL — ABNORMAL HIGH (ref 70–99)
Glucose-Capillary: 141 mg/dL — ABNORMAL HIGH (ref 70–99)
Glucose-Capillary: 149 mg/dL — ABNORMAL HIGH (ref 70–99)
Glucose-Capillary: 195 mg/dL — ABNORMAL HIGH (ref 70–99)

## 2011-07-25 LAB — BASIC METABOLIC PANEL
CO2: 28 mEq/L (ref 19–32)
Calcium: 7.7 mg/dL — ABNORMAL LOW (ref 8.4–10.5)
Chloride: 101 mEq/L (ref 96–112)
Sodium: 135 mEq/L (ref 135–145)

## 2011-07-25 LAB — CBC
MCV: 90.3 fL (ref 78.0–100.0)
Platelets: 115 10*3/uL — ABNORMAL LOW (ref 150–400)
RBC: 2.89 MIL/uL — ABNORMAL LOW (ref 4.22–5.81)
WBC: 11.6 10*3/uL — ABNORMAL HIGH (ref 4.0–10.5)

## 2011-07-25 MED ORDER — FUROSEMIDE 40 MG PO TABS
40.0000 mg | ORAL_TABLET | Freq: Every day | ORAL | Status: DC
  Administered 2011-07-26 – 2011-07-31 (×6): 40 mg via ORAL
  Filled 2011-07-25 (×7): qty 1

## 2011-07-25 MED ORDER — INSULIN GLARGINE 100 UNIT/ML ~~LOC~~ SOLN: 56.0000 [IU] | SUBCUTANEOUS | Status: DC

## 2011-07-25 MED ORDER — FUROSEMIDE 40 MG PO TABS: 40.0000 mg | ORAL_TABLET | Freq: Every day | ORAL | Status: DC

## 2011-07-25 MED ORDER — DEXTROSE 50 % IV SOLN: 25.0000 mL | INTRAVENOUS | Status: DC | PRN

## 2011-07-25 MED ORDER — INSULIN ASPART 100 UNIT/ML ~~LOC~~ SOLN
10.0000 [IU] | Freq: Three times a day (TID) | SUBCUTANEOUS | Status: DC
  Administered 2011-07-26 (×2): 10 [IU] via SUBCUTANEOUS

## 2011-07-25 MED ORDER — INSULIN GLARGINE 100 UNIT/ML ~~LOC~~ SOLN
16.0000 [IU] | Freq: Every day | SUBCUTANEOUS | Status: AC
  Administered 2011-07-25: 16 [IU] via SUBCUTANEOUS

## 2011-07-25 MED ORDER — METOPROLOL TARTRATE 25 MG/10 ML ORAL SUSPENSION
12.5000 mg | Freq: Three times a day (TID) | ORAL | Status: DC
  Administered 2011-07-25: 12.5 mg
  Filled 2011-07-25 (×4): qty 5

## 2011-07-25 MED ORDER — INSULIN ASPART 100 UNIT/ML ~~LOC~~ SOLN
3.0000 [IU] | Freq: Three times a day (TID) | SUBCUTANEOUS | Status: DC
  Administered 2011-07-25 (×2): 3 [IU] via SUBCUTANEOUS

## 2011-07-25 MED ORDER — POTASSIUM CHLORIDE CRYS ER 20 MEQ PO TBCR
20.0000 meq | EXTENDED_RELEASE_TABLET | Freq: Two times a day (BID) | ORAL | Status: DC
  Administered 2011-07-25 – 2011-07-31 (×13): 20 meq via ORAL
  Filled 2011-07-25 (×15): qty 1

## 2011-07-25 MED ORDER — METOPROLOL TARTRATE 12.5 MG HALF TABLET
12.5000 mg | ORAL_TABLET | Freq: Three times a day (TID) | ORAL | Status: DC
  Administered 2011-07-26: 12.5 mg via ORAL
  Filled 2011-07-25 (×4): qty 1

## 2011-07-25 MED ORDER — SODIUM CHLORIDE 0.9 % IV SOLN: INTRAVENOUS | Status: DC

## 2011-07-25 MED ORDER — INSULIN ASPART 100 UNIT/ML ~~LOC~~ SOLN
0.0000 [IU] | SUBCUTANEOUS | Status: DC
  Administered 2011-07-25: 2 [IU] via SUBCUTANEOUS
  Administered 2011-07-25: 4 [IU] via SUBCUTANEOUS
  Administered 2011-07-25: 8 [IU] via SUBCUTANEOUS
  Administered 2011-07-26 (×3): 2 [IU] via SUBCUTANEOUS
  Administered 2011-07-26: 4 [IU] via SUBCUTANEOUS

## 2011-07-25 MED ORDER — INSULIN REGULAR HUMAN 100 UNIT/ML IJ SOLN
INTRAMUSCULAR | Status: DC
  Administered 2011-07-25 (×2): via INTRAVENOUS
  Filled 2011-07-25: qty 1

## 2011-07-25 MED ORDER — INSULIN GLARGINE 100 UNIT/ML ~~LOC~~ SOLN
56.0000 [IU] | SUBCUTANEOUS | Status: DC
  Administered 2011-07-26: 56 [IU] via SUBCUTANEOUS

## 2011-07-25 MED ORDER — FUROSEMIDE 10 MG/ML IJ SOLN
40.0000 mg | Freq: Once | INTRAMUSCULAR | Status: AC
  Administered 2011-07-25: 40 mg via INTRAVENOUS
  Filled 2011-07-25: qty 4

## 2011-07-25 NOTE — Progress Notes (Signed)
Inpatient Diabetes Program Recommendations  AACE/ADA: New Consensus Statement on Inpatient Glycemic Control (2009)  Target Ranges:  Prepandial:   less than 140 mg/dL      Peak postprandial:   less than 180 mg/dL (1-2 hours)      Critically ill patients:  140 - 180 mg/dL   Reason for Visit: Patient transitioning off insulin drip. Spoke briefly with patient regarding diabetes.  He see's MD at [facility] for diabetes and states this has helped.  Will re-visit patient 07/26/11 to discuss diabetes further.    Inpatient Diabetes Program Recommendations Insulin - Meal Coverage: Increase Novolog meal converage to 10 units tid with meals. Outpatient Referral: Note A1C=10.7%.  Would benefit from Outpatient diabetes education.  MD please order if appropriate.

## 2011-07-25 NOTE — Plan of Care (Signed)
Problem: Phase II Progression Outcomes Goal: CBGs/Blood glucose < or equal to 120 Not met, still hyperglycemic

## 2011-07-25 NOTE — Progress Notes (Signed)
Patient ID: Sean [name], male   DOB: Aug 06, 1946, 65 y.o.   MRN: [id number]                    [address]            [place] [address]          [phone]       Sean [name] [facility] Medical Record #[id number] Date of Birth: 05/28/1946  Referring: No ref. provider found Primary Care: No primary provider on file.  Chief Complaint:    Chief Complaint  Patient presents with  . Loss of Consciousness    History of Present Illness:      Co of head ache today   Past Medical History  Diagnosis Date  . Myocardial infarction   . Ventricular tachycardia   . Diabetes mellitus   . COPD (chronic obstructive pulmonary disease)     Past Surgical History  Procedure Date  . Pacemaker insertion   . Nose surgery     skin cancer  . Cardiac catheterization   . Coronary angioplasty   . Tee without cardioversion 07/22/2011    Procedure: TRANSESOPHAGEAL ECHOCARDIOGRAM (TEE);  Surgeon: Lewayne Bunting, MD;  Location: [facility] ENDOSCOPY;  Service: Cardiovascular;  Laterality: N/A;    History  Smoking status  . Current Everyday Smoker -- 1.0 packs/day  Smokeless tobacco  . Not on file        Allergies  Allergen Reactions  . Novocain Other (See Comments)    unknown  . Rosuvastatin Other (See Comments)    Muscle pain    Current Facility-Administered Medications  Medication Dose Route Frequency Provider Last Rate Last Dose  . 0.45 % sodium chloride infusion   Intravenous Continuous Purcell Nails, MD 20 mL/hr at 07/24/11 2151    . 0.9 %  sodium chloride infusion   Intravenous Continuous Purcell Nails, MD      . 0.9 %  sodium chloride infusion  250 mL Intravenous Continuous Purcell Nails, MD      . 0.9 %  sodium chloride infusion   Intravenous Continuous Flossie Buffy, Charity fundraiser      . acetaminophen (TYLENOL) tablet 1,000 mg  1,000 mg Oral Q6H Purcell Nails, MD   1,000 mg at 07/25/11 1754   Or  . acetaminophen (TYLENOL) solution 975 mg  975 mg Per  Tube Q6H Purcell Nails, MD      . ammonium lactate (LAC-HYDRIN) 12 % lotion   Topical Daily PRN Christella Hartigan, PHARMD      . aspirin EC tablet 325 mg  325 mg Oral Daily Purcell Nails, MD   325 mg at 07/25/11 1011   Or  . aspirin chewable tablet 324 mg  324 mg Per Tube Daily Purcell Nails, MD   324 mg at 07/24/11 1027  . bisacodyl (DULCOLAX) EC tablet 10 mg  10 mg Oral Daily Purcell Nails, MD   10 mg at 07/25/11 1212   Or  . bisacodyl (DULCOLAX) suppository 10 mg  10 mg Rectal Daily Purcell Nails, MD      . budesonide-formoterol [facility]) 160-4.5 MCG/ACT inhaler 2 puff  2 puff Inhalation BID Darrol Jump, PA   2 puff at 07/25/11 0835  . cefUROXime (ZINACEF) 1.5 g in dextrose 5 % 50 mL IVPB  1.5 g Intravenous Q12H Purcell Nails, MD   1.5 g at 07/24/11 2151  . dextrose 50 % solution  25 mL  25 mL Intravenous PRN Flossie Buffy, Charity fundraiser      . docusate sodium (COLACE) capsule 200 mg  200 mg Oral Daily Purcell Nails, MD   200 mg at 07/25/11 1011  . enoxaparin (LOVENOX) injection 40 mg  40 mg Subcutaneous QHS Loreli Slot, MD   40 mg at 07/24/11 2154  . famotidine (PEPCID) IVPB 20 mg  20 mg Intravenous Q12H Purcell Nails, MD   20 mg at 07/24/11 1002  . fluticasone (FLONASE) 50 MCG/ACT nasal spray 2 spray  2 spray Each Nare Daily Elyn Aquas., MD   2 spray at 07/24/11 1254  . furosemide (LASIX) injection 40 mg  40 mg Intravenous Once Loreli Slot, MD   40 mg at 07/25/11 1014  . furosemide (LASIX) tablet 40 mg  40 mg Oral Daily Loreli Slot, MD      . gabapentin (NEURONTIN) capsule 100 mg  100 mg Oral TID Darrol Jump, PA   100 mg at 07/25/11 1600  . insulin aspart (novoLOG) injection 0-24 Units  0-24 Units Subcutaneous Q4H Loreli Slot, MD   8 Units at 07/25/11 1600  . insulin aspart (novoLOG) injection 3 Units  3 Units Subcutaneous TID WC Loreli Slot, MD   3 Units at 07/25/11 1825  . insulin glargine (LANTUS) injection 16 Units   16 Units Subcutaneous Daily Loreli Slot, MD   16 Units at 07/25/11 [id number]  . insulin glargine (LANTUS) injection 56 Units  56 Units Subcutaneous Q0700 Loreli Slot, MD      . metoprolol (LOPRESSOR) injection 2.5-5 mg  2.5-5 mg Intravenous Q2H PRN Purcell Nails, MD      . metoprolol tartrate (LOPRESSOR) tablet 12.5 mg  12.5 mg Oral BID Purcell Nails, MD   12.5 mg at 07/25/11 1011   Or  . metoprolol tartrate (LOPRESSOR) 25 mg/10 mL oral suspension 12.5 mg  12.5 mg Per Tube BID Purcell Nails, MD      . midazolam (VERSED) injection 2 mg  2 mg Intravenous Q1H PRN Purcell Nails, MD      . morphine 4 MG/ML injection 2-5 mg  2-5 mg Intravenous Q1H PRN Purcell Nails, MD   4 mg at 07/25/11 0044  . nitroGLYCERIN 0.2 mg/mL in dextrose 5 % infusion  0-100 mcg/min Intravenous Continuous Purcell Nails, MD 2 mL/hr at 07/23/11 1800 6.667 mcg/min at 07/23/11 1800  . ondansetron (ZOFRAN) injection 4 mg  4 mg Intravenous Q6H PRN Purcell Nails, MD   4 mg at 07/24/11 0432  . oxyCODONE (Oxy IR/ROXICODONE) immediate release tablet 5-10 mg  5-10 mg Oral Q3H PRN Purcell Nails, MD   5 mg at 07/25/11 1745  . pantoprazole (PROTONIX) EC tablet 40 mg  40 mg Oral Q1200 Purcell Nails, MD   40 mg at 07/25/11 1213  . potassium chloride SA (K-DUR,KLOR-CON) CR tablet 20 mEq  20 mEq Oral BID Loreli Slot, MD   20 mEq at 07/25/11 1012  . simvastatin (ZOCOR) tablet 20 mg  20 mg Oral QHS Darrol Jump, PA   20 mg at 07/24/11 2153  . sodium chloride 0.9 % injection 3 mL  3 mL Intravenous Q12H Purcell Nails, MD   3 mL at 07/25/11 1000  . sodium chloride 0.9 % injection 3 mL  3 mL Intravenous PRN Purcell Nails, MD   3 mL at 07/25/11 1030  . DISCONTD: dexmedetomidine (  PRECEDEX) 200 mcg in sodium chloride 0.9 % 50 mL infusion  0.1-0.7 mcg/kg/hr Intravenous Continuous Purcell Nails, MD 2.4 mL/hr at 07/23/11 1900 0.1 mcg/kg/hr at 07/23/11 1900  . DISCONTD: furosemide (LASIX) tablet 40 mg  40 mg  Oral Daily Loreli Slot, MD      . DISCONTD: insulin aspart (novoLOG) injection 0-24 Units  0-24 Units Subcutaneous Q4H Loreli Slot, MD   8 Units at 07/24/11 2010  . DISCONTD: insulin aspart (novoLOG) injection 3 Units  3 Units Subcutaneous TID WC Loreli Slot, MD   3 Units at 07/24/11 1804  . DISCONTD: insulin glargine (LANTUS) injection 40 Units  40 Units Subcutaneous Q0700 Loreli Slot, MD   40 Units at 07/25/11 0745  . DISCONTD: insulin glargine (LANTUS) injection 56 Units  56 Units Subcutaneous Q0700 Loreli Slot, MD      . DISCONTD: insulin regular (HUMULIN R,NOVOLIN R) 1 Units/mL in sodium chloride 0.9 % 100 mL infusion   Intravenous Continuous Flossie Buffy, Charity fundraiser      . DISCONTD: lactated ringers infusion   Intravenous Continuous Purcell Nails, MD 40 mL/hr at 07/23/11 1900    . DISCONTD: phenylephrine (NEO-SYNEPHRINE) 20,000 mcg in dextrose 5 % 250 mL infusion  0-100 mcg/min Intravenous Continuous Purcell Nails, MD 7.5 mL/hr at 07/24/11 0300 10 mcg/min at 07/24/11 0300    Prescriptions prior to admission  Medication Sig Dispense Refill  . ammonium lactate (AMLACTIN) 12 % cream Apply topically as needed.        Marland Kitchen aspirin 81 MG tablet Take 81 mg by mouth daily.        Marland Kitchen atenolol (TENORMIN) 100 MG tablet Take 100 mg by mouth daily.        Marland Kitchen atorvastatin (LIPITOR) 10 MG tablet Take 10 mg by mouth daily.        . budesonide-formoterol (SYMBICORT) 160-4.5 MCG/ACT inhaler Inhale 2 puffs into the lungs 2 (two) times daily.        . cholecalciferol (D-VI-SOL) 400 UNIT/ML LIQD Take 400 Units by mouth daily.        . flunisolide (NASALIDE) 0.025 % SOLN Inhale 2 sprays into the lungs 2 (two) times daily.        Marland Kitchen gabapentin (NEURONTIN) 100 MG capsule Take 100 mg by mouth 3 (three) times daily.        Marland Kitchen HYDROcodone-acetaminophen (NORCO) 10-325 MG per tablet Take 1 tablet by mouth every 6 (six) hours as needed.        . insulin aspart (NOVOLOG) 100  UNIT/ML injection Inject 12 Units into the skin 3 (three) times daily before meals.       . insulin glargine (LANTUS) 100 UNIT/ML injection Inject 56 Units into the skin at bedtime.        Marland Kitchen albuterol (PROVENTIL HFA;VENTOLIN HFA) 108 (90 BASE) MCG/ACT inhaler Inhale 2 puffs into the lungs every 6 (six) hours as needed.             Physical Exam: BP 119/63  Pulse 95  Temp(Src) 97.6 F (36.4 C) (Oral)  Resp 22  Ht 6' (1.829 m)  Wt 220 lb 14.4 oz (100.2 kg)  BMI 29.96 kg/m2  SpO2 96%  General appearance: alert, cooperative and no distress Neurologic: intact Heart: regular rate and rhythm, S1, S2 normal, no murmur, click, rub or gallop Lungs: clear to auscultation bilaterally Abdomen: soft, non-tender; bowel sounds normal; no masses,  no organomegaly Extremities: extremities normal, atraumatic, no cyanosis or edema  Wound: stable sternum   Diagnostic Studies & Laboratory data:     Recent Radiology Findings:   Dg Chest Portable 1 View In Am  07/25/2011  *RADIOLOGY REPORT*  Clinical Data: Postop open heart surgery  PORTABLE CHEST - 1 VIEW  Comparison: Portable chest x-ray of 07/24/2011  Findings: The Swan-Ganz catheter has been removed and a venous sheath is present within the SVC.  No pneumothorax is seen. Aeration has improved slightly.  There is basilar atelectasis persisting left greater than right with left pleural effusion. Cardiomegaly is stable and pacer with AICD lead remain.  IMPRESSION: Swan-Ganz catheter removed.  No pneumothorax.  Persistent basilar opacities left greater than right consistent with atelectasis and effusion.  Original Report Authenticated By: Juline Patch, M.D.   Dg Chest Portable 1 View In Am  07/24/2011  *RADIOLOGY REPORT*  Clinical Data: Postop cardiac surgery, follow-up  PORTABLE CHEST - 1 VIEW  Comparison: Portable chest x-ray of 07/23/2011  Findings: Of the endotracheal tube has been removed.  Swan-Ganz catheter chest tube remain.  Aeration of the lungs  has not change significantly.  There is basilar atelectasis postoperatively left greater than right remain.  Cardiomegaly is stable.  Permanent pacemaker with AICD lead remains.  IMPRESSION:  1.  Endotracheal tube removed. 2.  Little change in aeration with basilar opacities most consistent with atelectasis postoperatively left greater than right.  Original Report Authenticated By: Juline Patch, M.D.      Recent Lab Findings: Lab Results  Component Value Date   WBC 11.6* 07/25/2011   HGB 8.6* 07/25/2011   HCT 26.1* 07/25/2011   PLT 115* 07/25/2011   GLUCOSE 156* 07/25/2011   ALT 18 07/16/2011   AST 20 07/16/2011   NA 135 07/25/2011   K 4.0 07/25/2011   CL 101 07/25/2011   CREATININE 0.86 07/25/2011   BUN 16 07/25/2011   CO2 28 07/25/2011   TSH 0.802 07/16/2011   INR 1.30 07/23/2011   HGBA1C 10.7* 07/17/2011      Assessment / Plan:     Increase insulin coverage Patient is now post op in SICU [facility], could go in few days if stable       Delight Ovens MD 07/25/2011 6:53 PM

## 2011-07-25 NOTE — Progress Notes (Signed)
2 Days Post-Op Procedure(s) (LRB): CORONARY ARTERY BYPASS GRAFTING (CABG) (N/A) AORTIC VALVE REPLACEMENT (AVR) (N/A) Subjective: A little dizzy this AM Concerned CBG may be too low (it is 120) Pain mild  Objective: Vital signs in last 24 hours: Temp:  [97.9 F (36.6 C)-98.6 F (37 C)] 98.1 F (36.7 C) (12/06 0728) Pulse Rate:  [33-97] 88  (12/06 0800) Cardiac Rhythm:  [-] Normal sinus rhythm;Heart block (12/06 0400) Resp:  [14-34] 14  (12/06 0800) BP: (98-139)/(50-63) 110/58 mmHg (12/06 0800) SpO2:  [73 %-100 %] 98 % (12/06 0800) FiO2 (%):  [2 %-5 %] 5 % (12/05 2000) Weight:  [100.2 kg (220 lb 14.4 oz)] 220 lb 14.4 oz (100.2 kg) (12/06 0600)  Hemodynamic parameters for last 24 hours: PAP: (14-24)/(3-11) 24/11 mmHg  Intake/Output from previous day: 12/05 0701 - 12/06 0700 In: 1405.1 [P.O.:660; I.V.:495.1; IV Piggyback:250] Out: 1765 [Urine:1765] Intake/Output this shift: Total I/O In: 260 [P.O.:240; I.V.:20] Out: 45 [Urine:45]  General appearance: alert and no distress Neurologic: intact Heart: regular rate and rhythm Lungs: diminished breath sounds bibasilar Abdomen: normal findings: soft, non-tender  Lab Results:  Basename 07/25/11 0355 07/24/11 1823 07/24/11 1700  WBC 11.6* -- 11.5*  HGB 8.6* 9.9* --  HCT 26.1* 29.0* --  PLT 115* -- 137*   BMET:  Basename 07/25/11 0355 07/24/11 1823 07/24/11 0350  NA 135 137 --  K 4.0 4.2 --  CL 101 100 --  CO2 28 -- 26  GLUCOSE 156* 230* --  BUN 16 18 --  CREATININE 0.86 1.00 --  CALCIUM 7.7* -- 7.5*    PT/INR:  Basename 07/23/11 1526  LABPROT 16.4*  INR 1.30   ABG    Component Value Date/Time   PHART 7.368 07/23/2011 2303   HCO3 26.4* 07/23/2011 2303   TCO2 26 07/24/2011 1823   O2SAT 90.0 07/23/2011 2303   CBG (last 3)   Basename 07/25/11 0736 07/25/11 0703 07/25/11 0558  GLUCAP 124* 141* 142*    Assessment/Plan: S/P Procedure(s) (LRB): CORONARY ARTERY BYPASS GRAFTING (CABG) (N/A) AORTIC VALVE  REPLACEMENT (AVR) (N/A) Mobilize Diuresis Diabetes control Plan for transfer to step-down: see transfer orders- possibly later today Hyperglycemia/ DM- back on insulin gtt overnight, transition back to lantus/ SSI    LOS: 9 days    Sean [name] C 07/25/2011

## 2011-07-26 ENCOUNTER — Inpatient Hospital Stay (HOSPITAL_COMMUNITY): Payer: Medicare Other

## 2011-07-26 ENCOUNTER — Encounter (HOSPITAL_COMMUNITY): Payer: Self-pay | Admitting: Emergency Medicine

## 2011-07-26 LAB — CBC
HCT: 24.3 % — ABNORMAL LOW (ref 39.0–52.0)
Hemoglobin: 8.1 g/dL — ABNORMAL LOW (ref 13.0–17.0)
MCV: 90.3 fL (ref 78.0–100.0)
RDW: 14.8 % (ref 11.5–15.5)
WBC: 11.6 10*3/uL — ABNORMAL HIGH (ref 4.0–10.5)

## 2011-07-26 LAB — BASIC METABOLIC PANEL
BUN: 15 mg/dL (ref 6–23)
CO2: 29 mEq/L (ref 19–32)
Chloride: 98 mEq/L (ref 96–112)
Creatinine, Ser: 0.81 mg/dL (ref 0.50–1.35)
GFR calc Af Amer: >90 mL/min (ref >90)
Glucose, Bld: 148 mg/dL — ABNORMAL HIGH (ref 70–99)

## 2011-07-26 LAB — GLUCOSE, CAPILLARY
Glucose-Capillary: 137 mg/dL — ABNORMAL HIGH (ref 70–99)
Glucose-Capillary: 174 mg/dL — ABNORMAL HIGH (ref 70–99)
Glucose-Capillary: 156 mg/dL — ABNORMAL HIGH (ref 70–99)
Glucose-Capillary: 98 mg/dL (ref 70–99)

## 2011-07-26 MED ORDER — BISACODYL 10 MG RE SUPP: 10.0000 mg | Freq: Every day | RECTAL | Status: DC | PRN

## 2011-07-26 MED ORDER — INSULIN GLARGINE 100 UNIT/ML ~~LOC~~ SOLN
56.0000 [IU] | Freq: Every day | SUBCUTANEOUS | Status: DC
  Administered 2011-07-26 – 2011-07-30 (×5): 56 [IU] via SUBCUTANEOUS
  Filled 2011-07-26: qty 3

## 2011-07-26 MED ORDER — ATENOLOL 25 MG PO TABS
25.0000 mg | ORAL_TABLET | Freq: Every day | ORAL | Status: DC
  Administered 2011-07-26 – 2011-07-31 (×6): 25 mg via ORAL
  Filled 2011-07-26 (×6): qty 1

## 2011-07-26 MED ORDER — MAGNESIUM HYDROXIDE 400 MG/5ML PO SUSP: 30.0000 mL | Freq: Every day | ORAL | Status: DC | PRN

## 2011-07-26 MED ORDER — BISACODYL 5 MG PO TBEC: 10.0000 mg | DELAYED_RELEASE_TABLET | Freq: Every day | ORAL | Status: DC | PRN

## 2011-07-26 MED ORDER — SODIUM CHLORIDE 0.9 % IJ SOLN
3.0000 mL | INTRAMUSCULAR | Status: DC | PRN
  Administered 2011-07-28: 3 mL via INTRAVENOUS

## 2011-07-26 MED ORDER — GUAIFENESIN-DM 100-10 MG/5ML PO SYRP
15.0000 mL | ORAL_SOLUTION | ORAL | Status: DC | PRN
Start: 2011-07-26 — End: 2011-07-31

## 2011-07-26 MED ORDER — TRAMADOL HCL 50 MG PO TABS
50.0000 mg | ORAL_TABLET | ORAL | Status: DC | PRN
  Administered 2011-07-26 (×2): 100 mg via ORAL
  Administered 2011-07-28: 50 mg via ORAL
  Administered 2011-07-30 – 2011-07-31 (×4): 100 mg via ORAL
  Filled 2011-07-26 (×6): qty 2
  Filled 2011-07-26: qty 1

## 2011-07-26 MED ORDER — ACETAMINOPHEN 325 MG PO TABS: 650.0000 mg | ORAL_TABLET | Freq: Four times a day (QID) | ORAL | Status: DC | PRN

## 2011-07-26 MED ORDER — POVIDONE-IODINE 10 % EX SOLN
1.0000 "application " | Freq: Two times a day (BID) | CUTANEOUS | Status: DC
  Administered 2011-07-26: 1 via TOPICAL

## 2011-07-26 MED ORDER — MOVING RIGHT ALONG BOOK
Freq: Once | Status: AC
  Administered 2011-07-26: 13:00:00
  Filled 2011-07-26: qty 1

## 2011-07-26 MED ORDER — CYCLOBENZAPRINE HCL 10 MG PO TABS
10.0000 mg | ORAL_TABLET | Freq: Three times a day (TID) | ORAL | Status: DC | PRN
  Administered 2011-07-26 – 2011-07-28 (×6): 10 mg via ORAL
  Filled 2011-07-26 (×6): qty 1

## 2011-07-26 MED ORDER — DOCUSATE SODIUM 100 MG PO CAPS: 200.0000 mg | ORAL_CAPSULE | Freq: Every day | ORAL | Status: DC

## 2011-07-26 MED ORDER — SODIUM CHLORIDE 0.9 % IJ SOLN
3.0000 mL | Freq: Two times a day (BID) | INTRAMUSCULAR | Status: DC
  Administered 2011-07-26 – 2011-07-31 (×10): 3 mL via INTRAVENOUS

## 2011-07-26 MED ORDER — OXYCODONE HCL 5 MG PO TABS
5.0000 mg | ORAL_TABLET | ORAL | Status: DC | PRN
  Administered 2011-07-26: 5 mg via ORAL
  Administered 2011-07-27 – 2011-07-29 (×6): 10 mg via ORAL
  Filled 2011-07-26 (×5): qty 2
  Filled 2011-07-26: qty 1
  Filled 2011-07-26: qty 2

## 2011-07-26 MED ORDER — INSULIN ASPART 100 UNIT/ML ~~LOC~~ SOLN
0.0000 [IU] | Freq: Three times a day (TID) | SUBCUTANEOUS | Status: DC
  Administered 2011-07-26: 2 [IU] via SUBCUTANEOUS
  Administered 2011-07-27: 8 [IU] via SUBCUTANEOUS
  Administered 2011-07-27: 4 [IU] via SUBCUTANEOUS
  Administered 2011-07-27 – 2011-07-28 (×2): 2 [IU] via SUBCUTANEOUS
  Administered 2011-07-28: 4 [IU] via SUBCUTANEOUS
  Administered 2011-07-28: 2 [IU] via SUBCUTANEOUS
  Administered 2011-07-29 (×3): 4 [IU] via SUBCUTANEOUS
  Administered 2011-07-29 – 2011-07-30 (×5): 12 [IU] via SUBCUTANEOUS
  Administered 2011-07-31: 8 [IU] via SUBCUTANEOUS

## 2011-07-26 MED ORDER — ZOLPIDEM TARTRATE 5 MG PO TABS: 5.0000 mg | ORAL_TABLET | Freq: Every evening | ORAL | Status: DC | PRN

## 2011-07-26 MED ORDER — KETOROLAC TROMETHAMINE 15 MG/ML IJ SOLN
15.0000 mg | Freq: Four times a day (QID) | INTRAMUSCULAR | Status: AC | PRN
Start: 2011-07-26 — End: 2011-07-29
  Administered 2011-07-26 – 2011-07-28 (×4): 15 mg via INTRAVENOUS
  Filled 2011-07-26 (×6): qty 1

## 2011-07-26 MED ORDER — SODIUM CHLORIDE 0.9 % IV SOLN: 250.0000 mL | INTRAVENOUS | Status: DC | PRN

## 2011-07-26 MED ORDER — LIVING WELL WITH DIABETES BOOK
Freq: Once | Status: AC
  Administered 2011-07-26: 1
  Filled 2011-07-26: qty 1

## 2011-07-26 MED FILL — Magnesium Sulfate Inj 50%: INTRAMUSCULAR | Qty: 10 | Status: AC

## 2011-07-26 MED FILL — Potassium Chloride Inj 2 mEq/ML: INTRAVENOUS | Qty: 40 | Status: AC

## 2011-07-26 NOTE — Progress Notes (Signed)
Spoke to patient briefly regarding his diabetes.  He is interested in "outpatient diabetes education" after discharge.  CBG's better today.  Will order "living well with diabetes" booklet and continue to monitor.  He see's MD at [facility] for his diabetes. A1c=10.7%.  Will need follow-up re. Diabetes after discharge.

## 2011-07-26 NOTE — Progress Notes (Signed)
Subjective:    Sean [name] is a 65 year old gentleman with a history of coronary artery disease, aortic stenosis, and ventricular tachycardia. He was admitted after having ventricular fibrillation with several AICD shocks. Cardiac catheterization revealed severe coronary artery disease and moderate to severe aortic stenosis and ultimately had coronary artery bypass grafting with aortic valve replacement.  He's done fairly well since that time.      Marland Kitchen acetaminophen  1,000 mg Oral Q6H   Or  . acetaminophen (TYLENOL) oral liquid 160 mg/5 mL  975 mg Per Tube Q6H  . aspirin EC  325 mg Oral Daily   Or  . aspirin  324 mg Per Tube Daily  . bisacodyl  10 mg Oral Daily   Or  . bisacodyl  10 mg Rectal Daily  . budesonide-formoterol  2 puff Inhalation BID  . cefUROXime (ZINACEF)  IV  1.5 g Intravenous Q12H  . docusate sodium  200 mg Oral Daily  . enoxaparin  40 mg Subcutaneous QHS  . fluticasone  2 spray Each Nare Daily  . furosemide  40 mg Intravenous Once  . furosemide  40 mg Oral Daily  . gabapentin  100 mg Oral TID  . insulin aspart  0-24 Units Subcutaneous Q4H  . insulin aspart  10 Units Subcutaneous TID WC  . insulin glargine  16 Units Subcutaneous Daily  . insulin glargine  56 Units Subcutaneous Q0700  . metoprolol tartrate  12.5 mg Oral TID   Or  . metoprolol tartrate  12.5 mg Per Tube TID  . pantoprazole  40 mg Oral Q1200  . potassium chloride  20 mEq Oral BID  . simvastatin  20 mg Oral QHS  . sodium chloride  3 mL Intravenous Q12H  . DISCONTD: furosemide  40 mg Oral Daily  . DISCONTD: insulin aspart  3 Units Subcutaneous TID WC  . DISCONTD: insulin glargine  40 Units Subcutaneous Q0700  . DISCONTD: insulin glargine  56 Units Subcutaneous Q0700  . DISCONTD: metoprolol tartrate  12.5 mg Per Tube BID  . DISCONTD: metoprolol tartrate  12.5 mg Oral BID      . sodium chloride 20 mL/hr at 07/24/11 2151  . sodium chloride    . sodium chloride    . sodium chloride    .  nitroGLYCERIN 6.667 mcg/min (07/23/11 1800)  . DISCONTD: dexmedetomidine (PRECEDEX) IV infusion 0.1 mcg/kg/hr (07/23/11 1900)  . DISCONTD: insulin (NOVOLIN-R) infusion    . DISCONTD: lactated ringers 40 mL/hr at 07/23/11 1900  . DISCONTD: phenylephrine (NEO-SYNEPHRINE) Adult infusion 10 mcg/min (07/24/11 0300)    Objective:  Vital Signs in the last 24 hours: Blood pressure 113/67, pulse 100, temperature 97.8 F (36.6 C), temperature source Oral, resp. rate 16, height 6' (1.829 m), weight 218 lb 7.6 oz (99.1 kg), SpO2 96.00%. Temp:  [97.6 F (36.4 C)-98.9 F (37.2 C)] 97.8 F (36.6 C) (12/07 0738) Pulse Rate:  [74-103] 100  (12/07 0700) Resp:  [14-28] 16  (12/07 0700) BP: (101-125)/(51-68) 113/67 mmHg (12/07 0700) SpO2:  [91 %-100 %] 96 % (12/07 0700) Weight:  [218 lb 7.6 oz (99.1 kg)] 218 lb 7.6 oz (99.1 kg) (12/07 0400)  Intake/Output from previous day: 12/06 0701 - 12/07 0700 In: 1378.3 [P.O.:1090; I.V.:288.3] Out: 2795 [Urine:2795] Intake/Output from this shift:    Physical Exam: The patient is alert and oriented x 3.  The mood and affect are normal.   Skin: warm and dry.  Color is normal.    HEENT:   the sclera are  nonicteric.  The mucous membranes are moist.  The carotids are 2+ without bruits.  There is no thyromegaly.  There is no JVD.    Lungs: clear.  The chest wall is non tender.    Heart: Regular rate. He has a soft systolic murmur. His sternotomy is healing well.  Abdomen: good bowel sounds.  There is no guarding or rebound.  There is no hepatosplenomegaly or tenderness.  There are no masses.   Extremities:  no clubbing, cyanosis, or edema.  The legs are without rashes.  The distal pulses are intact.   Neuro:  Cranial nerves II - XII are intact.  Motor and sensory functions are intact.     Lab Results:  Basename 07/26/11 0400 07/25/11 0355  WBC 11.6* 11.6*  HGB 8.1* 8.6*  PLT 108* 115*    Basename 07/26/11 0400 07/25/11 0355  NA 135 135  K 4.1 4.0    CL 98 101  CO2 29 28  GLUCOSE 148* 156*  BUN 15 16  CREATININE 0.81 0.86   No results found for this basename: TROPONINI:2,CK,MB:2 in the last 72 hours No results found for this basename: BNP in the last 72 hours Hepatic Function Panel No results found for this basename: PROT,ALBUMIN,AST,ALT,ALKPHOS,BILITOT,BILIDIR,IBILI in the last 72 hours No results found for this basename: CHOL, HDL, LDLCALC, LDLDIRECT, TRIG, CHOLHDL    Basename 07/23/11 1526  INR 1.30   Telemetry: Normal sinus rhythm with first degree AV block  Assessment/Plan:   1. Coronary artery disease: The patient is status post coronary artery bypass grafting. He's made good progress. He should be able to go to unit 2000 today.  2. Aortic Stenosis:  He's doing well after his aortic valve surgery. Continue with same medications.  3. Ventricular fibrillation: He's doing well. He's not had any further AICD shocks. I suspect these were from coronary ischemia.  4. Diabetes mellitus-stable  5. Hyperlipidemia:-Stable    Alvia Grove., MD, [facility] 07/26/2011, 7:57 AM LOS: Day 10

## 2011-07-26 NOTE — Significant Event (Signed)
Pt transferred to 2010 via wheelchair. VS stable prior to transfer. No belongings at bedside. Family aware of transfer. Report given to receiving RN. Dorann Davidson, Charity fundraiser.

## 2011-07-26 NOTE — Progress Notes (Signed)
3 Days Post-Op Procedure(s) (LRB): CORONARY ARTERY BYPASS GRAFTING (CABG) (N/A) AORTIC VALVE REPLACEMENT (AVR) (N/A) Subjective: C/o headache this AM   Objective: Vital signs in last 24 hours: Temp:  [97.6 F (36.4 C)-98.9 F (37.2 C)] 97.8 F (36.6 C) (12/07 0738) Pulse Rate:  [74-103] 100  (12/07 0700) Cardiac Rhythm:  [-] Normal sinus rhythm (12/07 0700) Resp:  [16-28] 16  (12/07 0700) BP: (101-125)/(51-68) 113/67 mmHg (12/07 0700) SpO2:  [91 %-100 %] 94 % (12/07 0836) Weight:  [99.1 kg (218 lb 7.6 oz)] 218 lb 7.6 oz (99.1 kg) (12/07 0400)  Hemodynamic parameters for last 24 hours:    Intake/Output from previous day: 12/06 0701 - 12/07 0700 In: 1378.3 [P.O.:1090; I.V.:288.3] Out: 2795 [Urine:2795] Intake/Output this shift:    General appearance: alert and no distress Neurologic: intact Heart: regular rate and rhythm Lungs: clear to auscultation bilaterally Abdomen: normal findings: umbilicus normal and disregard, soft nontender and abnormal findings:  hypoactive bowel sounds  Lab Results:  Basename 07/26/11 0400 07/25/11 0355  WBC 11.6* 11.6*  HGB 8.1* 8.6*  HCT 24.3* 26.1*  PLT 108* 115*   BMET:  Basename 07/26/11 0400 07/25/11 0355  NA 135 135  K 4.1 4.0  CL 98 101  CO2 29 28  GLUCOSE 148* 156*  BUN 15 16  CREATININE 0.81 0.86  CALCIUM 8.3* 7.7*    PT/INR:  Basename 07/23/11 1526  LABPROT 16.4*  INR 1.30   ABG    Component Value Date/Time   PHART 7.368 07/23/2011 2303   HCO3 26.4* 07/23/2011 2303   TCO2 26 07/24/2011 1823   O2SAT 90.0 07/23/2011 2303   CBG (last 3)   Basename 07/26/11 0746 07/26/11 0343 07/26/11 0004  GLUCAP 158* 137* 174*    Assessment/Plan: S/P Procedure(s) (LRB): CORONARY ARTERY BYPASS GRAFTING (CABG) (N/A) AORTIC VALVE REPLACEMENT (AVR) (N/A) Mobilize Diuresis Diabetes control Plan for transfer to step-down: see transfer orders DM control better    LOS: 10 days    Luana Tatro C 07/26/2011

## 2011-07-26 NOTE — Progress Notes (Addendum)
Pt started c/o a terrible headache. Stated its the worst headache of his life, Neuro assessed as pt claimed he was suspecting of weakness and family said he has slurred speech. Neuro intact accept for the headache. 100mg  tramadol given,5mg  Oxy immediate release also given with no relieve in headache. Dr. Dorris Fetch paged. MD will be coming to assess the pt.

## 2011-07-26 NOTE — Plan of Care (Signed)
Problem: Phase III Progression Outcomes Goal: Time patient transferred to PCTU/Telemetry POD Outcome: Completed/Met Date Met:  07/26/11 1515pm

## 2011-07-26 NOTE — Progress Notes (Signed)
Patient ID: Sean [name], male   DOB: 12-Aug-1946, 65 y.o.   MRN: [id number]  C/o severe headache Posterior, associated with neck muscle spasms, equal bilaterally Relieved temporarily with neck massage, which reproduces pain, but "hurts good" Neuro A&O x 3, PERRLA, EOMI, no focal deficit  C/w neck spasm, muscle tension headache  Flexeril/ Toradol PRN

## 2011-07-27 LAB — CBC
MCHC: 33.3 g/dL (ref 30.0–36.0)
Platelets: 150 10*3/uL (ref 150–400)
RDW: 14.8 % (ref 11.5–15.5)

## 2011-07-27 LAB — GLUCOSE, CAPILLARY: Glucose-Capillary: 133 mg/dL — ABNORMAL HIGH (ref 70–99)

## 2011-07-27 LAB — BASIC METABOLIC PANEL
GFR calc Af Amer: >90 mL/min (ref >90)
GFR calc non Af Amer: 84 mL/min — ABNORMAL LOW (ref >90)
Potassium: 3.8 mEq/L (ref 3.5–5.1)
Sodium: 134 mEq/L — ABNORMAL LOW (ref 135–145)

## 2011-07-27 MED ORDER — POLYSACCHARIDE IRON 150 MG PO CAPS
150.0000 mg | ORAL_CAPSULE | Freq: Every day | ORAL | Status: DC
  Administered 2011-07-27 – 2011-07-31 (×5): 150 mg via ORAL
  Filled 2011-07-27 (×6): qty 1

## 2011-07-27 NOTE — Progress Notes (Signed)
Hgb =7.8 down from 8.1. Dr Dorris Fetch aware. Will continue to monitor.

## 2011-07-27 NOTE — Progress Notes (Signed)
4 Days Post-Op Procedure(s) (LRB): CORONARY ARTERY BYPASS GRAFTING (CABG) (N/A) AORTIC VALVE REPLACEMENT (AVR) (N/A) Subjective: Feels much better today. Slight HA earlier, better now, neck pain much improved  Objective: Vital signs in last 24 hours: Temp:  [97.4 F (36.3 C)-98.6 F (37 C)] 98.6 F (37 C) (12/08 0529) Pulse Rate:  [75-99] 86  (12/08 0529) Cardiac Rhythm:  [-] Normal sinus rhythm;Heart block (12/08 0848) Resp:  [17-31] 24  (12/08 0529) BP: (98-136)/(55-76) 104/59 mmHg (12/08 0529) SpO2:  [84 %-97 %] 84 % (12/08 0837) Weight:  [98.5 kg (217 lb 2.5 oz)] 217 lb 2.5 oz (98.5 kg) (12/08 0500)  Hemodynamic parameters for last 24 hours:    Intake/Output from previous day: 12/07 0701 - 12/08 0700 In: 496 [P.O.:490; I.V.:6] Out: 660 [Urine:660] Intake/Output this shift: Total I/O In: 3 [I.V.:3] Out: -   General appearance: alert and no distress Neurologic: intact Heart: regular rate and rhythm Lungs: diminished breath sounds bibasilar Wound: intact  Lab Results:  Basename 07/27/11 0657 07/26/11 0400  WBC 9.5 11.6*  HGB 7.8* 8.1*  HCT 23.4* 24.3*  PLT 150 108*   BMET:  Basename 07/27/11 0657 07/26/11 0400  NA 134* 135  K 3.8 4.1  CL 97 98  CO2 29 29  GLUCOSE 176* 148*  BUN 26* 15  CREATININE 0.97 0.81  CALCIUM 8.5 8.3*    PT/INR: No results found for this basename: LABPROT,INR in the last 72 hours ABG    Component Value Date/Time   PHART 7.368 07/23/2011 2303   HCO3 26.4* 07/23/2011 2303   TCO2 26 07/24/2011 1823   O2SAT 90.0 07/23/2011 2303   CBG (last 3)   Basename 07/27/11 0547 07/26/11 2104 07/26/11 1606  GLUCAP 119* 131* 94    Assessment/Plan: S/P Procedure(s) (LRB): CORONARY ARTERY BYPASS GRAFTING (CABG) (N/A) AORTIC VALVE REPLACEMENT (AVR) (N/A) Mobilize Diuresis Diabetes control 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

## 2011-07-27 NOTE — Progress Notes (Signed)
CARDIAC REHAB PHASE I   PRE:  Rate/Rhythm: 81  BP:  Supine:   Sitting: 114/58  Standing:    SaO2: 88 - 90% RA  MODE:  Ambulation: 450 ft   POST:  Rate/Rhythem: 78  BP:  Supine:   Sitting: 120/60  Standing:    SaO2: 86 - 90% 3:10 - 3:40 Pt ambulated with RW and 1 assist steady. 02 SAT 88 - 90% walking 02 SAT remained tolerated well. No sob or cp. Back to chair 02 SAT 86-90% feet up call bell in reach. Enc use of IS  Rosalie Doctor

## 2011-07-28 ENCOUNTER — Inpatient Hospital Stay (HOSPITAL_COMMUNITY): Payer: Medicare Other

## 2011-07-28 LAB — GLUCOSE, CAPILLARY
Glucose-Capillary: 91 mg/dL (ref 70–99)
Glucose-Capillary: 152 mg/dL — ABNORMAL HIGH (ref 70–99)

## 2011-07-28 NOTE — Progress Notes (Signed)
Patient ambulated with 3L of oxygen approximately 370ft  with RN using a walker with a very slow, steady gait. Patient tolerated the ambulation well and returned to bed resting. Will continue to monitor.

## 2011-07-28 NOTE — Progress Notes (Addendum)
5 Days Post-Op Procedure(s) (LRB): CORONARY ARTERY BYPASS GRAFTING (CABG) (N/A) AORTIC VALVE REPLACEMENT (AVR) (N/A)  Subjective: C/O occipital HA with neck pain. Denies visual changes or dizziness.  Breathing ok, but still requiring 3 L O2, and desats with activity.  Objective: Vital signs in last 24 hours: Patient Vitals for the past 24 hrs:  BP Temp Temp src Pulse Resp SpO2 Weight  07/28/11 0858 - - - - - 93 % -  07/28/11 0602 126/73 mmHg 98.9 F (37.2 C) Oral 95  20  91 % -  07/28/11 0329 - - - - - - 98.294 kg (216 lb 11.2 oz)  07/27/11 2151 133/77 mmHg 98.7 F (37.1 C) Oral 84  20  92 % -  07/27/11 2127 - - - - - 80 % -  07/27/11 1504 109/64 mmHg 97.6 F (36.4 C) Oral 90  23  93 % -   Current Weight  07/28/11 98.294 kg (216 lb 11.2 oz)     Intake/Output from previous day: 12/08 0701 - 12/09 0700 In: 484 [P.O.:481; I.V.:3] Out: 550 [Urine:550]    PHYSICAL EXAM:  Heart: RRR Lungs: distant BS, no wheezes Wound: clean and dry Extremities: mild bilat LE edema  Lab Results: CBC: Basename 07/27/11 0657 07/26/11 0400  WBC 9.5 11.6*  HGB 7.8* 8.1*  HCT 23.4* 24.3*  PLT 150 108*   BMET:  Basename 07/27/11 0657 07/26/11 0400  NA 134* 135  K 3.8 4.1  CL 97 98  CO2 29 29  GLUCOSE 176* 148*  BUN 26* 15  CREATININE 0.97 0.81  CALCIUM 8.5 8.3*    PT/INR: No results found for this basename: LABPROT,INR in the last 72 hours   Assessment/Plan: S/P Procedure(s) (LRB): CORONARY ARTERY BYPASS GRAFTING (CABG) (N/A) AORTIC VALVE REPLACEMENT (AVR) (N/A) Pulm- h/o COPD.  Continue aggressive pulm toilet, wean O2 as tolerated. May need home O2. CV- stable DM- sugars stable, continue Lantus at home dose. ABL anemia- continue Fe and monitor.    LOS: 12 days    COLLINS,GINA H 07/28/2011  Continues to have headaches, wife says he told her it was the worst he'd ever had this AM. Continues to c/o neck stiffness, he has some limitation of ROM, but no meningeal signs.  Incomplete relief with toradol, flexeril, narcotics. Will check head CT given that the headaches just will not relent.

## 2011-07-29 DIAGNOSIS — I2510 Atherosclerotic heart disease of native coronary artery without angina pectoris: Secondary | ICD-10-CM

## 2011-07-29 LAB — GLUCOSE, CAPILLARY
Glucose-Capillary: 177 mg/dL — ABNORMAL HIGH (ref 70–99)
Glucose-Capillary: 263 mg/dL — ABNORMAL HIGH (ref 70–99)

## 2011-07-29 MED ORDER — ASPIRIN EC 325 MG PO TBEC
325.0000 mg | DELAYED_RELEASE_TABLET | Freq: Every day | ORAL | Status: DC
  Administered 2011-07-29 – 2011-07-31 (×3): 325 mg via ORAL
  Filled 2011-07-29 (×3): qty 1

## 2011-07-29 NOTE — Progress Notes (Signed)
Patient is on room air and O2 saturation is 94. Will continue to monitor

## 2011-07-29 NOTE — Progress Notes (Signed)
Checked Oxygen saturation levels on room air at rest.  O2 sat 81%.  Pt will likely need home O2.  Will follow up with MD.  O2 reapplied @2  liters per nasal cannula. Notified pt's nurse of low oxgen sat.   Jerrell Belfast, RN, BSN Phone #[phone]

## 2011-07-29 NOTE — Progress Notes (Signed)
Pacing wires pulled. 3 wires intact. Pt. Tolerated well. No arrhythmias. Call bell within reach. Bedrest until 1430.  Dion Saucier

## 2011-07-29 NOTE — Progress Notes (Signed)
   CARDIOTHORACIC SURGERY PROGRESS NOTE  6 Days Post-Op  S/P Procedure(s) (LRB): CORONARY ARTERY BYPASS GRAFTING (CABG) (N/A) AORTIC VALVE REPLACEMENT (AVR) (N/A)  Subjective: Feels well. Headaches improved. Otherwise reports feeling well.  Objective: Vital signs in last 24 hours: Temp:  [97.8 F (36.6 C)-98.3 F (36.8 C)] 97.8 F (36.6 C) (12/10 0655) Pulse Rate:  [75-94] 94  (12/10 0655) Cardiac Rhythm:  [-] Heart block (12/09 2030) Resp:  [18-20] 18  (12/10 0655) BP: (128-133)/(58-80) 133/80 mmHg (12/10 0655) SpO2:  [85 %-94 %] 85 % (12/10 0750) Weight:  [96.798 kg (213 lb 6.4 oz)] 213 lb 6.4 oz (96.798 kg) (12/10 0654)  Physical Exam:  Rhythm:   sinus  Breath sounds: Distant, clear  Heart sounds:  rrr  Incisions:  Clean and dry  Abdomen:  soft  Extremities:  warm   Intake/Output from previous day: 12/09 0701 - 12/10 0700 In: 480 [P.O.:480] Out: -  Intake/Output this shift:    Lab Results:  [facility] 07/27/11 0657  WBC 9.5  HGB 7.8*  HCT 23.4*  PLT 150   BMET:  Basename 07/27/11 0657  NA 134*  K 3.8  CL 97  CO2 29  GLUCOSE 176*  BUN 26*  CREATININE 0.97  CALCIUM 8.5    CBG (last 3)   Basename 07/29/11 0637 07/28/11 2147 07/28/11 1620  GLUCAP 181* 177* 152*   PT/INR:  No results found for this basename: LABPROT,INR in the last 72 hours  CXR:  N/A  Assessment/Plan: S/P Procedure(s) (LRB): CORONARY ARTERY BYPASS GRAFTING (CABG) (N/A) AORTIC VALVE REPLACEMENT (AVR) (N/A)  Progressing well POD #6 Headaches improved, seem to be related to neck pain and muscle tension.  CT scan negative. Still O2 dependent but severe underlying COPD and longstanding tobacco abuse. Reasonably good glycemic control Possible d/c home 2-3 days with home health nursing and home O2  [name],[name] 07/29/2011

## 2011-07-29 NOTE — Progress Notes (Signed)
   CARE MANAGEMENT NOTE 07/29/2011  Patient:  Sean [name], Sean [name]   Account Number:  [id number]  Date Initiated:  07/17/2011  Documentation initiated by:  Sean [name]  Subjective/Objective Assessment:   65 yr old male admitted for A-fib.     Action/Plan:   Discharge planning. Spoke with patient and his wife.Patient needs a heart cath.   Anticipated DC Date:  07/29/2011   Anticipated DC Plan:  HOME W HOME HEALTH SERVICES      DC Planning Services  CM consult  VA referrals / transfers      Choice offered to / List presented to:             Status of service:  In process, will continue to follow Medicare Important Message given?   (If response is "NO", the following Medicare IM given date fields will be blank) Date Medicare IM given:   Date Additional Medicare IM given:    Discharge Disposition:    Per UR Regulation:  Reviewed for med. necessity/level of care/duration of stay  Comments:  07/29/11 Sean Selman,RN,BSN 1415 PTA, PT INDEPENDENT, LIVES WITH SPOUSE.  HE HAS A WALKER TO USE AT HOME, IF NEEDED. PT MAY LIKELY NEED HOME O2, AS RESTING O2 SATS 81% TODAY.  WILL FOLLOW UP WITH MD IN AM. PT DENIES ANY ADDITIONAL NEEDS. Phone #[phone]  07-24-11 12noon Sean [name], RNBSN - [phone] UR Completed.  - now post op CABG x 3 and AVR.  07-22-11 [address], [place] [phone] CM spoke to [place], [place] and she faxed information to [place] on 06-21-11. [facility]. CABG planned for 06-23-11. CM will continue to f/u for additional needs.  07/21/2011 1100 Spoke to pt and wife. Pt has made the decision not to transfer to [place] and wants to continue his care at [facility] [place] they have received confirmation from [place] that they are covering his stay at [facility]. Their VA contact is Sean [name] [phone]. Pt feels confident in the care of his physicians performing his surgery at [facility] facility and wanted to continue under physicians post-op. VA  preference to transfer from non VA facility to a [place] facility form, pt has declined to sign at this time.  Sean Donning RN CCM Case Mgmt phone [phone]  07/18/11 0930 Sean Peper, RN BSN Case Manager [phone] Received packet of info from Sean [name] -[place]. Bed availablitity has not been determined at [facility]. Sean [name] have expressed major conerns regarding having a huge hospital bill and inability to pay. I have relayed these concerns to [name], and the patient spoke with him also. Patient and wife are refusing to sign transfer or waiver to transfer form. Patient will stay at [facility]. I spoke with Sean [name] and explained to him the process for VA transfer and where we are.Patient will have cath and possible PCI today.   07/17/11 12:30 pm Sean Peper, RN BSN Case Manager Called the VA to inform them of Sean [name]'s admission.Information has been forwarded to a transfer coordinator, Case manager will forward them requested information and should receive a transfer packet. Patient has right to refuse transfer to [place], will speak further with he and his wife.

## 2011-07-29 NOTE — Progress Notes (Signed)
Subjective:    Sean [name] is a 65 yo gentleman usually seen at the [place].   and AICD discharges. He was found to have significant three-vessel coronary artery disease and aortic stenosis and eventually had coronary artery bypass grafting as well as aortic valve replacement.  He is done very well from a cardiac standpoint. He's not had any episodes of chest pain or shortness of breath. He's recovering quite well and is expected to be discharged in the next day or so.  He is up ambulating without any difficulty.     Marland Kitchen atenolol  25 mg Oral Daily  . budesonide-formoterol  2 puff Inhalation BID  . enoxaparin  40 mg Subcutaneous QHS  . fluticasone  2 spray Each Nare Daily  . furosemide  40 mg Oral Daily  . gabapentin  100 mg Oral TID  . insulin aspart  0-24 Units Subcutaneous TID AC & HS  . insulin glargine  56 Units Subcutaneous Daily  . polysaccharide iron  150 mg Oral Daily  . potassium chloride  20 mEq Oral BID  . simvastatin  20 mg Oral QHS  . sodium chloride  3 mL Intravenous Q12H      Objective:  Vital Signs in the last 24 hours: Blood pressure 133/80, pulse 94, temperature 97.8 F (36.6 C), temperature source Oral, resp. rate 18, height 6' (1.829 m), weight 213 lb 6.4 oz (96.798 kg), SpO2 85.00%. Temp:  [97.8 F (36.6 C)-98.3 F (36.8 C)] 97.8 F (36.6 C) (12/10 0655) Pulse Rate:  [75-94] 94  (12/10 0655) Resp:  [18-20] 18  (12/10 0655) BP: (128-133)/(58-80) 133/80 mmHg (12/10 0655) SpO2:  [85 %-94 %] 85 % (12/10 0750) Weight:  [213 lb 6.4 oz (96.798 kg)] 213 lb 6.4 oz (96.798 kg) (12/10 0654)  Intake/Output from previous day: 12/09 0701 - 12/10 0700 In: 480 [P.O.:480] Out: -  Intake/Output from this shift:    Physical Exam: The patient is alert and oriented x 3.  The mood and affect are normal.   Skin: warm and dry.  Color is normal.    HEENT:   the sclera are nonicteric.  The mucous membranes are moist.  The carotids are 2+ without bruits.  There is no  thyromegaly.  There is no JVD.    Lungs: clear.  The chest wall is mildly tender.  His sternotomy is healing nicely  Heart: regular rate with a normal S1 and S2.  There is a soft systolic murmur.  The PMI is not displaced.     Abdomen: good bowel sounds.  There is no guarding or rebound.  There is no hepatosplenomegaly or tenderness.  There are no masses.   Extremities:  no clubbing, cyanosis, or edema.  The legs are without rashes.  The distal pulses are intact.   Neuro:  Cranial nerves II - XII are intact.  Motor and sensory functions are intact.     Lab Results:  [facility] 07/27/11 0657  WBC 9.5  HGB 7.8*  PLT 150    Basename 07/27/11 0657  NA 134*  K 3.8  CL 97  CO2 29  GLUCOSE 176*  BUN 26*  CREATININE 0.97   Telemetry: Normal sinus rhythm  Assessment/Plan:   1.  CAD (coronary artery disease):  The patient is doing well following his coronary artery bypass grafting. He's currently on atenolol.  Will restart his aspirin.  2.  Congestive heart failure:  His ejection fraction is around 50%.    3.  Diabetes mellitus:  He has long-standing diabetes. He will return to his general medical Dr.    4.  Ventricular fibrillation: He has a history of ventricular tachycardia/ventricular fibrillation that was likely due to ischemia. He's not had any recurrent arrhythmias following his bypass grafting.  He has an AICD placed. He'll be seen in device clinic.    5.  aortic stenosis: S/P AVR (aortic valve replacement) (07/25/2011)  he's doing very well following his aortic valve surgery.  6. Anemia: His blood counts at been slowly falling since his surgery. CBC is pending as of this morning but is not back yet.   Vesta Mixer, Montez Hageman., MD, [facility] 07/29/2011, 9:03 AM LOS: Day 13

## 2011-07-29 NOTE — Progress Notes (Addendum)
CARDIAC REHAB PHASE I   PRE:  Rate/Rhythm: 102  BP:  Supine:   Sitting: 118/62  Standing:    SaO2: 91% 2L  MODE:  Ambulation:  550 ft   POST:  Rate/Rhythem: 112  BP:  Supine:   Sitting: 120/64  Standing:    SaO2: 88% - 90%  Pt ambulated with RW and 1 assist steady. Tolerates well. 02 sats 88% while walking on 02 returns to 90% after resting 1-3 minutes. Back to chair with feet up and call be in reach.  Rosalie Doctor

## 2011-07-30 ENCOUNTER — Inpatient Hospital Stay (HOSPITAL_COMMUNITY): Payer: Medicare Other

## 2011-07-30 DIAGNOSIS — I4891 Unspecified atrial fibrillation: Secondary | ICD-10-CM

## 2011-07-30 LAB — CBC
HCT: 28.9 % — ABNORMAL LOW (ref 39.0–52.0)
Platelets: 265 10*3/uL (ref 150–400)
RDW: 15.4 % (ref 11.5–15.5)
WBC: 10.8 10*3/uL — ABNORMAL HIGH (ref 4.0–10.5)

## 2011-07-30 LAB — BASIC METABOLIC PANEL
BUN: 20 mg/dL (ref 6–23)
Chloride: 99 mEq/L (ref 96–112)
GFR calc Af Amer: >90 mL/min (ref >90)
Potassium: 4.9 mEq/L (ref 3.5–5.1)

## 2011-07-30 LAB — GLUCOSE, CAPILLARY
Glucose-Capillary: 264 mg/dL — ABNORMAL HIGH (ref 70–99)
Glucose-Capillary: 292 mg/dL — ABNORMAL HIGH (ref 70–99)
Glucose-Capillary: 296 mg/dL — ABNORMAL HIGH (ref 70–99)

## 2011-07-30 MED ORDER — INSULIN GLARGINE 100 UNIT/ML ~~LOC~~ SOLN
65.0000 [IU] | Freq: Every day | SUBCUTANEOUS | Status: DC
  Administered 2011-07-31: 65 [IU] via SUBCUTANEOUS
  Filled 2011-07-30: qty 3

## 2011-07-30 MED ORDER — FUROSEMIDE 40 MG PO TABS: 40.0000 mg | ORAL_TABLET | Freq: Every day | ORAL | Status: DC

## 2011-07-30 MED ORDER — CYCLOBENZAPRINE HCL 10 MG PO TABS
10.0000 mg | ORAL_TABLET | Freq: Three times a day (TID) | ORAL | Status: DC
  Administered 2011-07-30 – 2011-07-31 (×3): 10 mg via ORAL
  Filled 2011-07-30 (×5): qty 1

## 2011-07-30 MED ORDER — OXYCODONE HCL 5 MG PO TABS: 5.0000 mg | ORAL_TABLET | Freq: Four times a day (QID) | ORAL | Status: AC | PRN

## 2011-07-30 MED ORDER — ASPIRIN 325 MG PO TBEC: 325.0000 mg | DELAYED_RELEASE_TABLET | Freq: Every day | ORAL | Status: AC

## 2011-07-30 MED ORDER — AMIODARONE HCL 400 MG PO TABS: 400.0000 mg | ORAL_TABLET | Freq: Two times a day (BID) | ORAL | Status: DC

## 2011-07-30 MED ORDER — AMIODARONE HCL 200 MG PO TABS
400.0000 mg | ORAL_TABLET | Freq: Two times a day (BID) | ORAL | Status: DC
  Administered 2011-07-30 – 2011-07-31 (×3): 400 mg via ORAL
  Filled 2011-07-30 (×7): qty 2

## 2011-07-30 MED ORDER — ATENOLOL 25 MG PO TABS: 25.0000 mg | ORAL_TABLET | Freq: Every day | ORAL | Status: DC

## 2011-07-30 MED ORDER — POLYSACCHARIDE IRON 150 MG PO CAPS: 150.0000 mg | ORAL_CAPSULE | Freq: Every day | ORAL | Status: DC

## 2011-07-30 MED ORDER — POTASSIUM CHLORIDE CRYS ER 20 MEQ PO TBCR: 20.0000 meq | EXTENDED_RELEASE_TABLET | Freq: Every day | ORAL | Status: DC

## 2011-07-30 MED ORDER — INSULIN GLARGINE 100 UNIT/ML ~~LOC~~ SOLN: 65.0000 [IU] | Freq: Every day | SUBCUTANEOUS | Status: DC

## 2011-07-30 MED FILL — Sodium Chloride Irrigation Soln 0.9%: Qty: 3000 | Status: AC

## 2011-07-30 MED FILL — Electrolyte-R (PH 7.4) Solution: INTRAVENOUS | Qty: 7000 | Status: AC

## 2011-07-30 MED FILL — Sodium Chloride IV Soln 0.9%: INTRAVENOUS | Qty: 1000 | Status: AC

## 2011-07-30 MED FILL — Heparin Sodium (Porcine) Inj 1000 Unit/ML: INTRAMUSCULAR | Qty: 90 | Status: AC

## 2011-07-30 NOTE — Progress Notes (Signed)
Wife concerned with husbands headaches.  She says he gets medicine and feels sleepy and gets better but they come back.  She spoke with her son in [place] and for some reason feels he has a staph infection.  I explained that he could be suffering from caffeine withdrawal and that he was getting relief from the medicine.  I stopped giving oxycodone and started ultram due to sweating episodes.  Pt feels med is working.  Tramadol only given once today.  Will continue to monitor.  Pt on phone at this time will re-evaluate pain level. Thomas Hoff

## 2011-07-30 NOTE — Progress Notes (Signed)
Please see telemetry, patient in atrial fib sustained rate controlled, patient sleeping.

## 2011-07-30 NOTE — Discharge Summary (Addendum)
Sean [name] 1945-12-12 65 y.o. [id number]  07/16/2011   Purcell Nails, MD  Ventricular fibrillation [427.41] Presence of automatic cardioverter/defibrillator (AICD) [V45.02] SYNCOPE a fib chest pain CAD aortic stenosis  HPI: Patient is a 65 year old obese white male who is a service connected disabled [place] veteran who typically gets his care through the [place] [facility] both at the [facility] and [facility]. The patient has known history of coronary artery disease with previous myocardial infarction more than 15 years ago. He he apparently had a second heart attack several years later and underwent PCI and stenting at that time. Records of his previous coronary interventions are not available. The patient also has history of an implantable defibrillator. This was placed after he sustained a cardiac arrest following a prep for upper GI endoscopy. His defibrillator is followed regularly at the [facility].  The patient reports that just over one month ago he had an episode where he developed discomfort in his chest radiating to his neck and down his arm associated with shortness of breath while he was out hunting. His defibrillator fired. Since then he has not felt well and he admits to worsened exertional shortness of breath and decreased energy intermittent episodes of exertional chest discomfort. 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 He again developed typical discomfort on the left side of his neck and chest associated with shortness of breath. He became dizzy and his defibrillator fired. He blacked out but will rapidly came back consciousness. His wife was present and summoned EMS. The patient was brought to [facility] for further management.  Upon arrival in the emergency department interrogation of the patient's defibrillator revealed that the patient had an appropriate shock for ventricular fibrillation. He was admitted to the hospital and  subsequently ruled out for acute myocardial infarction.  The patient underwent elective cardiac catheterization earlier today by Dr. Excell Seltzer. He was found to have severe three-vessel coronary artery disease with mild left ventricular dysfunction. There is some question about the possibility of mild aortic stenosis. Cardiothoracic surgical consultation was requested.  Past Medical History   Diagnosis  Date   .  Myocardial infarction    .  Ventricular tachycardia    .  Diabetes mellitus    .  COPD (chronic obstructive pulmonary disease)     Past Surgical History   Procedure  Date   .  Pacemaker insertion    .  Nose surgery      skin cancer   .  Cardiac catheterization    .  Coronary angioplasty     No family history on file.  Social History  History   Substance Use Topics   .  Smoking status:  Current Everyday Smoker -- 1.0 packs/day   .  Smokeless tobacco:  Not on file   .  Alcohol Use:  No    Current Facility-Administered Medications   Medication  Dose  Route  Frequency  Provider  Last Rate  Last Dose   .  0.9 % sodium chloride infusion  250 mL  Intravenous  PRN  Darrol Jump, PA   250 mL at 07/17/11 1500   .  acetaminophen (TYLENOL) tablet 650 mg  650 mg  Oral  Q4H PRN  Micheline Chapman, MD     .  adenosine (ADENOCARD) 12 MG/4ML injection 48 mg  16 mL  Intravenous  Once  Micheline Chapman, MD     .  albuterol (PROVENTIL HFA;VENTOLIN HFA) 108 (90 BASE) MCG/ACT inhaler 2 puff  2 puff  Inhalation  Q6H PRN  Darrol Jump, PA     .  ALPRAZolam Prudy Feeler) tablet 0.25 mg  0.25 mg  Oral  BID PRN  Darrol Jump, PA     .  ammonium lactate (LAC-HYDRIN) 12 % lotion   Topical  Daily PRN  Christella Hartigan, PHARMD     .  aspirin EC tablet 81 mg  81 mg  Oral  Daily  Hilario Quarry Amend, PHARMD   81 mg at 07/18/11 0957   .  aspirin EC tablet 81 mg  81 mg  Oral  Daily  Hilario Quarry Amend, [place]     .  atenolol (TENORMIN) tablet 100 mg  100 mg  Oral  Daily  Darrol Jump, PA   100 mg at  07/18/11 0959   .  bivalirudin (ANGIOMAX) 250 MG injection         .  budesonide-formoterol (SYMBICORT) 160-4.5 MCG/ACT inhaler 2 puff  2 puff  Inhalation  BID  Darrol Jump, PA   2 puff at 07/18/11 0832   .  cholecalciferol (D-VI-SOL) 400 UNIT/ML oral liquid 400 Units  400 Units  Oral  Daily  Darrol Jump, PA   400 Units at 07/17/11 1218   .  diazepam (VALIUM) tablet 5 mg  5 mg  Oral  On Call  Elyn Aquas., MD   5 mg at 07/18/11 1055   .  fentaNYL (SUBLIMAZE) 0.05 MG/ML injection         .  fluticasone (FLONASE) 50 MCG/ACT nasal spray 2 spray  2 spray  Each Nare  Daily  Elyn Aquas., MD   2 spray at 07/18/11 [phone]   .  gabapentin (NEURONTIN) capsule 100 mg  100 mg  Oral  TID  Darrol Jump, PA   100 mg at 07/18/11 1000   .  heparin 1000 UNIT/ML injection         .  heparin 2-0.9 UNIT/ML-% infusion         .  heparin ADULT infusion 100 units/ml (25000 units/250 ml)  1,800 Units/hr  Intravenous  Continuous  Laurena Bering, PHARMD   1,800 Units at 07/18/11 0800   .  HYDROcodone-acetaminophen (NORCO) 10-325 MG per tablet 1 tablet  1 tablet  Oral  Q6H PRN  Darrol Jump, PA     .  insulin aspart (novoLOG) injection 0-15 Units  0-15 Units  Subcutaneous  TID WC  Elyn Aquas., MD   5 Units at 07/18/11 [phone]   .  insulin aspart (novoLOG) injection 0-5 Units  0-5 Units  Subcutaneous  QHS  Elyn Aquas., MD     .  insulin aspart (novoLOG) injection 12 Units  12 Units  Subcutaneous  TID AC  Darrol Jump, PA   12 Units at 07/17/11 1750   .  insulin glargine (LANTUS) injection 56 Units  56 Units  Subcutaneous  QHS  Darrol Jump, PA   56 Units at 07/17/11 2134   .  lidocaine (XYLOCAINE) 1 % injection         .  midazolam (VERSED) 2 MG/2ML injection         .  nicotine (NICODERM CQ - dosed in mg/24 hours) patch 14 mg  14 mg  Transdermal  Daily  Dayna N Dunn, PA   14 mg at 07/17/11 2049   .  nitroGLYCERIN (NITROSTAT) SL tablet 0.4 mg  0.4 mg  Sublingual  Q5 min PRN   Darrol Jump, PA     .  nitroGLYCERIN (NTG ON-CALL) 0.2 mg/mL injection         .  ondansetron (ZOFRAN) injection 4 mg  4 mg  Intravenous  Q6H PRN  Micheline Chapman, MD     .  simvastatin (ZOCOR) tablet 20 mg  20 mg  Oral  QHS  Darrol Jump, PA   20 mg at 07/17/11 2134   .  sodium chloride 0.9 % injection 3 mL  3 mL  Intravenous  Q12H  Darrol Jump, PA   3 mL at 07/18/11 0959   .  sodium chloride 0.9 % injection 3 mL  3 mL  Intravenous  PRN  Darrol Jump, PA     .  verapamil (ISOPTIN) 2.5 MG/ML injection         .  zolpidem (AMBIEN) tablet 5 mg  5 mg  Oral  QHS PRN  Darrol Jump, PA     .  DISCONTD: 0.9 % sodium chloride infusion  250 mL  Intravenous  PRN  Elyn Aquas., MD     .  DISCONTD: 0.9 % sodium chloride infusion   Intravenous  Continuous  Elyn Aquas., MD  100 mL/hr at 07/18/11 0843  100 mL at 07/18/11 0843   .  DISCONTD: acetaminophen (TYLENOL) tablet 650 mg  650 mg  Oral  Q4H PRN  Darrol Jump, PA     .  DISCONTD: aspirin chewable tablet 324 mg  324 mg  Oral  Pre-Cath  Elyn Aquas., MD     .  DISCONTD: ondansetron [facility]) injection 4 mg  4 mg  Intravenous  Q6H PRN  Darrol Jump, PA     .  DISCONTD: sodium chloride 0.9 % injection 3 mL  3 mL  Intravenous  Q12H  Elyn Aquas., MD     .  DISCONTD: sodium chloride 0.9 % injection 3 mL  3 mL  Intravenous  PRN  Elyn Aquas., MD      Allergies   Allergen  Reactions   .  Novocain  Other (See Comments)     unknown   .  Rosuvastatin  Other (See Comments)     Muscle pain    Review of Systems: at time of consult General: normal appetite, decreased energy  Respiratory: persistent cough for the past month or so often productive of greenish sputum, no wheezing, no hemoptysis, no pain with inspiration or cough, exertional shortness of breath  Cardiac: exertional chest pain or tightness and exertional SOB, no resting SOB, no PND, no orthopnea, no LE edema, no palpitations, no syncope    GI: no difficulty swallowing, no hematochezia, no hematemesis, no melena, no constipation, no diarrhea, symptoms of reflux well controlled on antacid therapy  GU: no dysuria, no urgency, no frequency  Musculoskeletal: mild arthritis in hands, no arthralgia  Vascular: no pain suggestive of claudication  Neuro: no symptoms suggestive of TIA's, no seizures, no headaches, + peripheral neuropathy  Endocrine: Poorly controlled type II diabetes with admitted dietary non-compliance  HEENT: no loose teeth or painful teeth, no recent vision changes  Psych: no anxiety, no depression  Physical Exam: at time of consult BP 139/68  Pulse 63  Temp(Src) 97.5 F (36.4 C) (Oral)  Resp 17  Ht 6' (1.829 m)  Wt 95.5 kg (210 lb 8.6 oz)  BMI 28.55 kg/m2  SpO2 97%  General: Obese, ill-appearing  HEENT: Unremarkable  Neck: no JVD, + bruits, no adenopathy  Chest: clear to auscultation, symmetrical breath sounds, no wheezes, no rhonchi  CV: RRR, grade III/VI systolic murmur heard best at sternal border  Abdomen: soft, non-tender, no masses  Extremities: warm, well-perfused, pulses diminished but palpable  Rectal/GU Deferred  Neuro: Grossly non-focal and symmetrical throughout  Skin: Clean and dry, no rashes, no breakdown  Diagnostic Tests: at time of consult 07/18/2011 Cardiac catheterization performed earlier today is reviewed. This demonstrates severe three-vessel coronary artery disease with mild left ventricular dysfunction. There is diffuse coronary artery disease throughout with findings consistent with long-standing poorly controlled diabetes mellitus. There is heavy calcification in the proximal left anterior descending coronary artery with 75% stenosis at the origin the vessel and 75% in the proximal vessel. There is a high obtuse marginal branch of the left circumflex coronary artery which has 70% ostial stenosis. There is right dominant coronary circulation. There is 90% proximal stenosis of the right  coronary artery. There is 75% in-stent restenosis in the distal right coronary artery. There is diffuse disease in the terminal branches of the right coronary artery. Left ventricular ejection fraction is estimated 55%. There is no mention of a gradient across the aortic valve although there is felt to be probable mild aortic stenosis.  A transesophageal echocardiogram was performed on 07/22/2011. These findings were discussed with the patient and it was felt he would be a candidate for both aVR and coronary artery bypass grafting. He agreed with these recommendations and the procedure was scheduled.   Hospital Course: On 07/23/2011 the patient was taken to the operating room were where he underwent the following procedure: Procedure:  Aortic Valve Replacement  25mm Edwards Magna Ease Pericardial Tissue Valve Coronary Artery Bypass Grafting x 3  Left Internal Mammary Artery to Distal Left Anterior Descending Coronary Artery  Saphenous Vein Graft to Posterior Descending Coronary Artery  Saphenous Vein Graft to First Obtuse Marginal Branch of Left Circumflex Coronary Artery  Endoscopic Vein Harvest from Bilateral Thigh The patient tolerated the procedure well was taken to the surgical intensive care unit in stable condition.    Postoperative hospital course: The patient has overall progressed nicely with time. All routine lines,monitors, and drainage devices have been discontinued in the standard fashion. He he did develop postoperative atrial fibrillation, but has been successfully chemically cardioverted to normal sinus rhythm with amiodarone. He has a moderate volume overload but is responding well to diuretics. He currently is not on a Coumadin. During the postoperative period he has had significant difficulties related to headache and neck discomfort. CT scan was unremarkable. His diabetes has been controlled with standard protocols and his home medications have been titrated. He does have  significant COPD  and it appears he will require at least short-term home oxygen therapy. Incisions are healing well without evidence of infection. He is tolerating diet. He is tolerating routine cardiac rehabilitation phase 1 modalities. Currently his status is felt to be tentatively stable for discharge in the next day or so.`  Basename 07/30/11 0520  NA 135  K 4.9  CL 99  CO2 28  GLUCOSE 254*  BUN 20  CALCIUM 9.3    Basename 07/30/11 0520  WBC 10.8*  HGB 9.4*  HCT 28.9*  PLT 265   No results found for this basename: INR:2 in the last 72 hours   Discharge Instructions:  The patient is discharged to home with  extensive instructions on wound care and progressive ambulation.  They are instructed not to drive or perform any heavy lifting until returning to see the physician in his office.  Discharge Diagnosis:  Ventricular fibrillation [427.41] Presence of automatic cardioverter/defibrillator (AICD) [V45.02] SYNCOPE a fib chest pain CAD aortic stenosis  Secondary Diagnosis: Patient Active Problem List  Diagnoses  . CAD (coronary artery disease)  . Congestive heart failure  . Diabetes mellitus  . COPD (chronic obstructive pulmonary disease)  . Ventricular fibrillation  . S/P CABG x 3  . S/P AVR (aortic valve replacement)  . Atrial fibrillation   Past Medical History  Diagnosis Date  . Myocardial infarction   . Ventricular tachycardia   . Diabetes mellitus   . COPD (chronic obstructive pulmonary disease)        Michoel, [place]  Home Medication Instructions ZOX:[id number]   Printed on:07/30/11 1356  Medication Information                    albuterol (PROVENTIL HFA;VENTOLIN HFA) 108 (90 BASE) MCG/ACT inhaler Inhale 2 puffs into the lungs every 6 (six) hours as needed.             ammonium lactate (AMLACTIN) 12 % cream Apply topically as needed.             atorvastatin (LIPITOR) 10 MG tablet Take 10 mg by mouth daily.             budesonide-formoterol  (SYMBICORT) 160-4.5 MCG/ACT inhaler Inhale 2 puffs into the lungs 2 (two) times daily.             cholecalciferol (D-VI-SOL) 400 UNIT/ML LIQD Take 400 Units by mouth daily.             flunisolide (NASALIDE) 0.025 % SOLN Inhale 2 sprays into the lungs 2 (two) times daily.             gabapentin (NEURONTIN) 100 MG capsule Take 100 mg by mouth 3 (three) times daily.             insulin aspart (NOVOLOG) 100 UNIT/ML injection Inject 12 Units into the skin 3 (three) times daily before meals.            amiodarone (PACERONE) 400 MG tablet Take 1 tablet (400 mg total) by mouth 2 (two) times daily.           aspirin EC 325 MG EC tablet Take 1 tablet (325 mg total) by mouth daily.           atenolol (TENORMIN) 25 MG tablet Take 1 tablet (25 mg total) by mouth daily.           furosemide (LASIX) 40 MG tablet Take 1 tablet (40 mg total) by mouth daily.           insulin glargine (LANTUS) 100 UNIT/ML injection Inject 65 Units into the skin at bedtime.           oxyCODONE (OXY IR/ROXICODONE) 5 MG immediate release tablet Take 1-2 tablets (5-10 mg total) by mouth every 6 (six) hours as needed.           polysaccharide iron (NIFEREX) 150 MG CAPS capsule Take 1 capsule (150 mg total) by mouth daily.           potassium chloride SA (K-DUR,KLOR-CON) 20 MEQ tablet Take 1 tablet (20 mEq total) by mouth daily.             Disposition: Home in stable condition  Addendum Patient to be discharged on amiodorone 200 mg daily per Dr Elease Hashimoto recommendations  Gershon Crane, PA-C 07/30/2011  1:56 PM

## 2011-07-30 NOTE — Progress Notes (Signed)
CARDIAC REHAB PHASE I   PRE:  Rate/Rhythm: 101ST  BP:  Supine:   Sitting: 118/68  Standing:    SaO2: 90%3L  MODE:  Ambulation: 740 ft   POST:  Rate/Rhythem: 113  BP:  Supine:   Sitting: 122/70  Standing:    SaO2: 90%3L 1126-1200 Taking 3L to keep O2 sats at 90%. Ambulated 740 with rolling walker and asst x1. Tolerated well. Encouraged I.S. To recliner after walk.  Duanne Limerick

## 2011-07-30 NOTE — Progress Notes (Addendum)
7 Days Post-Op  Procedure(s) (LRB): CORONARY ARTERY BYPASS GRAFTING (CABG) (N/A) AORTIC VALVE REPLACEMENT (AVR) (N/A) Subjective: Only c/o of headeache  Objective  Telemetry NSR, PVC's   Temp:  [97.9 F (36.6 C)-100 F (37.8 C)] 100 F (37.8 C) (12/11 0628) Pulse Rate:  [87-102] 102  (12/11 0628) Resp:  [18-20] 20  (12/11 0628) BP: (112-119)/(65-73) 119/72 mmHg (12/11 0628) SpO2:  [88 %-97 %] 93 % (12/11 0628) Weight:  [205 lb 9.6 oz (93.26 kg)] 205 lb 9.6 oz (93.26 kg) (12/11 5409)   Intake/Output Summary (Last 24 hours) at 07/30/11 1326 Last data filed at 07/30/11 0745  Gross per 24 hour  Intake    720 ml  Output      0 ml  Net    720 ml       General appearance: alert, cooperative and no distress Heart: regular rate and rhythm and S1, S2 normal Lungs: diminished in the bases Abdomen: soft, non-tender; bowel sounds normal; no masses,  no organomegaly Extremities: extremities normal, atraumatic, no cyanosis or edema Wound: incisions healing without signs of infection  Lab Results:  [facility] 07/30/11 0520  NA 135  K 4.9  CL 99  CO2 28  GLUCOSE 254*  BUN 20  CREATININE 0.97  CALCIUM 9.3  MG --  PHOS --   No results found for this basename: AST:2,ALT:2,ALKPHOS:2,BILITOT:2,PROT:2,ALBUMIN:2 in the last 72 hours No results found for this basename: LIPASE:2,AMYLASE:2 in the last 72 hours  Basename 07/30/11 0520  WBC 10.8*  NEUTROABS --  HGB 9.4*  HCT 28.9*  MCV 91.5  PLT 265   No results found for this basename: CKTOTAL:4,CKMB:4,TROPONINI:4 in the last 72 hours No components found with this basename: POCBNP:3 No results found for this basename: DDIMER in the last 72 hours No results found for this basename: HGBA1C in the last 72 hours No results found for this basename: CHOL,HDL,LDLCALC,TRIG,CHOLHDL in the last 72 hours No results found for this basename: TSH,T4TOTAL,FREET3,T3FREE,THYROIDAB in the last 72 hours No results found for this basename:  VITAMINB12,FOLATE,FERRITIN,TIBC,IRON,RETICCTPCT in the last 72 hours  Medications: Scheduled    . amiodarone  400 mg Oral BID  . aspirin EC  325 mg Oral Daily  . atenolol  25 mg Oral Daily  . budesonide-formoterol  2 puff Inhalation BID  . enoxaparin  40 mg Subcutaneous QHS  . fluticasone  2 spray Each Nare Daily  . furosemide  40 mg Oral Daily  . gabapentin  100 mg Oral TID  . insulin aspart  0-24 Units Subcutaneous TID AC & HS  . insulin glargine  56 Units Subcutaneous Daily  . polysaccharide iron  150 mg Oral Daily  . potassium chloride  20 mEq Oral BID  . sodium chloride  3 mL Intravenous Q12H  . DISCONTD: simvastatin  20 mg Oral QHS     Radiology/Studies:  Dg Chest 2 View  07/30/2011  *RADIOLOGY REPORT*  Clinical Data: Post heart surgery, cough, soreness  CHEST - 2 VIEW  Comparison: 07/26/2011  Findings: Enlargement of cardiac silhouette post CABG and AVR. Left subclavian AICD lead tip projects over the right ventricle. Atherosclerotic calcification aortic arch. Mild pulmonary vascular congestion. Decreased bibasilar atelectasis, though mild left basilar atelectasis and small left pleural effusion persist. No segmental consolidation, acute pulmonary edema or pneumothorax.  IMPRESSION: Improving bibasilar aeration.  Original Report Authenticated By: Lollie Marrow, M.D.    INR: Will add last result for INR, ABG once components are confirmed Will add last 4 CBG results once  components are confirmed  Assessment/Plan: S/P Procedure(s) (LRB): CORONARY ARTERY BYPASS GRAFTING (CABG) (N/A) AORTIC VALVE REPLACEMENT (AVR) (N/A) 1. Conts with steady improvement 2. Will prob need short term home O2 3. cxr improving aeration 4. CBG 254-299 range, increase Lantus 5. Poss home in am  LOS: 14 days    Sean [name],Sean [name] 12/11/20121:26 PM   I have seen and examined the patient and agree with the assessment and plan as outlined.  Marquesa Rath H 07/30/2011 4:48 PM

## 2011-07-30 NOTE — Discharge Summary (Signed)
I agree with the above discharge summary and plan for follow-up.  [name],[name]  

## 2011-07-30 NOTE — Progress Notes (Signed)
Subjective:    Sean [name] is a 65 yo gentleman usually seen at the [place].   and AICD discharges. He was found to have significant three-vessel coronary artery disease and aortic stenosis and eventually had coronary artery bypass grafting as well as aortic valve replacement.  He is done very well from a cardiac standpoint. He's not had any episodes of chest pain or shortness of breath. He's recovering quite well and is expected to be discharged in the next day or so.  He developed A-Fib this AM.  He is asymptomatic.  BP is stable     . aspirin EC  325 mg Oral Daily  . atenolol  25 mg Oral Daily  . budesonide-formoterol  2 puff Inhalation BID  . enoxaparin  40 mg Subcutaneous QHS  . fluticasone  2 spray Each Nare Daily  . furosemide  40 mg Oral Daily  . gabapentin  100 mg Oral TID  . insulin aspart  0-24 Units Subcutaneous TID AC & HS  . insulin glargine  56 Units Subcutaneous Daily  . polysaccharide iron  150 mg Oral Daily  . potassium chloride  20 mEq Oral BID  . simvastatin  20 mg Oral QHS  . sodium chloride  3 mL Intravenous Q12H      Objective:  Vital Signs in the last 24 hours: Blood pressure 119/72, pulse 102, temperature 100 F (37.8 C), temperature source Oral, resp. rate 20, height 6' (1.829 m), weight 205 lb 9.6 oz (93.26 kg), SpO2 93.00%. Temp:  [97.9 F (36.6 C)-100 F (37.8 C)] 100 F (37.8 C) (12/11 0628) Pulse Rate:  [87-102] 102  (12/11 0628) Resp:  [18-20] 20  (12/11 0628) BP: (112-119)/(65-73) 119/72 mmHg (12/11 0628) SpO2:  [85 %-97 %] 93 % (12/11 0628) Weight:  [205 lb 9.6 oz (93.26 kg)] 205 lb 9.6 oz (93.26 kg) (12/11 0981)  Intake/Output from previous day: 12/10 0701 - 12/11 0700 In: 1080 [P.O.:1080] Out: -  Intake/Output from this shift:    Physical Exam: The patient is alert and oriented x 3.  The mood and affect are normal.   Skin: warm and dry.  Color is normal.    HEENT:   the sclera are nonicteric.  The mucous membranes are moist.  The  carotids are 2+ without bruits.  There is no thyromegaly.  There is no JVD.    Lungs: There are bilateral basilar rales.  The chest wall is mildly tender.  His sternotomy is healing nicely  Heart: irregular rate with a normal S1 and S2.  There is a soft systolic murmur.  The PMI is not displaced.     Abdomen: good bowel sounds.  There is no guarding or rebound.  There is no hepatosplenomegaly or tenderness.  There are no masses.   Extremities:  no clubbing, cyanosis, or edema.  The legs are without rashes.  The distal pulses are intact.   Neuro:  Cranial nerves II - XII are intact.  Motor and sensory functions are intact.     Lab Results:  [facility] 07/30/11 0520  WBC 10.8*  HGB 9.4*  PLT 265    Basename 07/30/11 0520  NA 135  K 4.9  CL 99  CO2 28  GLUCOSE 254*  BUN 20  CREATININE 0.97   Telemetry: Normal sinus rhythm  Assessment/Plan:   1.  Atrial fibrillation:  The patient has developed A-Fib.  This is not unusual given his CAD and AVR.  I would support  Amiodarone for several  months as well as coumadin therapy.  Will DC simvastatin and start.  Pt has crestor listed as an allergy.  I would accept Lipitor 40 mg daily.    2.  CAD (coronary artery disease):  The patient is doing well following his coronary artery bypass grafting. He's currently on atenolol.  Will restart his aspirin.  3.  Congestive heart failure:  His ejection fraction is around 50%.    4.  Diabetes mellitus:  He has long-standing diabetes. He will return to his general medical Dr.    5.  Ventricular fibrillation: He has a history of ventricular tachycardia/ventricular fibrillation that was likely due to ischemia. He's not had any recurrent arrhythmias following his bypass grafting.  He has an AICD placed. He'll be seen in device clinic.    6.  aortic stenosis: S/P AVR (aortic valve replacement) (07/25/2011)  he's doing very well following his aortic valve surgery.     Vesta Mixer, Montez Hageman., MD,  [facility] 07/30/2011, 7:15 AM LOS: Day 14

## 2011-07-30 NOTE — Progress Notes (Signed)
Inpatient Diabetes Program Recommendations  AACE/ADA: New Consensus Statement on Inpatient Glycemic Control (2009)  Target Ranges:  Prepandial:   less than 140 mg/dL      Peak postprandial:   less than 180 mg/dL (1-2 hours)      Critically ill patients:  140 - 180 mg/dL   Reason for Visit: CBG's elevated today 264 and 299 mg/dL.  Inpatient Diabetes Program Recommendations Insulin - Meal Coverage: Consider restarting Novolog meal coverage 4 units tid with meals. Outpatient Referral: Note A1C=10.7%.  Would benefit from Outpatient diabetes education.  MD please order if appropriate.  Note:

## 2011-07-30 NOTE — Progress Notes (Signed)
Late entry: Pt ambulated in hallway x 2. Pt tolerated ambulating well with walker and asst x 1. Pt on 3 L to keep O2 sats at 90%.  Dion Saucier

## 2011-07-31 DIAGNOSIS — I4891 Unspecified atrial fibrillation: Secondary | ICD-10-CM

## 2011-07-31 MED ORDER — AMIODARONE HCL 200 MG PO TABS: 200.0000 mg | ORAL_TABLET | Freq: Every day | ORAL | Status: DC

## 2011-07-31 MED ORDER — AMIODARONE HCL 100 MG PO TABS: 100.0000 mg | ORAL_TABLET | Freq: Every day | ORAL | Status: DC

## 2011-07-31 NOTE — Progress Notes (Signed)
CARE MANAGEMENT NOTE 07/31/2011  Patient:  Sean [name], Sean [name]   Account Number:  [id number]  Date Initiated:  07/17/2011  Documentation initiated by:  Vance Peper  Subjective/Objective Assessment:   65 yr old male admitted for A-fib.     Action/Plan:   Discharge planning. Spoke with patient and his wife.Patient needs a heart cath.   Anticipated DC Date:  08/02/2011   Anticipated DC Plan:  HOME W HOME HEALTH SERVICES      DC Planning Services  CM consult  VA referrals / transfers      Choice offered to / List presented to:  C-1 Patient   DME arranged  [facility]      DME agency  [facility]        Status of service:  Completed, signed off Medicare Important Message given?   (If response is "NO", the following Medicare IM given date fields will be blank) Date Medicare IM given:   Date Additional Medicare IM given:    Discharge Disposition:  HOME W HOME HEALTH SERVICES  Per UR Regulation:  Reviewed for med. necessity/level of care/duration of stay  Comments:  07/31/11 Celest Reitz,RN,BSN 1100 PT NEEDS HOME O2 ARRANGED.  [facility], BUT VA STATES IT WILL TAKE POSSIBLY 2 DAYS TO ARRANGE.  PT IS FOR DC TODAY, AND DOES NOT WANT TO WAIT 2 MORE DAYS IN HOSPITAL.  PT ALSO HAS MEDICARE AS A SECOND POLICY. VA STATES WE CAN USE MEDICARE TO BILL FOR HOME O2; THEY WILL CALL PT IN 2-3 WEEKS TO SEE IF HE WILL NEED IT >30 DAYS.  IF SO, THEY WILL ARRANGE FOR PT TO SWITCH TO VA OXYGEN.  PT AGREEABLE WITH THIS PLAN.  [facility] NOTIFIED FOR HOME O2 SET UP.  PORTABLE O2 TANK DELIVERED TO PT'S ROOM PRIOR TO DC HOME. Phone #[phone]  07-30-11 8:55am Avie Arenas, RNBSN - [phone] UR Completed.  Into Afib today - started on po amio and coumadin.  07/29/11 Jermani Eberlein,RN,BSN 1415 PTA, PT INDEPENDENT, LIVES WITH SPOUSE.  HE HAS A WALKER TO USE AT HOME, IF NEEDED. PT MAY LIKELY NEED HOME O2, AS RESTING O2 SATS 81% TODAY.  WILL FOLLOW UP WITH MD IN  AM. PT DENIES ANY ADDITIONAL NEEDS. Phone #[phone]  07-24-11 12noon Avie Arenas, RNBSN - [phone] UR Completed.  - now post op CABG x 3 and AVR.  07-22-11 [address], [place] [phone] CM spoke to [place], [place] and she faxed information to [place] on 06-21-11. [facility]. CABG planned for 06-23-11. CM will continue to f/u for additional needs.  07/21/2011 1100 Spoke to pt and wife. Pt has made the decision not to transfer to [place] and wants to continue his care at [facility]. States they have received confirmation from [place] that they are covering his stay at [facility]. Their VA contact is Sharlyne Cai [phone]. Pt feels confident in the care of his physicians performing his surgery at [facility] facility and wanted to continue under physicians post-op. VA preference to transfer from non VA facility to a [place] facility form, pt has declined to sign at this time.  Isidoro Donning RN CCM Case Mgmt phone [phone]  07/18/11 0930 Vance Peper, RN BSN Case Manager [phone] Received packet of info from [facility] -[place]. Bed availablitity has not been determined at [facility]. Mr. and Mrs. Sellitto have expressed major conerns regarding having a huge hospital bill  and inability to pay. I have relayed these concerns to Mr. Orvan Falconer, and the patient spoke with him also. Patient and wife are refusing to sign transfer or waiver to transfer form. Patient will stay at [facility]. I spoke with Dr. Elease Hashimoto and explained to him the process for VA transfer and where we are.Patient will have cath and possible PCI today.   07/17/11 12:30 pm Vance Peper, RN BSN Case Manager Called the VA to inform them of Mr. Etherington's admission.Information has been forwarded to a transfer coordinator, Case manager will forward them requested information and should receive a transfer packet. Patient has right to refuse transfer to [place], will speak further with he and his wife.

## 2011-07-31 NOTE — Plan of Care (Signed)
Problem: Phase III Progression Outcomes Goal: O2 Sat remains > or equal to 90% on room air Outcome: Not Progressing On 3 liters of nasal cannula

## 2011-07-31 NOTE — Progress Notes (Signed)
Subjective:    Sean [name] is a 65 yo gentleman usually seen at the [place].  He was admitted after AICD shocks. He was found to have significant three-vessel coronary artery disease and aortic stenosis and eventually had coronary artery bypass grafting as well as aortic valve replacement.  He is done very well from a cardiac standpoint. He's not had any episodes of chest pain or shortness of breath. He's recovering quite well and is expected to be discharged in the next day or so.  He developed A-Fib yesterday and has already converted back to NSR.  I started low dose Amiodarone yesterday .  He is asymptomatic.  BP is stable     . amiodarone  400 mg Oral BID  . aspirin EC  325 mg Oral Daily  . atenolol  25 mg Oral Daily  . budesonide-formoterol  2 puff Inhalation BID  . cyclobenzaprine  10 mg Oral TID  . enoxaparin  40 mg Subcutaneous QHS  . fluticasone  2 spray Each Nare Daily  . furosemide  40 mg Oral Daily  . gabapentin  100 mg Oral TID  . insulin aspart  0-24 Units Subcutaneous TID AC & HS  . insulin glargine  65 Units Subcutaneous Daily  . polysaccharide iron  150 mg Oral Daily  . potassium chloride  20 mEq Oral BID  . sodium chloride  3 mL Intravenous Q12H  . DISCONTD: insulin glargine  56 Units Subcutaneous Daily      Objective:  Vital Signs in the last 24 hours: Blood pressure 120/67, pulse 76, temperature 97.8 F (36.6 C), temperature source Oral, resp. rate 20, height 6' (1.829 m), weight 200 lb 12.8 oz (91.082 kg), SpO2 93.00%. Temp:  [97.8 F (36.6 C)-99.2 F (37.3 C)] 97.8 F (36.6 C) (12/12 0622) Pulse Rate:  [76-85] 76  (12/12 0622) Resp:  [20] 20  (12/12 0622) BP: (117-130)/(67-77) 120/67 mmHg (12/12 0622) SpO2:  [90 %-93 %] 93 % (12/12 0622) FiO2 (%):  [3 %] 3 % (12/12 0622) Weight:  [200 lb 12.8 oz (91.082 kg)] 200 lb 12.8 oz (91.082 kg) (12/12 0622)  Intake/Output from previous day: 12/11 0701 - 12/12 0700 In: 720 [P.O.:720] Out: -  Intake/Output from  this shift:    Physical Exam: The patient is alert and oriented x 3.  The mood and affect are normal.   Skin: warm and dry.  Color is normal.    HEENT:   the sclera are nonicteric.  The mucous membranes are moist.  The carotids are 2+ without bruits.  There is no thyromegaly.  There is no JVD.    Lungs: There are bilateral basilar rales.  The chest wall is mildly tender.  His sternotomy is healing nicely  Heart: irregular rate with a normal S1 and S2.  There is a soft systolic murmur.  The PMI is not displaced.     Abdomen: good bowel sounds.  There is no guarding or rebound.  There is no hepatosplenomegaly or tenderness.  There are no masses.   Extremities:  no clubbing, cyanosis, or edema.  The legs are without rashes.  The distal pulses are intact.   Neuro:  Cranial nerves II - XII are intact.  Motor and sensory functions are intact.     Lab Results:  [facility] 07/30/11 0520  WBC 10.8*  HGB 9.4*  PLT 265    Basename 07/30/11 0520  NA 135  K 4.9  CL 99  CO2 28  GLUCOSE 254*  BUN 20  CREATININE 0.97   Telemetry: Normal sinus rhythm  Assessment/Plan:   1.  Atrial fibrillation:  The patient has transient A-fib.  I would suggest Amiodarone 200 mg a day for 2-3 months.  His EP doctor will be able to see how much AF he has when his ICD is interrigated.    2.  CAD (coronary artery disease):  The patient is doing well following his coronary artery bypass grafting. He's currently on atenolol.  Will restart his aspirin.  3.  Congestive heart failure:  His ejection fraction is around 50%.    4.  Diabetes mellitus:  He has long-standing diabetes. He will return to his general medical Dr.    5.  Ventricular fibrillation: He has a history of ventricular tachycardia/ventricular fibrillation that was likely due to ischemia. He's not had any recurrent arrhythmias following his bypass grafting.  He has an AICD placed. He'll be seen in device clinic.    6.  aortic stenosis: S/P AVR  (aortic valve replacement) (07/25/2011)  he's doing very well following his aortic valve surgery.  Disposition:  I have given him my card and will be happy to see him in follow up if he would like.  He goes to the [place] typically.      Sean [name], Sean [name]., MD, [facility] 07/31/2011, 7:37 AM LOS: Day 15

## 2011-07-31 NOTE — Progress Notes (Signed)
Chest tube sutures discontinued per MD order and per hospital protocol.  Steri strips applied  Pt reminded to keep strips on for a week to ten days.  Incisions painted with betadine.  WIll continue to monitor closely

## 2011-07-31 NOTE — Progress Notes (Signed)
Cardiac Rehab Phase I  0930 - 1020 Pt education (pt/family) done with understanding. Permission for out pt cardiac rehab.  Rosalie Doctor

## 2011-07-31 NOTE — Progress Notes (Signed)
8 Days Post-Op  Procedure(s) (LRB): CORONARY ARTERY BYPASS GRAFTING (CABG) (N/A) AORTIC VALVE REPLACEMENT (AVR) (N/A) Subjective: Continues to feel better  Objective  Telemetry NSR  Temp:  [97.8 F (36.6 C)-99.2 F (37.3 C)] 97.8 F (36.6 C) (12/12 0622) Pulse Rate:  [76-85] 76  (12/12 0622) Resp:  [20] 20  (12/12 0622) BP: (117-130)/(67-77) 120/67 mmHg (12/12 0622) SpO2:  [90 %-93 %] 93 % (12/12 0622) FiO2 (%):  [3 %] 3 % (12/12 0622) Weight:  [200 lb 12.8 oz (91.082 kg)] 200 lb 12.8 oz (91.082 kg) (12/12 0622)   Intake/Output Summary (Last 24 hours) at 07/31/11 0750 Last data filed at 07/31/11 0500  Gross per 24 hour  Intake    360 ml  Output      0 ml  Net    360 ml       General appearance: alert, cooperative and no distress Heart: regular rate and rhythm Lungs: mildly diminished in bases Abdomen: soft, non-tender; bowel sounds normal; no masses,  no organomegaly Extremities: extremities normal, atraumatic, no cyanosis or edema Wound: incisions healing well without signs of infection  Lab Results:  Basename 07/30/11 0520  NA 135  K 4.9  CL 99  CO2 28  GLUCOSE 254*  BUN 20  CREATININE 0.97  CALCIUM 9.3  MG --  PHOS --   No results found for this basename: AST:2,ALT:2,ALKPHOS:2,BILITOT:2,PROT:2,ALBUMIN:2 in the last 72 hours No results found for this basename: LIPASE:2,AMYLASE:2 in the last 72 hours  Basename 07/30/11 0520  WBC 10.8*  NEUTROABS --  HGB 9.4*  HCT 28.9*  MCV 91.5  PLT 265   No results found for this basename: CKTOTAL:4,CKMB:4,TROPONINI:4 in the last 72 hours No components found with this basename: POCBNP:3 No results found for this basename: DDIMER in the last 72 hours No results found for this basename: HGBA1C in the last 72 hours No results found for this basename: CHOL,HDL,LDLCALC,TRIG,CHOLHDL in the last 72 hours No results found for this basename: TSH,T4TOTAL,FREET3,T3FREE,THYROIDAB in the last 72 hours No results found for  this basename: VITAMINB12,FOLATE,FERRITIN,TIBC,IRON,RETICCTPCT in the last 72 hours  Medications: Scheduled    . amiodarone  400 mg Oral BID  . aspirin EC  325 mg Oral Daily  . atenolol  25 mg Oral Daily  . budesonide-formoterol  2 puff Inhalation BID  . cyclobenzaprine  10 mg Oral TID  . enoxaparin  40 mg Subcutaneous QHS  . fluticasone  2 spray Each Nare Daily  . furosemide  40 mg Oral Daily  . gabapentin  100 mg Oral TID  . insulin aspart  0-24 Units Subcutaneous TID AC & HS  . insulin glargine  65 Units Subcutaneous Daily  . polysaccharide iron  150 mg Oral Daily  . potassium chloride  20 mEq Oral BID  . sodium chloride  3 mL Intravenous Q12H  . DISCONTD: insulin glargine  56 Units Subcutaneous Daily     Radiology/Studies:  Dg Chest 2 View  07/30/2011  *RADIOLOGY REPORT*  Clinical Data: Post heart surgery, cough, soreness  CHEST - 2 VIEW  Comparison: 07/26/2011  Findings: Enlargement of cardiac silhouette post CABG and AVR. Left subclavian AICD lead tip projects over the right ventricle. Atherosclerotic calcification aortic arch. Mild pulmonary vascular congestion. Decreased bibasilar atelectasis, though mild left basilar atelectasis and small left pleural effusion persist. No segmental consolidation, acute pulmonary edema or pneumothorax.  IMPRESSION: Improving bibasilar aeration.  Original Report Authenticated By: Lollie Marrow, M.D.    INR: Will add last result for  INR, ABG once components are confirmed Will add last 4 CBG results once components are confirmed  Assessment/Plan: S/P Procedure(s) (LRB): CORONARY ARTERY BYPASS GRAFTING (CABG) (N/A) AORTIC VALVE REPLACEMENT (AVR) (N/A) 1. appears ready for d/c with home O2 2.cbg this am on 65 of lantus, patient aware of adjusting insulin at home based on counting carbs, as well as cbg results 3. No further afib, discussed with Dr Elease Hashimoto, who recs . No coumadin at this time and home on amiodarone 200 mg daily for 3 months. If  recurrent afib as outpatient, plans may change.   LOS: 15 days    Samanta Gal E 12/12/20127:50 AM

## 2011-07-31 NOTE — Discharge Summary (Signed)
I agree with the above discharge summary and plan for follow-up.  [name],[name]  

## 2011-08-05 NOTE — Anesthesia Postprocedure Evaluation (Signed)
  Anesthesia Post-op Note  Patient: Sean [name]  Procedure(s) Performed:  CORONARY ARTERY BYPASS GRAFTING (CABG) - CABG x 3 using bilateral greater saphenous veins harvested endoscopically; AORTIC VALVE REPLACEMENT (AVR)  Patient Location: SICU  Anesthesia Type: General  Level of Consciousness: alert   Airway and Oxygen Therapy: Patient connected to face mask oxygen  Post-op Pain: mild  Post-op Assessment: Post-op Vital signs reviewed  Post-op Vital Signs: stable  Complications: No apparent anesthesia complications

## 2011-08-09 ENCOUNTER — Telehealth: Payer: Self-pay | Admitting: Cardiovascular Disease

## 2011-08-09 MED ORDER — FUROSEMIDE 40 MG PO TABS: 40.0000 mg | ORAL_TABLET | Freq: Every day | ORAL | Status: DC

## 2011-08-09 MED ORDER — POTASSIUM CHLORIDE CRYS ER 20 MEQ PO TBCR: 20.0000 meq | EXTENDED_RELEASE_TABLET | Freq: Every day | ORAL | Status: DC

## 2011-08-09 NOTE — Telephone Encounter (Signed)
New Problem:   Patient had a triple bypass and Aortic valve repalcement surgery on 07/23/11 and was released last Wednesday 07/31/11 and he has gained 5 pounds between 08/05/11 and 08/07/11 while he was taking his fluid pills and still has some shortness of breath. She would like to know what to do now. Please advise.

## 2011-08-09 NOTE — Telephone Encounter (Signed)
Patient's wife called,stating patient has gained 5 lbs.within the last week.States finished furosemide 40 mg and potassium 20 meq this past Wednesday 08/07/11.No swelling in feet and legs.Sob is the same.Spoke with Norma Fredrickson NP.She advised to take furosemide 40 mg daily and potassium 20 meq daily for 7 more days.Keep appointment with Norma Fredrickson NP 08/21/11.Advised to call sooner if needed.

## 2011-08-19 ENCOUNTER — Telehealth: Payer: Self-pay | Admitting: Cardiovascular Disease

## 2011-08-19 NOTE — Telephone Encounter (Signed)
New msg Pt wife called Pt recently had bypass surgery. She said he woke up last night with leg ankle pain. She wants to know how would she tell if he has a blood clot. Please call her back.

## 2011-08-19 NOTE — Telephone Encounter (Signed)
Would continue to monitor. Probably ok if no signs of swelling or redness.

## 2011-08-19 NOTE — Telephone Encounter (Signed)
Advised wife, will keep appointment scheduled for Wednesday

## 2011-08-19 NOTE — Telephone Encounter (Signed)
Per wife, pain started about 2 days ago. Denies any swelling, change in temperature, or change in color.  Wife states pain does not go up into back of leg with movement.  Does have follow up appointment Wednesday am.  Continue to monitor or doppler?

## 2011-08-21 ENCOUNTER — Ambulatory Visit (INDEPENDENT_AMBULATORY_CARE_PROVIDER_SITE_OTHER): Payer: Medicare Other | Admitting: Nurse Practitioner

## 2011-08-21 ENCOUNTER — Encounter: Payer: Self-pay | Admitting: Nurse Practitioner

## 2011-08-21 DIAGNOSIS — Z79899 Other long term (current) drug therapy: Secondary | ICD-10-CM

## 2011-08-21 DIAGNOSIS — Z951 Presence of aortocoronary bypass graft: Secondary | ICD-10-CM

## 2011-08-21 DIAGNOSIS — I10 Essential (primary) hypertension: Secondary | ICD-10-CM

## 2011-08-21 DIAGNOSIS — I2510 Atherosclerotic heart disease of native coronary artery without angina pectoris: Secondary | ICD-10-CM

## 2011-08-21 LAB — CBC WITH DIFFERENTIAL/PLATELET
Basophils Absolute: 0.1 10*3/uL (ref 0.0–0.1)
Basophils Relative: 0.7 % (ref 0.0–3.0)
Eosinophils Absolute: 0.5 10*3/uL (ref 0.0–0.7)
Eosinophils Relative: 6.4 % — ABNORMAL HIGH (ref 0.0–5.0)
HCT: 39.6 % (ref 39.0–52.0)
Hemoglobin: 13 g/dL (ref 13.0–17.0)
Lymphocytes Relative: 31.9 % (ref 12.0–46.0)
Lymphs Abs: 2.4 10*3/uL (ref 0.7–4.0)
MCHC: 32.8 g/dL (ref 30.0–36.0)
MCV: 90.3 fl (ref 78.0–100.0)
Monocytes Absolute: 0.6 10*3/uL (ref 0.1–1.0)
Monocytes Relative: 7.6 % (ref 3.0–12.0)
Neutro Abs: 4 10*3/uL (ref 1.4–7.7)
Neutrophils Relative %: 53.4 % (ref 43.0–77.0)
Platelets: 332 10*3/uL (ref 150.0–400.0)
RBC: 4.39 Mil/uL (ref 4.22–5.81)
RDW: 16 % — ABNORMAL HIGH (ref 11.5–14.6)
WBC: 7.5 10*3/uL (ref 4.5–10.5)

## 2011-08-21 LAB — BASIC METABOLIC PANEL
BUN: 15 mg/dL (ref 6–23)
CO2: 26 mEq/L (ref 19–32)
Calcium: 9.1 mg/dL (ref 8.4–10.5)
Chloride: 105 mEq/L (ref 96–112)
Creatinine, Ser: 0.9 mg/dL (ref 0.4–1.5)
GFR: 92.96 mL/min (ref >60.00)
Glucose, Bld: 194 mg/dL — ABNORMAL HIGH (ref 70–99)
Potassium: 3.7 mEq/L (ref 3.5–5.1)
Sodium: 140 mEq/L (ref 135–145)

## 2011-08-21 NOTE — Assessment & Plan Note (Signed)
He has had no medicines today. His wife will check his blood pressure at home. I will see him back in 2 weeks. He is not on ACE.

## 2011-08-21 NOTE — Assessment & Plan Note (Signed)
He is on amiodarone at 200 mg per day. I have left him on this dose. I suspect this may be a long term drug for him. I will have Dr. Elease Hashimoto to review.

## 2011-08-21 NOTE — Patient Instructions (Signed)
I want you to check your blood pressure 2 times a day and keep a diary. Goal is to be less than 130/80.  Stay on your current medicines  We will send a referral for cardiac rehab.  You may restart your Paxil.  We are going to check some labs today.  We will see you back in 2 weeks.

## 2011-08-21 NOTE — Progress Notes (Signed)
Sean [name] Date of Birth: 1946/03/04 Medical Record #[id number]  History of Present Illness: Sean [name] is seen today for a post hospital visit. He is seen for Dr. Elease [name]. He has had recent CABG x 3 along with AVR. His EF is normal. He originally presented with chest pain and an ICD shock to the [facility] ER. He has been home about 2 1/2 weeks. He is doing well. He typically gets his care through the [place] in [place] and [place].   He comes in today. He is here with his wife. He seems to be doing ok. Not sleeping well but this is chronic. He would like to restart his Paxil. He has PTSD. Not walking due to the weather. Has not heard from cardiac rehab. No shortness of breath. Has some soreness in both of his legs. Did have vein harvesting from both legs. No cough, fever or chills. Does note that his vision seems a little blurry. No other neuro symptoms. No ICD shocks. Has had no medicines today. Has not been checking his blood pressure at home. Has not used his oxygen. Not smoking. Bowels are ok. Appetite is fair.   Current Outpatient Prescriptions on File Prior to Visit  Medication Sig Dispense Refill  . albuterol (PROVENTIL HFA;VENTOLIN HFA) 108 (90 BASE) MCG/ACT inhaler Inhale 2 puffs into the lungs every 6 (six) hours as needed.        Marland Kitchen amiodarone (PACERONE) 200 MG tablet Take 1 tablet (200 mg total) by mouth daily.  30 tablet  3  . atenolol (TENORMIN) 25 MG tablet Take 1 tablet (25 mg total) by mouth daily.  30 tablet  1  . budesonide-formoterol (SYMBICORT) 160-4.5 MCG/ACT inhaler Inhale 2 puffs into the lungs as needed.       . cholecalciferol (D-VI-SOL) 400 UNIT/ML LIQD Take 400 Units by mouth daily.        . flunisolide (NASALIDE) 0.025 % SOLN Inhale 2 sprays into the lungs 2 (two) times daily.        . insulin aspart (NOVOLOG) 100 UNIT/ML injection Inject 12 Units into the skin 3 (three) times daily before meals.       . insulin glargine (LANTUS) 100 UNIT/ML injection Inject 65 Units into  the skin at bedtime.  10 mL  1  . polysaccharide iron (NIFEREX) 150 MG CAPS capsule Take 1 capsule (150 mg total) by mouth daily.  30 each  1    Allergies  Allergen Reactions  . Novocain Other (See Comments)    unknown  . Rosuvastatin Other (See Comments)    Muscle pain    Past Medical History  Diagnosis Date  . Myocardial infarction   . Ventricular tachycardia   . Diabetes mellitus   . COPD (chronic obstructive pulmonary disease)     Past Surgical History  Procedure Date  . Pacemaker insertion   . Nose surgery     skin cancer  . Cardiac catheterization   . Coronary angioplasty   . Tee without cardioversion 07/22/2011    Procedure: TRANSESOPHAGEAL ECHOCARDIOGRAM (TEE);  Surgeon: Lewayne Bunting, MD;  Location: [facility] END[facility]COPY;  Service: Cardiovascular;  Laterality: N/A;  . Coronary artery bypass graft 07/23/2011    Procedure: CORONARY ARTERY BYPASS GRAFTING (CABG);  Surgeon: Purcell Nails, MD;  Location: [facility] OR;  Service: Open Heart Surgery;  Laterality: N/A;  CABG x 3 using bilateral greater saphenous veins harvested endoscopically  . Aortic valve replacement 07/23/2011    Procedure: AORTIC VALVE REPLACEMENT (AVR);  Surgeon: Marilu Favre  Zadie Cleverly, MD;  Location: [facility] OR;  Service: Open Heart Surgery;  Laterality: N/A;    History  Smoking status  . Former Smoker -- 1.0 packs/day  . Quit date: 07/16/2011  Smokeless tobacco  . Not on file    History  Alcohol Use No    No family history on file.  Review of Systems: The review of systems is per the HPI.  All other systems were reviewed and are negative.  Physical Exam: Ht 6' (1.829 m)  Wt 208 lb (94.348 kg)  BMI 28.21 kg/m2 Patient is very pleasant and in no acute distress. Skin is warm and dry. Color is normal.  HEENT is unremarkable except for some missing teeth. Normocephalic/atraumatic. PERRL. Sclera are nonicteric. Neck is supple. No masses. No JVD. Lungs are clear with decreased breath sounds. Cardiac exam shows a  regular rate and rhythm. Abdomen is soft. Extremities are without edema. Gait and ROM are intact. No gross neurologic deficits noted.  LABORATORY DATA: EKG shows sinus, 1st degree AV block, prior anterior MI, diffuse T wave changes. Labs are pending.   Assessment / Plan:

## 2011-08-21 NOTE — Assessment & Plan Note (Addendum)
He is s/p CABG and AVR. He is doing ok. We will check his labs today. We will refer to cardiac rehab. He has a normal EF. We will probably need to treat his blood pressure, but he has had no medicines today. Will watch the issue with his blurred vision. Sugars have been ok. Congratulated for not smoking. Will need follow up echo in a couple of weeks.

## 2011-08-22 ENCOUNTER — Other Ambulatory Visit: Payer: Self-pay | Admitting: Thoracic Surgery (Cardiothoracic Vascular Surgery)

## 2011-08-22 DIAGNOSIS — I2510 Atherosclerotic heart disease of native coronary artery without angina pectoris: Secondary | ICD-10-CM

## 2011-08-26 ENCOUNTER — Ambulatory Visit (INDEPENDENT_AMBULATORY_CARE_PROVIDER_SITE_OTHER): Payer: Self-pay | Admitting: Thoracic Surgery (Cardiothoracic Vascular Surgery)

## 2011-08-26 ENCOUNTER — Ambulatory Visit
Admission: RE | Admit: 2011-08-26 | Discharge: 2011-08-26 | Disposition: A | Discharge status: Home / Self Care | Payer: Medicare Other | Source: Ambulatory Visit | Attending: Thoracic Surgery (Cardiothoracic Vascular Surgery) | Admitting: Thoracic Surgery (Cardiothoracic Vascular Surgery)

## 2011-08-26 ENCOUNTER — Encounter: Payer: Self-pay | Admitting: Thoracic Surgery (Cardiothoracic Vascular Surgery)

## 2011-08-26 VITALS — BP 146/76 | HR 67 | Resp 20 | Ht 72.0 in | Wt 210.0 lb

## 2011-08-26 DIAGNOSIS — Z954 Presence of other heart-valve replacement: Secondary | ICD-10-CM

## 2011-08-26 DIAGNOSIS — Z952 Presence of prosthetic heart valve: Secondary | ICD-10-CM

## 2011-08-26 DIAGNOSIS — I350 Nonrheumatic aortic (valve) stenosis: Secondary | ICD-10-CM

## 2011-08-26 DIAGNOSIS — Z951 Presence of aortocoronary bypass graft: Secondary | ICD-10-CM

## 2011-08-26 DIAGNOSIS — I2510 Atherosclerotic heart disease of native coronary artery without angina pectoris: Secondary | ICD-10-CM

## 2011-08-26 DIAGNOSIS — I359 Nonrheumatic aortic valve disorder, unspecified: Secondary | ICD-10-CM

## 2011-08-26 NOTE — Patient Instructions (Addendum)
The patient has been reminded to continue to avoid any heavy lifting or strenuous use of arms or shoulders for at least a total of three months from the time of surgery. The patient has been instructed that they may return driving an automobile as long as they are no longer requiring oral narcotic pain relievers during the daytime.  They have been advised to start driving short distances during the daylight and gradually increase from there as they feel comfortable.  Keep an eye on blood pressure and seek follow up with Dr Elease Hashimoto and colleagues to consider adding ACE-I or ARB if BP remains elevated.

## 2011-08-26 NOTE — Progress Notes (Signed)
[address]            [place] [address]          [phone]     CARDIOTHORACIC SURGERY OFFICE NOTE  Referring Provider is Nahser, Tiajuana Amass., MD PCP is Roswell Miners, MD   HPI:  Patient returns for follow-up Status post aortic valve replacement and coronary artery bypass grafting x3 on 07/23/2011. The patient's early postoperative recovery was notable for some transient postoperative atrial fibrillation which converted spontaneously to sinus rhythm. The patient otherwise recovered uneventfully and was discharged home. He did require home oxygen therapy at the time of discharge to due to persistent hypoxemia with history of long-standing chronic obstructive pulmonary disease and tobacco abuse. Since hospital discharge the patient has done well. He has continued to abstain from any tobacco use. He is breathing quite well and reports that he hasn't used oxygen in 2 weeks. He has had minimal pain in his chest and is not using any sort of pain relievers. He has not had any tachypalpitations or dizzy spells. He has mild soreness in the left thigh related to endoscopic vein harvest. His appetite is good. The remainder of his review of systems unremarkable.   Current Outpatient Prescriptions  Medication Sig Dispense Refill  . albuterol (PROVENTIL HFA;VENTOLIN HFA) 108 (90 BASE) MCG/ACT inhaler Inhale 2 puffs into the lungs every 6 (six) hours as needed.        Marland Kitchen amiodarone (PACERONE) 200 MG tablet Take 1 tablet (200 mg total) by mouth daily.  30 tablet  3  . aspirin 81 MG tablet Take 160 mg by mouth daily.        Marland Kitchen atenolol (TENORMIN) 25 MG tablet Take 1 tablet (25 mg total) by mouth daily.  30 tablet  1  . atorvastatin (LIPITOR) 80 MG tablet Take 40 mg by mouth daily.        . budesonide-formoterol (SYMBICORT) 160-4.5 MCG/ACT inhaler Inhale 2 puffs into the lungs as needed.       . cholecalciferol (D-VI-SOL) 400 UNIT/ML LIQD Take 400 Units by mouth daily.         . flunisolide (NASALIDE) 0.025 % SOLN Inhale 2 sprays into the lungs 2 (two) times daily.        Marland Kitchen gabapentin (NEURONTIN) 600 MG tablet Take 600 mg by mouth 3 (three) times daily.        . insulin aspart (NOVOLOG) 100 UNIT/ML injection Inject 12 Units into the skin 3 (three) times daily before meals.       . insulin glargine (LANTUS) 100 UNIT/ML injection Inject 65 Units into the skin at bedtime.  10 mL  1  . polysaccharide iron (NIFEREX) 150 MG CAPS capsule Take 1 capsule (150 mg total) by mouth daily.  30 each  1      Physical Exam:   BP 146/76  Pulse 67  Resp 20  Ht 6' (1.829 m)  Wt 210 lb (95.255 kg)  BMI 28.48 kg/m2  SpO2 95%  General:  Well-appearing male  Chest:   Clear to auscultation and symmetrical bilaterally. A median sternotomy incision is healing nicely.  CV:   Regular rate and rhythm with no murmurs  Abdomen:  Soft and nontender  Extremities:  Warm and well-perfused with no lower extremity edema. The small incisions from endoscopic vein harvest are healing nicely  Diagnostic Tests:  Clear lung fields bilaterally with  no significant pleural effusions. All the sternal wires appear intact. No other abnormalities are noted   Impression:  Excellent progress following recent aortic valve replacement and coronary artery bypass grafting.  Plan:  I've encouraged patient to continue to gradually increase his physical activity as tolerated with his primary limitation remaining that he refrain from any heavy lifting or strenuous use of his arms or shoulders for at least another 2 months. I've reminded him to continue to abstain from any tobacco use. I've encouraged him to enroll in cardiac rehabilitation. I think it would be reasonable to stop taking amiodarone when his current prescription runs out. All of his questions been addressed. In the future the patient will call and return to see [place] as needed.   Salvatore Decent. Cornelius Moras, MD 08/26/2011 3:24 PM

## 2011-09-04 ENCOUNTER — Other Ambulatory Visit: Payer: Self-pay | Admitting: *Deleted

## 2011-09-04 ENCOUNTER — Ambulatory Visit (INDEPENDENT_AMBULATORY_CARE_PROVIDER_SITE_OTHER): Payer: Medicare Other | Admitting: Nurse Practitioner

## 2011-09-04 ENCOUNTER — Encounter: Payer: Self-pay | Admitting: Nurse Practitioner

## 2011-09-04 VITALS — BP 150/92 | HR 80 | Ht 72.0 in | Wt 210.8 lb

## 2011-09-04 DIAGNOSIS — I10 Essential (primary) hypertension: Secondary | ICD-10-CM

## 2011-09-04 DIAGNOSIS — I4901 Ventricular fibrillation: Secondary | ICD-10-CM

## 2011-09-04 DIAGNOSIS — Z79899 Other long term (current) drug therapy: Secondary | ICD-10-CM

## 2011-09-04 DIAGNOSIS — I2510 Atherosclerotic heart disease of native coronary artery without angina pectoris: Secondary | ICD-10-CM

## 2011-09-04 LAB — BASIC METABOLIC PANEL
BUN: 14 mg/dL (ref 6–23)
CO2: 24 mEq/L (ref 19–32)
Calcium: 9.4 mg/dL (ref 8.4–10.5)
Chloride: 101 mEq/L (ref 96–112)
Creatinine, Ser: 0.8 mg/dL (ref 0.4–1.5)
GFR: 96.8 mL/min (ref >60.00)
Glucose, Bld: 259 mg/dL — ABNORMAL HIGH (ref 70–99)
Potassium: 4.8 mEq/L (ref 3.5–5.1)
Sodium: 139 mEq/L (ref 135–145)

## 2011-09-04 MED ORDER — LISINOPRIL 10 MG PO TABS: 10.0000 mg | ORAL_TABLET | Freq: Every day | ORAL | Status: DC

## 2011-09-04 NOTE — Assessment & Plan Note (Signed)
I have talked with Dr. Elease Hashimoto. Will let him finish his current supply of amiodarone and not refill.

## 2011-09-04 NOTE — Assessment & Plan Note (Signed)
He continues to do very well. I have added ACE for his blood pressure. Ok to resume sex as tolerated.  I have talked with Dr. Melburn Popper. He will finish his current supply of amiodarone and not refill. I have talked with Dr. Graciela Husbands. Ok to resume driving.

## 2011-09-04 NOTE — Assessment & Plan Note (Signed)
Blood pressure is consistently up at home and is elevated here. I have started him on 10 mg of Lisinopril, taking first dose tonight and then one a day. I will see him back in 3 weeks. We will check a BMET today and again on return. His wife will continue to monitor. Patient is agreeable to this plan and will call if any problems develop in the interim.

## 2011-09-04 NOTE — Assessment & Plan Note (Signed)
No recurrent shocks reported.

## 2011-09-04 NOTE — Progress Notes (Signed)
Sean [name] Date of Birth: 12/23/1945 Medical Record #[id number]  History of Present Illness: Sean [name] is seen today for a follow up visit. He is seen for Dr. Elease Hashimoto. He Has had recent CABG x 3 with AVR. He had originally presented with chest pain and had an ICD shock. Has been home about a month. We restarted his Paxil at his last visit. Blood pressure was high and I asked him to monitor. His EF is normal.   He comes in today. He is doing well. He is more active. Says his breathing is great. No chest pain. Not short of breath. Able to walk up 3 flights of steps. Wanting to have sex. Wanting to drive. No ICD shocks. He is planning on going to cardiac rehab at the [place]. Wife has been monitoring his blood pressure and it has remained elevated. Primarily staying the in 160 systolic range.   Current Outpatient Prescriptions on File Prior to Visit  Medication Sig Dispense Refill  . albuterol (PROVENTIL HFA;VENTOLIN HFA) 108 (90 BASE) MCG/ACT inhaler Inhale 2 puffs into the lungs every 6 (six) hours as needed.        Marland Kitchen amiodarone (PACERONE) 200 MG tablet Take 1 tablet (200 mg total) by mouth daily.  30 tablet  3  . aspirin 81 MG tablet Take 81 mg by mouth daily.       Marland Kitchen atenolol (TENORMIN) 25 MG tablet Take 1 tablet (25 mg total) by mouth daily.  30 tablet  1  . atorvastatin (LIPITOR) 80 MG tablet Take 40 mg by mouth daily.        . budesonide-formoterol (SYMBICORT) 160-4.5 MCG/ACT inhaler Inhale 2 puffs into the lungs as needed.       . cholecalciferol (D-VI-SOL) 400 UNIT/ML LIQD Take 400 Units by mouth daily.        . flunisolide (NASALIDE) 0.025 % SOLN Inhale 2 sprays into the lungs 2 (two) times daily.        Marland Kitchen gabapentin (NEURONTIN) 600 MG tablet Take 600 mg by mouth 3 (three) times daily.        . insulin aspart (NOVOLOG) 100 UNIT/ML injection Inject 12 Units into the skin 3 (three) times daily before meals.       . insulin glargine (LANTUS) 100 UNIT/ML injection Inject 65 Units into the  skin at bedtime.  10 mL  1  . polysaccharide iron (NIFEREX) 150 MG CAPS capsule Take 1 capsule (150 mg total) by mouth daily.  30 each  1    Allergies  Allergen Reactions  . Novocain Other (See Comments)    unknown  . Rosuvastatin Other (See Comments)    Muscle pain    Past Medical History  Diagnosis Date  . Myocardial infarction   . Ventricular tachycardia   . Diabetes mellitus   . COPD (chronic obstructive pulmonary disease)   . CAD (coronary artery disease) Dec 2012    s/p CABG x 3 with AVR; has normal EF  . Aortic stenosis Dec 2012    s/p AVR  . S/P ICD (internal cardiac defibrillator) procedure   . PTSD (post-traumatic stress disorder)   . HTN (hypertension)     Past Surgical History  Procedure Date  . Icd implant   . Nose surgery     skin cancer  . Cardiac catheterization   . Coronary angioplasty   . Tee without cardioversion 07/22/2011    Procedure: TRANSESOPHAGEAL ECHOCARDIOGRAM (TEE);  Surgeon: Lewayne Bunting, MD;  Location: [facility] ENDOSCOPY;  Service: Cardiovascular;  Laterality: N/A;  . Coronary artery bypass graft 07/23/2011    Procedure: CORONARY ARTERY BYPASS GRAFTING (CABG);  Surgeon: Purcell Nails, MD;  Location: [facility] OR;  Service: Open Heart Surgery;  Laterality: N/A;  CABG x 3 using bilateral greater saphenous veins harvested endoscopically  . Aortic valve replacement 07/23/2011    Procedure: AORTIC VALVE REPLACEMENT (AVR);  Surgeon: Purcell Nails, MD;  Location: [facility] OR;  Service: Open Heart Surgery;  Laterality: N/A;    History  Smoking status  . Former Smoker -- 1.0 packs/day  . Quit date: 07/16/2011  Smokeless tobacco  . Not on file    History  Alcohol Use No    No family history on file.  Review of Systems: The review of systems is per the HPI.  All other systems were reviewed and are negative.  Physical Exam: BP 150/92  Pulse 80  Ht 6' (1.829 m)  Wt 210 lb 12.8 oz (95.618 kg)  BMI 28.59 kg/m2 Patient is very pleasant and in no acute  distress. Skin is warm and dry. Color is normal.  HEENT is unremarkable except for a deformed nose and missing teeth. Normocephalic/atraumatic. PERRL. Sclera are nonicteric. Neck is supple. No masses. No JVD. Lungs are clear. Cardiac exam shows a regular rate and rhythm. Abdomen is soft. Extremities are without edema. Gait and ROM are intact. No gross neurologic deficits noted.   LABORATORY DATA: BMET is pending.   Assessment / Plan:

## 2011-09-04 NOTE — Patient Instructions (Signed)
Finish your current supply of amiodarone and then stop. Do not refill.  I am going to put you on some blood pressure medicine. It is Lisinopril 10 mg daily. Take your first dose tonight and then one each day.  We are going to check your potassium today and when you come back.  Keep a check on your blood pressure.   I will see you in 3 weeks.  Call the [facility] office at [phone] if you have any questions, problems or concerns.

## 2011-09-19 ENCOUNTER — Ambulatory Visit (HOSPITAL_COMMUNITY): Payer: Medicare Other

## 2011-09-25 ENCOUNTER — Ambulatory Visit (INDEPENDENT_AMBULATORY_CARE_PROVIDER_SITE_OTHER): Payer: Medicare Other | Admitting: Nurse Practitioner

## 2011-09-25 ENCOUNTER — Other Ambulatory Visit (INDEPENDENT_AMBULATORY_CARE_PROVIDER_SITE_OTHER): Payer: Medicare Other | Admitting: *Deleted

## 2011-09-25 ENCOUNTER — Encounter: Payer: Self-pay | Admitting: Nurse Practitioner

## 2011-09-25 VITALS — BP 128/82 | HR 80 | Ht 72.0 in | Wt 214.0 lb

## 2011-09-25 DIAGNOSIS — I10 Essential (primary) hypertension: Secondary | ICD-10-CM

## 2011-09-25 DIAGNOSIS — I4891 Unspecified atrial fibrillation: Secondary | ICD-10-CM

## 2011-09-25 DIAGNOSIS — Z954 Presence of other heart-valve replacement: Secondary | ICD-10-CM

## 2011-09-25 DIAGNOSIS — Z952 Presence of prosthetic heart valve: Secondary | ICD-10-CM

## 2011-09-25 DIAGNOSIS — Z951 Presence of aortocoronary bypass graft: Secondary | ICD-10-CM

## 2011-09-25 LAB — BASIC METABOLIC PANEL
BUN: 16 mg/dL (ref 6–23)
CO2: 24 mEq/L (ref 19–32)
Calcium: 9.2 mg/dL (ref 8.4–10.5)
Chloride: 104 mEq/L (ref 96–112)
Creatinine, Ser: 0.9 mg/dL (ref 0.4–1.5)
GFR: 87.13 mL/min (ref >60.00)
Glucose, Bld: 311 mg/dL — ABNORMAL HIGH (ref 70–99)
Potassium: 4 mEq/L (ref 3.5–5.1)
Sodium: 137 mEq/L (ref 135–145)

## 2011-09-25 MED ORDER — AMOXICILLIN 500 MG PO CAPS: ORAL_CAPSULE | ORAL | Status: DC

## 2011-09-25 NOTE — Assessment & Plan Note (Signed)
Will check a follow up echo. I have sent a prescription for Amoxicillin 2 gm to take 30 to 60 minutes prior to dental procedures.

## 2011-09-25 NOTE — Patient Instructions (Addendum)
I think you are doing very well.   Stay on your current medicines.  We are going to get an ultrasound of your heart to look at your new valve.  We will see you in 3 months. You will have fasting labs at that visit. Do not eat that morning.   Ok to start cardiac rehab.  I have sent a prescription for your antibiotics to take before you go to the dentist.   Call the [facility] office at [phone] if you have any questions, problems or concerns.

## 2011-09-25 NOTE — Progress Notes (Signed)
Sean [name] Date of Birth: 1946/04/26 Medical Record #[id number]  History of Present Illness: Sean [name] is seen back today for a 3 week check. He is seen for Dr. Elease Hashimoto. He had recent CABG and AVR per Dr. Cornelius Moras on December 4th. Has done very well. Now off of amiodarone. Was given the ok to drive. No ICD shocks. We started him on Lisinopril 10 mg for his blood pressure. Has not had a post op echo.   He comes in today. He is here alone. He is doing very well. Blood pressure has come down nicely. He feels good. Will be starting rehab at [facility] this week. Does need to go to the dentist next week. Does not have any antibiotics for SBE prophylaxis. Not smoking.   Current Outpatient Prescriptions on File Prior to Visit  Medication Sig Dispense Refill  . albuterol (PROVENTIL HFA;VENTOLIN HFA) 108 (90 BASE) MCG/ACT inhaler Inhale 2 puffs into the lungs every 6 (six) hours as needed.        Marland Kitchen aspirin 81 MG tablet Take 81 mg by mouth daily.       Marland Kitchen atenolol (TENORMIN) 25 MG tablet Take 1 tablet (25 mg total) by mouth daily.  30 tablet  1  . atorvastatin (LIPITOR) 80 MG tablet Take 40 mg by mouth daily.        . budesonide-formoterol (SYMBICORT) 160-4.5 MCG/ACT inhaler Inhale 2 puffs into the lungs as needed.       . cholecalciferol (D-VI-SOL) 400 UNIT/ML LIQD Take 400 Units by mouth daily.        . flunisolide (NASALIDE) 0.025 % SOLN Inhale 2 sprays into the lungs 2 (two) times daily.        Marland Kitchen gabapentin (NEURONTIN) 600 MG tablet Take 600 mg by mouth 3 (three) times daily.        . insulin aspart (NOVOLOG) 100 UNIT/ML injection Inject 12 Units into the skin 3 (three) times daily before meals.       . insulin glargine (LANTUS) 100 UNIT/ML injection Inject 65 Units into the skin at bedtime.  10 mL  1  . lisinopril (PRINIVIL,ZESTRIL) 10 MG tablet Take 1 tablet (10 mg total) by mouth daily.  30 tablet  11  . PARoxetine (PAXIL) 20 MG tablet Take 10 mg by mouth every morning.      . polysaccharide iron  (NIFEREX) 150 MG CAPS capsule Take 1 capsule (150 mg total) by mouth daily.  30 each  1    Allergies  Allergen Reactions  . Novocain Other (See Comments)    unknown  . Rosuvastatin Other (See Comments)    Muscle pain    Past Medical History  Diagnosis Date  . Myocardial infarction   . Ventricular tachycardia   . Diabetes mellitus   . COPD (chronic obstructive pulmonary disease)   . CAD (coronary artery disease) Dec 2012    s/p CABG x 3 with AVR; has normal EF  . Aortic stenosis Dec 2012    s/p AVR  . S/P ICD (internal cardiac defibrillator) procedure   . PTSD (post-traumatic stress disorder)   . HTN (hypertension)   . Tobacco abuse     Past Surgical History  Procedure Date  . Icd implant   . Nose surgery     skin cancer  . Cardiac catheterization   . Coronary angioplasty   . Tee without cardioversion 07/22/2011    Procedure: TRANSESOPHAGEAL ECHOCARDIOGRAM (TEE);  Surgeon: Lewayne Bunting, MD;  Location: [facility] ENDOSCOPY;  Service:  Cardiovascular;  Laterality: N/A;  . Coronary artery bypass graft 07/23/2011    Procedure: CORONARY ARTERY BYPASS GRAFTING (CABG);  Surgeon: Purcell Nails, MD;  Location: [facility] OR;  Service: Open Heart Surgery;  Laterality: N/A;  CABG x 3 using bilateral greater saphenous veins harvested endoscopically  . Aortic valve replacement 07/23/2011    Procedure: AORTIC VALVE REPLACEMENT (AVR);  Surgeon: Purcell Nails, MD;  Location: [facility] OR;  Service: Open Heart Surgery;  Laterality: N/A;    History  Smoking status  . Former Smoker -- 1.0 packs/day  . Quit date: 07/16/2011  Smokeless tobacco  . Not on file    History  Alcohol Use No    History reviewed. No pertinent family history.  Review of Systems: The review of systems is per the HPI. He has no complaint and is doing well.  All other systems were reviewed and are negative.  Physical Exam: BP 128/82  Pulse 80  Ht 6' (1.829 m)  Wt 214 lb (97.07 kg)  BMI 29.02 kg/m2 Patient is very  pleasant and in no acute distress. Skin is warm and dry. Color is normal.  HEENT is unremarkable except for missing teeth and deformed nose. Normocephalic/atraumatic. PERRL. Sclera are nonicteric. Neck is supple. No masses. No JVD. Lungs are clear. Cardiac exam shows a regular rate and rhythm. Valve is crisp. Abdomen is soft. Extremities are without edema. Gait and ROM are intact. No gross neurologic deficits noted.   LABORATORY DATA: Repeat BMET is pending.    Assessment / Plan:

## 2011-09-25 NOTE — Assessment & Plan Note (Addendum)
In sinus rhythm by physical exam today. No longer on amiodarone.

## 2011-09-25 NOTE — Assessment & Plan Note (Signed)
Blood pressure is much better. He has better control at home. He is tolerating his current medicines. Will recheck a BMET today. Will plan on seeing him back in 3 months. Patient is agreeable to this plan and will call if any problems develop in the interim.

## 2011-09-25 NOTE — Assessment & Plan Note (Signed)
He is doing very well. Will focus on risk factor modification. He is given the ok to start cardiac rehab.

## 2011-09-26 ENCOUNTER — Encounter (HOSPITAL_COMMUNITY)
Admission: RE | Admit: 2011-09-26 | Discharge: 2011-09-26 | Disposition: A | Discharge status: Home / Self Care | Payer: Medicare Other | Source: Ambulatory Visit | Attending: Cardiovascular Disease | Admitting: Cardiovascular Disease

## 2011-09-26 ENCOUNTER — Encounter (HOSPITAL_COMMUNITY): Payer: Self-pay

## 2011-09-26 DIAGNOSIS — I359 Nonrheumatic aortic valve disorder, unspecified: Secondary | ICD-10-CM | POA: Insufficient documentation

## 2011-09-26 DIAGNOSIS — J4489 Other specified chronic obstructive pulmonary disease: Secondary | ICD-10-CM | POA: Insufficient documentation

## 2011-09-26 DIAGNOSIS — Z95810 Presence of automatic (implantable) cardiac defibrillator: Secondary | ICD-10-CM | POA: Insufficient documentation

## 2011-09-26 DIAGNOSIS — I723 Aneurysm of iliac artery: Secondary | ICD-10-CM | POA: Insufficient documentation

## 2011-09-26 DIAGNOSIS — I252 Old myocardial infarction: Secondary | ICD-10-CM | POA: Insufficient documentation

## 2011-09-26 DIAGNOSIS — I2510 Atherosclerotic heart disease of native coronary artery without angina pectoris: Secondary | ICD-10-CM | POA: Insufficient documentation

## 2011-09-26 DIAGNOSIS — Z9889 Other specified postprocedural states: Secondary | ICD-10-CM | POA: Insufficient documentation

## 2011-09-26 DIAGNOSIS — Z951 Presence of aortocoronary bypass graft: Secondary | ICD-10-CM | POA: Insufficient documentation

## 2011-09-26 DIAGNOSIS — I509 Heart failure, unspecified: Secondary | ICD-10-CM | POA: Insufficient documentation

## 2011-09-26 DIAGNOSIS — I4891 Unspecified atrial fibrillation: Secondary | ICD-10-CM | POA: Insufficient documentation

## 2011-09-26 DIAGNOSIS — Z886 Allergy status to analgesic agent status: Secondary | ICD-10-CM | POA: Insufficient documentation

## 2011-09-26 DIAGNOSIS — J449 Chronic obstructive pulmonary disease, unspecified: Secondary | ICD-10-CM | POA: Insufficient documentation

## 2011-09-26 DIAGNOSIS — Z5189 Encounter for other specified aftercare: Secondary | ICD-10-CM | POA: Insufficient documentation

## 2011-09-26 DIAGNOSIS — F17200 Nicotine dependence, unspecified, uncomplicated: Secondary | ICD-10-CM | POA: Insufficient documentation

## 2011-09-26 DIAGNOSIS — I4901 Ventricular fibrillation: Secondary | ICD-10-CM | POA: Insufficient documentation

## 2011-09-26 DIAGNOSIS — E7800 Pure hypercholesterolemia, unspecified: Secondary | ICD-10-CM | POA: Insufficient documentation

## 2011-09-26 DIAGNOSIS — E119 Type 2 diabetes mellitus without complications: Secondary | ICD-10-CM | POA: Insufficient documentation

## 2011-09-27 ENCOUNTER — Encounter (HOSPITAL_COMMUNITY): Payer: Medicare Other

## 2011-09-30 ENCOUNTER — Encounter (HOSPITAL_COMMUNITY): Payer: Medicare Other

## 2011-10-02 ENCOUNTER — Encounter (HOSPITAL_COMMUNITY)
Admission: RE | Admit: 2011-10-02 | Discharge: 2011-10-02 | Disposition: A | Discharge status: Home / Self Care | Payer: Medicare Other | Source: Ambulatory Visit | Attending: Cardiovascular Disease | Admitting: Cardiovascular Disease

## 2011-10-02 ENCOUNTER — Other Ambulatory Visit (HOSPITAL_COMMUNITY): Payer: Self-pay

## 2011-10-02 LAB — GLUCOSE, CAPILLARY
Glucose-Capillary: 396 mg/dL — ABNORMAL HIGH (ref 70–99)
Glucose-Capillary: 456 mg/dL — ABNORMAL HIGH (ref 70–99)

## 2011-10-02 NOTE — Progress Notes (Signed)
Cardiac Rehab - 1115 Pt in today for first day of exercise.  Pt blood sugar checked prior to exercise.  Pt blood sugar 396, negative for ketones.  Pt surprised that his blood sugar was that high.  Pt reports that his blood sugar at home prior to breakfast was 154.  Pt questioned about his breakfast meal.  Pt at first thought his sugar was Total (something that started with a T), milk and coffee.  Later questioning revealed he had frosted flakes.  Pt advised to bring his meter in to verify against our meter here at rehab,.  Pt has had his monitor for a year and has not changed his batteries nor checked controls.  Pt instructed to increase his h20 intake to lower his blood sugar.  Will plan to notify his Md at the [place] and Dr. Elease Hashimoto.  Consult with nutritionist.requested.

## 2011-10-02 NOTE — Progress Notes (Signed)
Nutrition Consult Consult for elevated pre-exercise CBG of 396.  Spoke with Karlene Lineman, RN, and pt.  Per discussion with pt, pt likes junk food (desserts, sodas, etc.) and did not take his DM "as seriously as I should have." Pt reports he ate Frosted Flakes, whole milk, and coffee without sugar around 9:30 am.  Pt pre-CBG significantly elevated given CBG taken 2 hours post-prandial.  Pt states he has been educated about his DM and his diet at the [place], but never followed the DM diet because "I didn't like what they wanted me to eat. I'm a meat, potatoes and bread man." Pt does eat "some whole wheat bread and some white bread, eggs, and thick sliced bacon or sausage 3 days a week for breakfast." Healthier pre-exercise breakfast alternatives discussed (e.g. Peanut butter and banana sandwich on whole wheat bread, oatmeal sweetened with a sugar substitute and cinnamon and/or fruit and egg beaters, or a high fiber cereal with milk, fruit, and egg beaters). Pt expressed understanding.  Pt states he does drink "some sodas like caffeine-free diet coke or regular sierra mist."  [employer] discussed.  Per pt, he starts to feel s/s of hypoglycemia when his CBG is around 100-150 mg/dL.  Pt encouraged to continue working toward lower blood glucose and tolerance of lower blood sugar will result.  Plan: 1. Pt to eat one of the healthier breakfast options discussed above. 2. Pt to bring in his glucometer to check against our glucometer. Continue client-centered nutrition education by RD as part of interdisciplinary care.  Monitor and evaluate progress toward nutrition goal with team.

## 2011-10-03 NOTE — Progress Notes (Signed)
Sean [name] 66 y.o. male       Nutrition Screen                                                                    YES  NO Do you live in a nursing home?  X   Do you eat out more than 3 times/week?    X If yes, how many times per week do you eat out?  Do you have food allergies?   X If yes, what are you allergic to?  Have you gained or lost more than 10 lbs without trying?               X If yes, how much weight have you lost and over what time period?  lbs gained or lost over  weeks/month  Do you want to lose weight?    X  If yes, what is a goal weight or amount of weight you would like to lose?  lb  Do you eat alone most of the time?   X   Do you eat less than 2 meals/day?  X If yes, how many meals do you eat?  Do you drink more than 3 alcohol drinks/day?  X If yes, how many drinks per day?  Are you having trouble with constipation? *  X If yes, what are you doing to help relieve constipation?  Do you have financial difficulties with buying food?*    X   Are you experiencing regular nausea/ vomiting?*     X   Do you have a poor appetite? *                                        X   Do you have trouble chewing/swallowing? *   X    Pt with diagnoses of:  X CABG              X Stent/ PTCA X GERD          X COPD X AVR/MVR/ICD      X Dyslipidemia  / HDL< 40 / LDL>70 / High TG      X %  Body fat >goal / Body Mass Index >25 X HTN / BP >120/80 X MI        X DM/A1c >6 / CBG >126       Pt Risk Score   1       Diagnosis Risk Score  95       Total Risk Score   96                        X High Risk                Low Risk    HT: 71.75" Ht Readings from Last 1 Encounters:  09/26/11 5' 11.75" (1.822 m)    WT:   214.5 lb (97.5 kg) Wt Readings from Last 3 Encounters:  09/26/11 214 lb 15.2 oz (97.5 kg)  09/25/11 214 lb (97.07 kg)  09/04/11 210 lb 12.8 oz (95.618 kg)     IBW 80.2 122%IBW BMI  29.4 31.1%body fat  Meds reviewed: Vitamin D, Lantus 65 units q bedtime, 12 units Novolog  AC TID Past Medical History  Diagnosis Date  . Myocardial infarction   . Ventricular tachycardia   . Diabetes mellitus   . COPD (chronic obstructive pulmonary disease)   . CAD (coronary artery disease) Dec 2012    s/p CABG x 3 with AVR; has normal EF  . Aortic stenosis Dec 2012    s/p AVR  . S/P ICD (internal cardiac defibrillator) procedure   . PTSD (post-traumatic stress disorder)   . HTN (hypertension)   . Tobacco abuse        Activity level: Pt is sedentary   Wt goal: 190-202 lb ( 86.4-91.8 kg) Current tobacco use? No     Pt quit tobacco use on 07/10/11 Food/Drug Interaction? No       Labs:  Lipid Panel  No results found for this basename: chol, trig, hdl, cholhdl, vldl, ldlcalc   Lab Results  Component Value Date   HGBA1C 10.7* 07/17/2011   09/04/11 Glucose 259  LDL goal: < 70      DM and > 2:      HTN, > 66 yo male Estimated Daily Nutrition Needs for: ? wt loss  1500-2000 Kcal , Total Fat 40-55gm, Saturated Fat 11-15 gm, Trans Fat 1.5-2.0 gm,  Sodium less than 1500 mg, Grams of CHO 175-250

## 2011-10-04 ENCOUNTER — Encounter (HOSPITAL_COMMUNITY)
Admission: RE | Admit: 2011-10-04 | Discharge: 2011-10-04 | Disposition: A | Discharge status: Home / Self Care | Payer: Medicare Other | Source: Ambulatory Visit | Attending: Cardiovascular Disease | Admitting: Cardiovascular Disease

## 2011-10-04 LAB — GLUCOSE, CAPILLARY: Glucose-Capillary: 199 mg/dL — ABNORMAL HIGH (ref 70–99)

## 2011-10-04 NOTE — Progress Notes (Signed)
Cardiac Rehab 1115 Pt started cardiac rehab today.  Pt tolerated light exercise without difficulty.  Monitor shows sr with first degree block, with st depression and occ PVC and rare couplet.  The ectopy is noted on his 12 lead ekg and pt has a ICD.  Will continue to monitor. Pt brought in his home glucose meter to check against the glucose meter here in rehab.  Pt attempted to scroll through the memory to show his past readings, however the meter was missing some of his readings.  His pre exercise blood sugar on the rehab meter was 189.  Several attempts to use his home meter yielded a result of 119.  Pt reports that his meter runs slowly, tries by rehab RN were difficult for the drop to be applied to the strip.  Pt questioned about controls.  Pt states that he does not do the control and had not changed the battery since he got the machine about a year ago.  [facility]  Pt plans to call and request another meter due to malfunction of the meter.

## 2011-10-07 ENCOUNTER — Encounter (HOSPITAL_COMMUNITY)
Admission: RE | Admit: 2011-10-07 | Discharge: 2011-10-07 | Disposition: A | Discharge status: Home / Self Care | Payer: Medicare Other | Source: Ambulatory Visit | Attending: Cardiovascular Disease | Admitting: Cardiovascular Disease

## 2011-10-07 LAB — GLUCOSE, CAPILLARY: Glucose-Capillary: 166 mg/dL — ABNORMAL HIGH (ref 70–99)

## 2011-10-09 ENCOUNTER — Encounter (HOSPITAL_COMMUNITY): Payer: Medicare Other

## 2011-10-11 ENCOUNTER — Encounter (HOSPITAL_COMMUNITY): Payer: Medicare Other

## 2011-10-14 ENCOUNTER — Encounter (HOSPITAL_COMMUNITY)
Admission: RE | Admit: 2011-10-14 | Discharge: 2011-10-14 | Disposition: A | Discharge status: Home / Self Care | Payer: Medicare Other | Source: Ambulatory Visit | Attending: Cardiovascular Disease | Admitting: Cardiovascular Disease

## 2011-10-14 LAB — GLUCOSE, CAPILLARY: Glucose-Capillary: 126 mg/dL — ABNORMAL HIGH (ref 70–99)

## 2011-10-14 NOTE — Progress Notes (Signed)
Reviewed home exercise with pt today.  Pt plans to walk and use treadmill at home for exercise.  Reviewed THR, pulse (needs practice), RPE, sign and symptoms, and when to call 911 or MD.  Pt voiced understanding. Fabio Pierce, MA, ACSM RCEP

## 2011-10-16 ENCOUNTER — Encounter (HOSPITAL_COMMUNITY)
Admission: RE | Admit: 2011-10-16 | Discharge: 2011-10-16 | Disposition: A | Discharge status: Home / Self Care | Payer: Medicare Other | Source: Ambulatory Visit | Attending: Cardiovascular Disease | Admitting: Cardiovascular Disease

## 2011-10-16 LAB — GLUCOSE, CAPILLARY: Glucose-Capillary: 268 mg/dL — ABNORMAL HIGH (ref 70–99)

## 2011-10-18 ENCOUNTER — Encounter (HOSPITAL_COMMUNITY)
Admission: RE | Admit: 2011-10-18 | Discharge: 2011-10-18 | Disposition: A | Discharge status: Home / Self Care | Payer: Non-veteran care | Source: Ambulatory Visit | Attending: Cardiovascular Disease | Admitting: Cardiovascular Disease

## 2011-10-18 ENCOUNTER — Encounter (HOSPITAL_COMMUNITY): Payer: Non-veteran care

## 2011-10-18 DIAGNOSIS — Z95810 Presence of automatic (implantable) cardiac defibrillator: Secondary | ICD-10-CM | POA: Insufficient documentation

## 2011-10-18 DIAGNOSIS — I359 Nonrheumatic aortic valve disorder, unspecified: Secondary | ICD-10-CM | POA: Insufficient documentation

## 2011-10-18 DIAGNOSIS — J449 Chronic obstructive pulmonary disease, unspecified: Secondary | ICD-10-CM | POA: Insufficient documentation

## 2011-10-18 DIAGNOSIS — E7800 Pure hypercholesterolemia, unspecified: Secondary | ICD-10-CM | POA: Insufficient documentation

## 2011-10-18 DIAGNOSIS — I4901 Ventricular fibrillation: Secondary | ICD-10-CM | POA: Insufficient documentation

## 2011-10-18 DIAGNOSIS — Z951 Presence of aortocoronary bypass graft: Secondary | ICD-10-CM | POA: Insufficient documentation

## 2011-10-18 DIAGNOSIS — E119 Type 2 diabetes mellitus without complications: Secondary | ICD-10-CM | POA: Insufficient documentation

## 2011-10-18 DIAGNOSIS — I4891 Unspecified atrial fibrillation: Secondary | ICD-10-CM | POA: Insufficient documentation

## 2011-10-18 DIAGNOSIS — I723 Aneurysm of iliac artery: Secondary | ICD-10-CM | POA: Insufficient documentation

## 2011-10-18 DIAGNOSIS — I252 Old myocardial infarction: Secondary | ICD-10-CM | POA: Insufficient documentation

## 2011-10-18 DIAGNOSIS — Z886 Allergy status to analgesic agent status: Secondary | ICD-10-CM | POA: Insufficient documentation

## 2011-10-18 DIAGNOSIS — F17200 Nicotine dependence, unspecified, uncomplicated: Secondary | ICD-10-CM | POA: Insufficient documentation

## 2011-10-18 DIAGNOSIS — I2510 Atherosclerotic heart disease of native coronary artery without angina pectoris: Secondary | ICD-10-CM | POA: Insufficient documentation

## 2011-10-18 DIAGNOSIS — Z5189 Encounter for other specified aftercare: Secondary | ICD-10-CM | POA: Insufficient documentation

## 2011-10-18 DIAGNOSIS — I509 Heart failure, unspecified: Secondary | ICD-10-CM | POA: Insufficient documentation

## 2011-10-18 DIAGNOSIS — J4489 Other specified chronic obstructive pulmonary disease: Secondary | ICD-10-CM | POA: Insufficient documentation

## 2011-10-18 DIAGNOSIS — Z9889 Other specified postprocedural states: Secondary | ICD-10-CM | POA: Insufficient documentation

## 2011-10-18 LAB — GLUCOSE, CAPILLARY: Glucose-Capillary: 120 mg/dL — ABNORMAL HIGH (ref 70–99)

## 2011-10-21 ENCOUNTER — Encounter (HOSPITAL_COMMUNITY)
Admission: RE | Admit: 2011-10-21 | Discharge: 2011-10-21 | Disposition: A | Discharge status: Home / Self Care | Payer: Non-veteran care | Source: Ambulatory Visit | Attending: Cardiovascular Disease | Admitting: Cardiovascular Disease

## 2011-10-21 LAB — GLUCOSE, CAPILLARY: Glucose-Capillary: 281 mg/dL — ABNORMAL HIGH (ref 70–99)

## 2011-10-22 ENCOUNTER — Telehealth: Payer: Self-pay | Admitting: *Deleted

## 2011-10-22 NOTE — Telephone Encounter (Signed)
NEEDS TO RESCHEDULE ECHO TEST HE CANCELLED, MSG LEFT AND NUMBER PROVIDED.

## 2011-10-23 ENCOUNTER — Encounter (HOSPITAL_COMMUNITY)
Admission: RE | Admit: 2011-10-23 | Discharge: 2011-10-23 | Disposition: A | Discharge status: Home / Self Care | Payer: Non-veteran care | Source: Ambulatory Visit | Attending: Cardiovascular Disease | Admitting: Cardiovascular Disease

## 2011-10-23 LAB — GLUCOSE, CAPILLARY: Glucose-Capillary: 301 mg/dL — ABNORMAL HIGH (ref 70–99)

## 2011-10-23 NOTE — Progress Notes (Signed)
Cardiac Rehab 1130 - Pt noted on the monitor showed afib  Which is new for patient.  Heart rate 110's and is asymptomatic.  Pt bp 114/60. Pt placed in a seated position.  Biomedical scientist paged.  Trish returned call immediately instructed to call Noralyn Pick Pa.  Paged pa on call.  While awaiting return page, pt converted to sinus rhythm with significant first degree block (pr interval .26-.28) which differed from EKG prior exercise sessions.  Instructed to fax strips to Norma Fredrickson for review.  Called office as fax was transmitted.  Office staff to hand deliver strips to [place] for review.  Pt to be notified by office off plan of care.

## 2011-10-24 ENCOUNTER — Telehealth: Payer: Self-pay | Admitting: *Deleted

## 2011-10-24 NOTE — Telephone Encounter (Signed)
[facility]  IS CHECKING ICD// [facility]//

## 2011-10-25 ENCOUNTER — Encounter (HOSPITAL_COMMUNITY)
Admission: RE | Admit: 2011-10-25 | Discharge: 2011-10-25 | Disposition: A | Discharge status: Home / Self Care | Payer: Non-veteran care | Source: Ambulatory Visit | Attending: Cardiovascular Disease | Admitting: Cardiovascular Disease

## 2011-10-25 NOTE — Progress Notes (Signed)
Pt pre blood sugar 318.  Negative ketones .  Pt advised to drink h20 and continue to monitor his blood sugar at home.  Pt unable to exercise due to rehab protocol. Consult by Mickle Plumb, dietician. Please see dietician consult note for detailed information.

## 2011-10-25 NOTE — Progress Notes (Signed)
Sean [name] 66 y.o. male Nutrition Note Spoke with pt.  Nutrition Plan and Nutrition Survey reviewed with pt. Pt is not following the Therapeutic Lifestyle Changes diet. Weight loss tips discussed. Pt expressed understanding. Pt is diabetic.  Diabetes Education test results reviewed. Pt CBG's have been elevated recently.  Pt ? Elevated CBG's may be due, in part, to stress given pt's sister-in-law hospitalized for 18+ days. Pt feels elevated CBG's due to "eating more now that I stopped smoking." Alternatives to eating discussed. Pt expressed understanding. Nutrition Diagnosis   Food-and nutrition-related knowledge deficit related to lack of exposure to information as related to diagnosis of: ? CVD ? DM (A1c 10.7)   Overweightrelated to excessive energy intake as evidenced by a BMI of 29.4 Nutrition RX/ Estimated Daily Nutrition Needs for: wt maintenance due to tobacco cessation 2500-2700 Kcal, 80-85 gm fat, 19-21 gm sat fat, 2.7-3.0 gm trans-fat, <1500 mg sodium, 325-425 gm CHO  Nutrition Intervention   Pt's individual nutrition plan including cholesterol goals reviewed with pt.   Benefits of adopting Therapeutic Lifestyle Changes discussed when Medficts reviewed.   Pt to attend the Portion Distortion class - met, 10/23/11   Pt to attend the ? Nutrition I class                         ? Nutrition II class        ? Diabetes Blitz class         ? Diabetes Q & A class - met, 10/25/11   Pt given handouts for: ? tobacco cessation and food ? DM   Continue client-centered nutrition education by RD, as part of interdisciplinary care. Goal(s)   Pt to identify and limit food sources of saturated fat, trans fat, and cholesterol   CBG concentrations in the normal range or as close to normal as is safely possible. Monitor and Evaluate progress toward nutrition goal with team.

## 2011-10-28 ENCOUNTER — Encounter (HOSPITAL_COMMUNITY)
Admission: RE | Admit: 2011-10-28 | Discharge: 2011-10-28 | Disposition: A | Discharge status: Home / Self Care | Payer: Non-veteran care | Source: Ambulatory Visit | Attending: Cardiovascular Disease | Admitting: Cardiovascular Disease

## 2011-10-28 LAB — GLUCOSE, CAPILLARY: Glucose-Capillary: 116 mg/dL — ABNORMAL HIGH (ref 70–99)

## 2011-10-30 ENCOUNTER — Telehealth: Payer: Self-pay | Admitting: *Deleted

## 2011-10-30 ENCOUNTER — Encounter (HOSPITAL_COMMUNITY)
Admission: RE | Admit: 2011-10-30 | Discharge: 2011-10-30 | Disposition: A | Discharge status: Home / Self Care | Payer: Non-veteran care | Source: Ambulatory Visit | Attending: Cardiovascular Disease | Admitting: Cardiovascular Disease

## 2011-10-30 NOTE — Telephone Encounter (Signed)
Dr nahser/ RBS being sent to PCP but will continue to come to you.  Arna Medici, RN - RE: Blood sugar readings?? More Detail >>      RE: Blood sugar readings??      Carlette Horald Chestnut, RN        Sent: Wed October 23, 2011  2:33 PM    To: Antony Odea, RN        Sean [name]    MRN: [id number] DOB: May 08, 1946     Pt Home: [phone]               Message       I checked with point of care (they are responsible for blood glucose testing) because Dr. Elease Hashimoto is listed as the attending for cardiac rehab any "labs" generate a report and then are a sent to the attending MD.  Sorry for that it is a "safety"built in for epic for physicians to know about any labs for their patients. Hope this helps! carlette ----- Message -----    From: Sable Feil, RN    Sent: 10/23/2011  12:16 PM      To: Arna Medici, RN Subject: RE: Blood sugar readings??                     Hi Carlette,  Thanks for getting back to me. Look in the pts chart under labs. All the glucose readings come to Dr Elease Hashimoto //marked hospital. I explained to him that the [place] dr is responsible for them, he understands but just wondered why these all come to him. Seems like a glitch in Epic. Unsure what can be done to change this. Thanks for helping to address this. Jodette  ----- Message -----    From: Arna Medici, RN    Sent: 10/23/2011   8:36 AM      To: Sable Feil, RN Subject: Blood sugar readings??                         Hi Jodette  I got your message from yesterday.  Tuesdays are an off day for me.  I need some information from you.  I haven't sent any readings to Dr. Elease Hashimoto to review.  How are these readings being sent?  I wonder if this is an epic and point of a care automatic delivery to the attending md listed for Cardiac Rehab.  I do check his blood sugar prior to exercise but none of the readings required any further intervention except for one ( over 300) pt  not allowed to exercise per our protocol.  [facility] and sent a FYI note  to Norma Fredrickson (since she saw him last in the office)  Thanks for helping me understand better where the reports are generated that Dr. Elease Hashimoto is receiving.  Carlette

## 2011-11-01 ENCOUNTER — Encounter (HOSPITAL_COMMUNITY)
Admission: RE | Admit: 2011-11-01 | Discharge: 2011-11-01 | Disposition: A | Discharge status: Home / Self Care | Payer: Non-veteran care | Source: Ambulatory Visit | Attending: Cardiovascular Disease | Admitting: Cardiovascular Disease

## 2011-11-04 ENCOUNTER — Encounter (HOSPITAL_COMMUNITY): Payer: Non-veteran care

## 2011-11-04 ENCOUNTER — Encounter (HOSPITAL_COMMUNITY)
Admission: RE | Admit: 2011-11-04 | Discharge: 2011-11-04 | Disposition: A | Discharge status: Home / Self Care | Payer: Non-veteran care | Source: Ambulatory Visit | Attending: Cardiovascular Disease | Admitting: Cardiovascular Disease

## 2011-11-04 LAB — GLUCOSE, CAPILLARY
Glucose-Capillary: 338 mg/dL — ABNORMAL HIGH (ref 70–99)
Glucose-Capillary: 227 mg/dL — ABNORMAL HIGH (ref 70–99)

## 2011-11-04 NOTE — Progress Notes (Signed)
Pt pre exercise blood sugar checked 338.  Pt surprised that his reading was that high.  Pt took his blood sugar prior to leaving for rehab 178.  Pt ate an apple on the way in to exercise.  Pt requested a recheck.  Second reading 282. Pt struggles with understanding food selection and how that correlates with his blood sugar readings.  Continue to reinforce management of his diabetes.

## 2011-11-06 ENCOUNTER — Encounter (HOSPITAL_COMMUNITY): Payer: Non-veteran care

## 2011-11-08 ENCOUNTER — Encounter (HOSPITAL_COMMUNITY)
Admission: RE | Admit: 2011-11-08 | Discharge: 2011-11-08 | Disposition: A | Discharge status: Home / Self Care | Payer: Non-veteran care | Source: Ambulatory Visit | Attending: Cardiovascular Disease | Admitting: Cardiovascular Disease

## 2011-11-08 LAB — GLUCOSE, CAPILLARY: Glucose-Capillary: 241 mg/dL — ABNORMAL HIGH (ref 70–99)

## 2011-11-11 ENCOUNTER — Encounter (HOSPITAL_COMMUNITY)
Admission: RE | Admit: 2011-11-11 | Discharge: 2011-11-11 | Disposition: A | Discharge status: Home / Self Care | Payer: Non-veteran care | Source: Ambulatory Visit | Attending: Cardiovascular Disease | Admitting: Cardiovascular Disease

## 2011-11-11 LAB — GLUCOSE, CAPILLARY: Glucose-Capillary: 184 mg/dL — ABNORMAL HIGH (ref 70–99)

## 2011-11-13 ENCOUNTER — Encounter (HOSPITAL_COMMUNITY)
Admission: RE | Admit: 2011-11-13 | Discharge: 2011-11-13 | Disposition: A | Discharge status: Home / Self Care | Payer: Non-veteran care | Source: Ambulatory Visit | Attending: Cardiovascular Disease | Admitting: Cardiovascular Disease

## 2011-11-15 ENCOUNTER — Encounter (HOSPITAL_COMMUNITY)
Admission: RE | Admit: 2011-11-15 | Discharge: 2011-11-15 | Disposition: A | Discharge status: Home / Self Care | Payer: Non-veteran care | Source: Ambulatory Visit | Attending: Cardiovascular Disease | Admitting: Cardiovascular Disease

## 2011-11-18 ENCOUNTER — Encounter (HOSPITAL_COMMUNITY): Payer: Non-veteran care

## 2011-11-18 LAB — GLUCOSE, CAPILLARY: Glucose-Capillary: 268 mg/dL — ABNORMAL HIGH (ref 70–99)

## 2011-11-20 ENCOUNTER — Encounter (HOSPITAL_COMMUNITY)
Admission: RE | Admit: 2011-11-20 | Discharge: 2011-11-20 | Disposition: A | Discharge status: Home / Self Care | Payer: Non-veteran care | Source: Ambulatory Visit | Attending: Cardiovascular Disease | Admitting: Cardiovascular Disease

## 2011-11-20 DIAGNOSIS — I4901 Ventricular fibrillation: Secondary | ICD-10-CM | POA: Insufficient documentation

## 2011-11-20 DIAGNOSIS — E7800 Pure hypercholesterolemia, unspecified: Secondary | ICD-10-CM | POA: Insufficient documentation

## 2011-11-20 DIAGNOSIS — E119 Type 2 diabetes mellitus without complications: Secondary | ICD-10-CM | POA: Insufficient documentation

## 2011-11-20 DIAGNOSIS — Z951 Presence of aortocoronary bypass graft: Secondary | ICD-10-CM | POA: Insufficient documentation

## 2011-11-20 DIAGNOSIS — I252 Old myocardial infarction: Secondary | ICD-10-CM | POA: Insufficient documentation

## 2011-11-20 DIAGNOSIS — I4891 Unspecified atrial fibrillation: Secondary | ICD-10-CM | POA: Insufficient documentation

## 2011-11-20 DIAGNOSIS — I359 Nonrheumatic aortic valve disorder, unspecified: Secondary | ICD-10-CM | POA: Insufficient documentation

## 2011-11-20 DIAGNOSIS — Z95810 Presence of automatic (implantable) cardiac defibrillator: Secondary | ICD-10-CM | POA: Insufficient documentation

## 2011-11-20 DIAGNOSIS — I723 Aneurysm of iliac artery: Secondary | ICD-10-CM | POA: Insufficient documentation

## 2011-11-20 DIAGNOSIS — I2510 Atherosclerotic heart disease of native coronary artery without angina pectoris: Secondary | ICD-10-CM | POA: Insufficient documentation

## 2011-11-20 DIAGNOSIS — Z5189 Encounter for other specified aftercare: Secondary | ICD-10-CM | POA: Insufficient documentation

## 2011-11-20 DIAGNOSIS — F17200 Nicotine dependence, unspecified, uncomplicated: Secondary | ICD-10-CM | POA: Insufficient documentation

## 2011-11-20 DIAGNOSIS — I509 Heart failure, unspecified: Secondary | ICD-10-CM | POA: Insufficient documentation

## 2011-11-20 DIAGNOSIS — Z9889 Other specified postprocedural states: Secondary | ICD-10-CM | POA: Insufficient documentation

## 2011-11-20 DIAGNOSIS — Z886 Allergy status to analgesic agent status: Secondary | ICD-10-CM | POA: Insufficient documentation

## 2011-11-20 DIAGNOSIS — J449 Chronic obstructive pulmonary disease, unspecified: Secondary | ICD-10-CM | POA: Insufficient documentation

## 2011-11-20 DIAGNOSIS — J4489 Other specified chronic obstructive pulmonary disease: Secondary | ICD-10-CM | POA: Insufficient documentation

## 2011-11-22 ENCOUNTER — Encounter (HOSPITAL_COMMUNITY)
Admission: RE | Admit: 2011-11-22 | Discharge: 2011-11-22 | Disposition: A | Discharge status: Home / Self Care | Payer: Non-veteran care | Source: Ambulatory Visit | Attending: Cardiovascular Disease | Admitting: Cardiovascular Disease

## 2011-11-25 ENCOUNTER — Encounter (HOSPITAL_COMMUNITY): Payer: Non-veteran care

## 2011-11-27 ENCOUNTER — Encounter (HOSPITAL_COMMUNITY): Payer: Non-veteran care

## 2011-11-29 ENCOUNTER — Encounter (HOSPITAL_COMMUNITY)
Admission: RE | Admit: 2011-11-29 | Discharge: 2011-11-29 | Disposition: A | Discharge status: Home / Self Care | Payer: Non-veteran care | Source: Ambulatory Visit | Attending: Cardiovascular Disease | Admitting: Cardiovascular Disease

## 2011-12-02 ENCOUNTER — Encounter (HOSPITAL_COMMUNITY): Payer: Non-veteran care

## 2011-12-04 ENCOUNTER — Encounter (HOSPITAL_COMMUNITY)
Admission: RE | Admit: 2011-12-04 | Discharge: 2011-12-04 | Disposition: A | Discharge status: Home / Self Care | Payer: Non-veteran care | Source: Ambulatory Visit | Attending: Cardiovascular Disease | Admitting: Cardiovascular Disease

## 2011-12-04 LAB — GLUCOSE, CAPILLARY: Glucose-Capillary: 212 mg/dL — ABNORMAL HIGH (ref 70–99)

## 2011-12-06 ENCOUNTER — Encounter (HOSPITAL_COMMUNITY)
Admission: RE | Admit: 2011-12-06 | Discharge: 2011-12-06 | Disposition: A | Discharge status: Home / Self Care | Payer: Non-veteran care | Source: Ambulatory Visit | Attending: Cardiovascular Disease | Admitting: Cardiovascular Disease

## 2011-12-09 ENCOUNTER — Encounter (HOSPITAL_COMMUNITY)
Admission: RE | Admit: 2011-12-09 | Discharge: 2011-12-09 | Disposition: A | Discharge status: Home / Self Care | Payer: Non-veteran care | Source: Ambulatory Visit | Attending: Cardiovascular Disease | Admitting: Cardiovascular Disease

## 2011-12-11 ENCOUNTER — Encounter (HOSPITAL_COMMUNITY): Payer: Non-veteran care

## 2011-12-13 ENCOUNTER — Encounter (HOSPITAL_COMMUNITY): Payer: Non-veteran care

## 2011-12-16 ENCOUNTER — Encounter (HOSPITAL_COMMUNITY): Payer: Non-veteran care

## 2011-12-18 ENCOUNTER — Encounter (HOSPITAL_COMMUNITY): Payer: Medicare Other

## 2011-12-20 ENCOUNTER — Encounter (HOSPITAL_COMMUNITY)
Admission: RE | Admit: 2011-12-20 | Discharge: 2011-12-20 | Disposition: A | Discharge status: Home / Self Care | Payer: Medicare Other | Source: Ambulatory Visit | Attending: Cardiovascular Disease | Admitting: Cardiovascular Disease

## 2011-12-20 DIAGNOSIS — J449 Chronic obstructive pulmonary disease, unspecified: Secondary | ICD-10-CM | POA: Insufficient documentation

## 2011-12-20 DIAGNOSIS — Z951 Presence of aortocoronary bypass graft: Secondary | ICD-10-CM | POA: Insufficient documentation

## 2011-12-20 DIAGNOSIS — F17200 Nicotine dependence, unspecified, uncomplicated: Secondary | ICD-10-CM | POA: Insufficient documentation

## 2011-12-20 DIAGNOSIS — E119 Type 2 diabetes mellitus without complications: Secondary | ICD-10-CM | POA: Insufficient documentation

## 2011-12-20 DIAGNOSIS — I4891 Unspecified atrial fibrillation: Secondary | ICD-10-CM | POA: Insufficient documentation

## 2011-12-20 DIAGNOSIS — E7800 Pure hypercholesterolemia, unspecified: Secondary | ICD-10-CM | POA: Insufficient documentation

## 2011-12-20 DIAGNOSIS — J4489 Other specified chronic obstructive pulmonary disease: Secondary | ICD-10-CM | POA: Insufficient documentation

## 2011-12-20 DIAGNOSIS — I252 Old myocardial infarction: Secondary | ICD-10-CM | POA: Insufficient documentation

## 2011-12-20 DIAGNOSIS — I723 Aneurysm of iliac artery: Secondary | ICD-10-CM | POA: Insufficient documentation

## 2011-12-20 DIAGNOSIS — I359 Nonrheumatic aortic valve disorder, unspecified: Secondary | ICD-10-CM | POA: Insufficient documentation

## 2011-12-20 DIAGNOSIS — Z95810 Presence of automatic (implantable) cardiac defibrillator: Secondary | ICD-10-CM | POA: Insufficient documentation

## 2011-12-20 DIAGNOSIS — Z9889 Other specified postprocedural states: Secondary | ICD-10-CM | POA: Insufficient documentation

## 2011-12-20 DIAGNOSIS — Z5189 Encounter for other specified aftercare: Secondary | ICD-10-CM | POA: Insufficient documentation

## 2011-12-20 DIAGNOSIS — I2510 Atherosclerotic heart disease of native coronary artery without angina pectoris: Secondary | ICD-10-CM | POA: Insufficient documentation

## 2011-12-20 DIAGNOSIS — I4901 Ventricular fibrillation: Secondary | ICD-10-CM | POA: Insufficient documentation

## 2011-12-20 DIAGNOSIS — I509 Heart failure, unspecified: Secondary | ICD-10-CM | POA: Insufficient documentation

## 2011-12-20 DIAGNOSIS — Z886 Allergy status to analgesic agent status: Secondary | ICD-10-CM | POA: Insufficient documentation

## 2011-12-20 LAB — GLUCOSE, CAPILLARY: Glucose-Capillary: 140 mg/dL — ABNORMAL HIGH (ref 70–99)

## 2011-12-20 NOTE — Progress Notes (Addendum)
Sean [name] 66 y.o. male Nutrition Note  Time Spent: 30 minutes Spoke with pt. Pt reports his most recent A1c was 12, which is up from 8. Per discussion with pt, I do not feel pt's A1c is elevated due to lack of nutrition/ diabetes education. I feel pt educated re: diabetic diet. Pt reports eating "all day long." Per pt, he will eat a meal and feel full and then proceed to eat something else even though he knows he is not hungry. Pt states he does not feel like he is "in control of his eating." Pt reports he previously drank alcohol as a coping mechanism and never thought of his tobacco use as a coping mechanism. This Clinical research associate ? Pt if he feels he is eating as a coping mechanism. Pt responded by saying "I think you may have something there." Pt has PTSD and saw a "shrink" this past Wed. Per pt, pt feels like his PTSD med is not working like it should, but he didn't tell his psychiatrist because "I don't want to take any more medicine." Pt states he does not like to discuss what happened in [place] because "it was a lifetime ago and if I start hearing people talk about what happened I get shaky and start having nightmares again." Suspect pt needs to develop a healthy coping mechanism in order to gain control over eating. Pt agreed with the plan to f/u with his Psychiatrist. Pt encouraged to ask his MD at the [facility] re: endocrinology consult vs. Medication change for tighter CBG control. Continue client-centered nutrition education by RD as part of interdisciplinary care.  Monitor and evaluate progress toward nutrition goal with team.  Intervention Pt to contact his Psychiatrist for an appt to discuss disordered eating/ developing healthy coping mechanism.  Feel pt needs an Endocrinology consult vs. Medication adjustment for tighter blood glucose control.  Karlene Lineman, RN notified of the above encounter.

## 2011-12-23 ENCOUNTER — Ambulatory Visit: Payer: Self-pay | Admitting: Cardiovascular Disease

## 2011-12-23 ENCOUNTER — Encounter (HOSPITAL_COMMUNITY): Payer: Medicare Other

## 2011-12-25 ENCOUNTER — Encounter (HOSPITAL_COMMUNITY): Payer: Medicare Other

## 2011-12-27 ENCOUNTER — Encounter (HOSPITAL_COMMUNITY): Payer: Medicare Other

## 2011-12-30 ENCOUNTER — Encounter (HOSPITAL_COMMUNITY)
Admission: RE | Admit: 2011-12-30 | Discharge: 2011-12-30 | Disposition: A | Discharge status: Home / Self Care | Payer: Medicare Other | Source: Ambulatory Visit | Attending: Cardiovascular Disease | Admitting: Cardiovascular Disease

## 2011-12-30 LAB — GLUCOSE, CAPILLARY: Glucose-Capillary: 288 mg/dL — ABNORMAL HIGH (ref 70–99)

## 2012-01-01 ENCOUNTER — Encounter (HOSPITAL_COMMUNITY)
Admission: RE | Admit: 2012-01-01 | Discharge: 2012-01-01 | Disposition: A | Discharge status: Home / Self Care | Payer: Medicare Other | Source: Ambulatory Visit | Attending: Cardiovascular Disease | Admitting: Cardiovascular Disease

## 2012-01-01 NOTE — Progress Notes (Signed)
Pt plans to graduate early today.  Pt plans to continue home exercise at the apartment complex where he resides fitness center.

## 2012-01-06 ENCOUNTER — Ambulatory Visit (HOSPITAL_COMMUNITY): Payer: Medicare Other

## 2012-01-08 ENCOUNTER — Ambulatory Visit (HOSPITAL_COMMUNITY): Payer: Medicare Other

## 2012-01-10 ENCOUNTER — Ambulatory Visit (HOSPITAL_COMMUNITY): Payer: Medicare Other

## 2012-01-15 ENCOUNTER — Ambulatory Visit (HOSPITAL_COMMUNITY): Payer: Medicare Other

## 2012-01-17 ENCOUNTER — Ambulatory Visit (HOSPITAL_COMMUNITY): Payer: Medicare Other

## 2012-01-20 ENCOUNTER — Ambulatory Visit (HOSPITAL_COMMUNITY): Payer: Medicare Other

## 2012-01-22 ENCOUNTER — Ambulatory Visit (HOSPITAL_COMMUNITY): Payer: Medicare Other

## 2012-01-24 ENCOUNTER — Ambulatory Visit (HOSPITAL_COMMUNITY): Payer: Medicare Other

## 2012-01-27 ENCOUNTER — Ambulatory Visit (HOSPITAL_COMMUNITY): Payer: Medicare Other

## 2012-01-29 ENCOUNTER — Ambulatory Visit (HOSPITAL_COMMUNITY): Payer: Medicare Other

## 2012-01-31 ENCOUNTER — Ambulatory Visit (HOSPITAL_COMMUNITY): Payer: Medicare Other

## 2012-02-10 ENCOUNTER — Ambulatory Visit (INDEPENDENT_AMBULATORY_CARE_PROVIDER_SITE_OTHER): Payer: Medicare Other | Admitting: Cardiovascular Disease

## 2012-02-10 ENCOUNTER — Encounter: Payer: Self-pay | Admitting: Cardiovascular Disease

## 2012-02-10 VITALS — BP 123/73 | HR 67 | Ht 72.0 in | Wt 222.8 lb

## 2012-02-10 DIAGNOSIS — E785 Hyperlipidemia, unspecified: Secondary | ICD-10-CM

## 2012-02-10 DIAGNOSIS — Z952 Presence of prosthetic heart valve: Secondary | ICD-10-CM

## 2012-02-10 DIAGNOSIS — E119 Type 2 diabetes mellitus without complications: Secondary | ICD-10-CM

## 2012-02-10 DIAGNOSIS — I2510 Atherosclerotic heart disease of native coronary artery without angina pectoris: Secondary | ICD-10-CM

## 2012-02-10 DIAGNOSIS — I10 Essential (primary) hypertension: Secondary | ICD-10-CM

## 2012-02-10 DIAGNOSIS — Z951 Presence of aortocoronary bypass graft: Secondary | ICD-10-CM

## 2012-02-10 DIAGNOSIS — Z954 Presence of other heart-valve replacement: Secondary | ICD-10-CM

## 2012-02-10 DIAGNOSIS — I4891 Unspecified atrial fibrillation: Secondary | ICD-10-CM

## 2012-02-10 LAB — BASIC METABOLIC PANEL
BUN: 15 mg/dL (ref 6–23)
Creatinine, Ser: 1 mg/dL (ref 0.4–1.5)
GFR: 80.92 mL/min (ref >60.00)
Potassium: 3.8 mEq/L (ref 3.5–5.1)

## 2012-02-10 LAB — HEPATIC FUNCTION PANEL
ALT: 27 U/L (ref 0–53)
Alkaline Phosphatase: 57 U/L (ref 39–117)
Bilirubin, Direct: 0 mg/dL (ref 0.0–0.3)
Total Protein: 7 g/dL (ref 6.0–8.3)

## 2012-02-10 LAB — LIPID PANEL: Cholesterol: 158 mg/dL (ref 0–200)

## 2012-02-10 NOTE — Progress Notes (Signed)
Sean [name] Date of Birth  1945/12/30       [facility] Office [address]  [address] [place], [place]  [address]   [place], [place]  [address] [phone]     [phone]   Fax  [phone]    Fax [phone]  Problem List:   History of Present Illness:  Sean [name]. not is doing very well. He status post coronary artery bypass grafting and aortic valve replacement in December. He is participating in cardiac rehabilitation. He's not had any episodes of chest pain or shortness of breath.  He stopped smoking in November. He's gained a little bit of weight since that time.  He's been trying to work on a good diet and exercise program.  Current Outpatient Prescriptions on File Prior to Visit  Medication Sig Dispense Refill  . albuterol (PROVENTIL HFA;VENTOLIN HFA) 108 (90 BASE) MCG/ACT inhaler Inhale 2 puffs into the lungs every 6 (six) hours as needed.        Marland Kitchen amoxicillin (AMOXIL) 500 MG capsule Take 4 caps (2mg ) 30 to 60 minutes prior to dental procedure  20 capsule  0  . aspirin 81 MG tablet Take 81 mg by mouth daily.       Marland Kitchen atenolol (TENORMIN) 25 MG tablet Take 1 tablet (25 mg total) by mouth daily.  30 tablet  1  . atorvastatin (LIPITOR) 80 MG tablet Take 40 mg by mouth daily.        . budesonide-formoterol (SYMBICORT) 160-4.5 MCG/ACT inhaler Inhale 2 puffs into the lungs as needed.       . cholecalciferol (D-VI-SOL) 400 UNIT/ML LIQD Take 400 Units by mouth daily.        . flunisolide (NASALIDE) 0.025 % SOLN Inhale 2 sprays into the lungs 2 (two) times daily.        Marland Kitchen gabapentin (NEURONTIN) 600 MG tablet Take 600 mg by mouth 3 (three) times daily.        . insulin aspart (NOVOLOG) 100 UNIT/ML injection Inject 12 Units into the skin 3 (three) times daily before meals.       . insulin glargine (LANTUS) 100 UNIT/ML injection Inject 65 Units into the skin at bedtime.  10 mL  1  . lisinopril (PRINIVIL,ZESTRIL) 10 MG tablet Take 1  tablet (10 mg total) by mouth daily.  30 tablet  11  . PARoxetine (PAXIL) 20 MG tablet Take 10 mg by mouth every morning.      . polysaccharide iron (NIFEREX) 150 MG CAPS capsule Take 1 capsule (150 mg total) by mouth daily.  30 each  1    Allergies  Allergen Reactions  . Procaine Hcl Other (See Comments)    unknown  . Rosuvastatin Other (See Comments)    Muscle pain    Past Medical History  Diagnosis Date  . Myocardial infarction   . Ventricular tachycardia   . Diabetes mellitus   . COPD (chronic obstructive pulmonary disease)   . CAD (coronary artery disease) Dec 2012    s/p CABG x 3 with AVR; has normal EF  . Aortic stenosis Dec 2012    s/p AVR  . S/P ICD (internal cardiac defibrillator) procedure   . PTSD (post-traumatic stress disorder)   . HTN (hypertension)   . Tobacco abuse     Past Surgical History  Procedure Date  . Icd implant   . Nose surgery     skin cancer  . Cardiac catheterization   .  Coronary angioplasty   . Tee without cardioversion 07/22/2011    Procedure: TRANSESOPHAGEAL ECHOCARDIOGRAM (TEE);  Surgeon: Lewayne Bunting, MD;  Location: [facility] ENDOSCOPY;  Service: Cardiovascular;  Laterality: N/A;  . Coronary artery bypass graft 07/23/2011    Procedure: CORONARY ARTERY BYPASS GRAFTING (CABG);  Surgeon: Purcell Nails, MD;  Location: [facility] OR;  Service: Open Heart Surgery;  Laterality: N/A;  CABG x 3 using bilateral greater saphenous veins harvested endoscopically  . Aortic valve replacement 07/23/2011    Procedure: AORTIC VALVE REPLACEMENT (AVR);  Surgeon: Purcell Nails, MD;  Location: [facility] OR;  Service: Open Heart Surgery;  Laterality: N/A;    History  Smoking status  . Former Smoker -- 1.0 packs/day  . Quit date: 07/16/2011  Smokeless tobacco  . Not on file    History  Alcohol Use No    History reviewed. No pertinent family history.  Reviw of Systems:  Reviewed in the HPI.  All other systems are negative.  Physical Exam: Blood pressure 123/73,  pulse 67, height 6' (1.829 m), weight 222 lb 12.8 oz (101.061 kg). General: Well developed, well nourished, in no acute distress.  Head: Normocephalic, atraumatic, sclera non-icteric, mucus membranes are moist,   Neck: Supple. Carotids are 2 + without bruits. No JVD  Lungs: Clear bilaterally to auscultation.  Heart: regular rate.  normal  S1 S2. No murmurs, gallops or rubs.  His sternotomy is healing well. Abdomen: Soft, non-tender, non-distended with normal bowel sounds. No hepatomegaly. No rebound/guarding. No masses.  Msk:  Strength and tone are normal  Extremities: No clubbing or cyanosis. No edema.  Distal pedal pulses are 2+ and equal bilaterally.  Neuro: Alert and oriented X 3. Moves all extremities spontaneously.  Psych:  Responds to questions appropriately with a normal affect.  ECG:  Assessment / Plan:

## 2012-02-10 NOTE — Assessment & Plan Note (Signed)
Pt is doing well.  He denies any chest pain.  Continue current meds.

## 2012-02-10 NOTE — Patient Instructions (Addendum)
Your physician recommends that you return for a FASTING lipid profile: today   Your physician wants you to follow-up in: 6 months  You will receive a reminder letter in the mail two months in advance. If you don't receive a letter, please call our office to schedule the follow-up appointment.   

## 2012-02-10 NOTE — Assessment & Plan Note (Signed)
Remains in normal sinus rhythm. 

## 2012-02-10 NOTE — Assessment & Plan Note (Signed)
Sean [name] is doing very well following his aortic valve replacement.

## 2012-02-11 ENCOUNTER — Telehealth: Payer: Self-pay | Admitting: *Deleted

## 2012-02-11 NOTE — Telephone Encounter (Signed)
Message copied by Antony Odea on Tue Feb 11, 2012  2:47 PM ------      Message from: Vesta Mixer      Created: Mon Feb 10, 2012  6:26 PM       His glucose levels remain elevated. Please forward these to his medical Dr.

## 2012-02-11 NOTE — Telephone Encounter (Signed)
msg left, will forward to pcp, asked him to call back with questions

## 2012-04-24 ENCOUNTER — Emergency Department (HOSPITAL_COMMUNITY): Payer: Medicare Other

## 2012-04-24 ENCOUNTER — Encounter (HOSPITAL_COMMUNITY): Payer: Self-pay

## 2012-04-24 ENCOUNTER — Other Ambulatory Visit: Payer: Self-pay

## 2012-04-24 ENCOUNTER — Emergency Department (HOSPITAL_COMMUNITY)
Admission: EM | Admit: 2012-04-24 | Discharge: 2012-04-24 | Disposition: A | Discharge status: Home / Self Care | Payer: Medicare Other | Attending: Emergency Medicine | Admitting: Emergency Medicine

## 2012-04-24 DIAGNOSIS — Z79899 Other long term (current) drug therapy: Secondary | ICD-10-CM | POA: Insufficient documentation

## 2012-04-24 DIAGNOSIS — Z794 Long term (current) use of insulin: Secondary | ICD-10-CM | POA: Insufficient documentation

## 2012-04-24 DIAGNOSIS — S32000A Wedge compression fracture of unspecified lumbar vertebra, initial encounter for closed fracture: Secondary | ICD-10-CM | POA: Diagnosis present

## 2012-04-24 DIAGNOSIS — J4489 Other specified chronic obstructive pulmonary disease: Secondary | ICD-10-CM | POA: Insufficient documentation

## 2012-04-24 DIAGNOSIS — I10 Essential (primary) hypertension: Secondary | ICD-10-CM | POA: Insufficient documentation

## 2012-04-24 DIAGNOSIS — Z951 Presence of aortocoronary bypass graft: Secondary | ICD-10-CM | POA: Insufficient documentation

## 2012-04-24 DIAGNOSIS — Z7982 Long term (current) use of aspirin: Secondary | ICD-10-CM | POA: Insufficient documentation

## 2012-04-24 DIAGNOSIS — S32009A Unspecified fracture of unspecified lumbar vertebra, initial encounter for closed fracture: Secondary | ICD-10-CM | POA: Insufficient documentation

## 2012-04-24 DIAGNOSIS — I252 Old myocardial infarction: Secondary | ICD-10-CM | POA: Insufficient documentation

## 2012-04-24 DIAGNOSIS — W1789XA Other fall from one level to another, initial encounter: Secondary | ICD-10-CM | POA: Insufficient documentation

## 2012-04-24 DIAGNOSIS — E119 Type 2 diabetes mellitus without complications: Secondary | ICD-10-CM | POA: Insufficient documentation

## 2012-04-24 DIAGNOSIS — W19XXXA Unspecified fall, initial encounter: Secondary | ICD-10-CM | POA: Diagnosis present

## 2012-04-24 DIAGNOSIS — J449 Chronic obstructive pulmonary disease, unspecified: Secondary | ICD-10-CM | POA: Insufficient documentation

## 2012-04-24 DIAGNOSIS — Z87891 Personal history of nicotine dependence: Secondary | ICD-10-CM | POA: Insufficient documentation

## 2012-04-24 DIAGNOSIS — I2510 Atherosclerotic heart disease of native coronary artery without angina pectoris: Secondary | ICD-10-CM | POA: Insufficient documentation

## 2012-04-24 MED ORDER — SODIUM CHLORIDE 0.9 % IV SOLN
Freq: Once | INTRAVENOUS | Status: AC
  Administered 2012-04-24: 17:00:00 via INTRAVENOUS

## 2012-04-24 MED ORDER — HYDROMORPHONE HCL PF 1 MG/ML IJ SOLN
0.5000 mg | Freq: Once | INTRAMUSCULAR | Status: AC
Start: 2012-04-24 — End: 2012-04-24
  Administered 2012-04-24: 0.5 mg via INTRAVENOUS
  Filled 2012-04-24: qty 1

## 2012-04-24 MED ORDER — IBUPROFEN 800 MG PO TABS
800.0000 mg | ORAL_TABLET | Freq: Once | ORAL | Status: AC
  Administered 2012-04-24: 800 mg via ORAL
  Filled 2012-04-24: qty 1

## 2012-04-24 MED ORDER — TETANUS-DIPHTH-ACELL PERTUSSIS 5-2.5-18.5 LF-MCG/0.5 IM SUSP
0.5000 mL | Freq: Once | INTRAMUSCULAR | Status: AC
  Administered 2012-04-24: 0.5 mL via INTRAMUSCULAR
  Filled 2012-04-24: qty 0.5

## 2012-04-24 MED ORDER — HYDROMORPHONE HCL PF 1 MG/ML IJ SOLN
1.0000 mg | Freq: Once | INTRAMUSCULAR | Status: AC
  Administered 2012-04-24: 1 mg via INTRAVENOUS
  Filled 2012-04-24: qty 1

## 2012-04-24 MED ORDER — HYDROCODONE-ACETAMINOPHEN 5-500 MG PO TABS: 1.0000 | ORAL_TABLET | Freq: Four times a day (QID) | ORAL | Status: AC | PRN

## 2012-04-24 MED ORDER — HYDROMORPHONE HCL PF 1 MG/ML IJ SOLN
0.5000 mg | Freq: Once | INTRAMUSCULAR | Status: AC
  Administered 2012-04-24: 0.5 mg via INTRAVENOUS
  Filled 2012-04-24: qty 1

## 2012-04-24 NOTE — ED Notes (Signed)
Pt fell from tree stand on to back approximately 8-10 feet. Walked to house called ems. Pt refused immobilization

## 2012-04-24 NOTE — ED Provider Notes (Signed)
I saw and evaluated the patient, reviewed the resident's note and I agree with the findings and plan.  EKG:  Rhythm: Ventricularly paced Rate: 100 Axis: Left Intervals: V. paced ST segments: Nonspecific ST changes  66 year old gentleman presenting with lower back pain. He fell approximately 15-16 feet straight on his back from a tree stand. No loss of consciousness. Patient was laboratory after the accident walked out of the woods. Is complaining of pain in his lower back in the right side of his neck. He denies numbness tingling or loss of strength. No chest pain or shortness of breath. No abdominal pain. No acute visual changes. No confusion per family at bedside. Patient denies use of blood thinning medication aside from 81 mg of aspirin daily. On exam he's got some mild diffuse tenderness across his lower back including the midline. Is no midline cervical spinal tenderness. He does have some right lateral neck tenderness though. CN 2 through 12 are intact. Strength is 5 out of 5 bilateral upper lower extremities. Sensation is intact to light touch. Lungs are clear bilaterally with symmetric chest rise. Abdomen is nontender and nondistended.  Superficial abrasions to his upper extremities, but no bony tenderness. Very low suspicion for fx. No symptoms or exam findings to suggest spinal cord injury. If imaging negative feel that can be discharged with PRN pain meds and strict return precautions.   Raeford Razor, MD 04/24/12 1759

## 2012-04-24 NOTE — ED Provider Notes (Signed)
History     CSN: [id number]  Arrival date & time 04/24/12  1638   First MD Initiated Contact with Patient 04/24/12 1646      Chief Complaint  Patient presents with  . Fall    (Consider location/radiation/quality/duration/timing/severity/associated sxs/prior treatment) Patient is a 66 y.o. male presenting with fall. The history is provided by the patient.  Fall The accident occurred less than 1 hour ago. Incident: from a tree stand. He fell from a height of 16 to 20 ft. He landed on grass. The volume of blood lost was minimal. Point of impact: back. Pain location: low back. The pain is at a severity of 6/10. The pain is mild. He was ambulatory at the scene. There was no entrapment after the fall. Pertinent negatives include no fever, no numbness, no abdominal pain, no nausea, no vomiting, no hematuria and no headaches. The symptoms are aggravated by activity. He has tried nothing for the symptoms. The treatment provided no relief.    Past Medical History  Diagnosis Date  . Myocardial infarction   . Ventricular tachycardia   . Diabetes mellitus   . COPD (chronic obstructive pulmonary disease)   . CAD (coronary artery disease) Dec 2012    s/p CABG x 3 with AVR; has normal EF  . Aortic stenosis Dec 2012    s/p AVR  . S/P ICD (internal cardiac defibrillator) procedure   . PTSD (post-traumatic stress disorder)   . HTN (hypertension)   . Tobacco abuse     Past Surgical History  Procedure Date  . Icd implant   . Nose surgery     skin cancer  . Cardiac catheterization   . Coronary angioplasty   . Tee without cardioversion 07/22/2011    Procedure: TRANSESOPHAGEAL ECHOCARDIOGRAM (TEE);  Surgeon: Lewayne Bunting, MD;  Location: [facility] ENDOSCOPY;  Service: Cardiovascular;  Laterality: N/A;  . Coronary artery bypass graft 07/23/2011    Procedure: CORONARY ARTERY BYPASS GRAFTING (CABG);  Surgeon: Purcell Nails, MD;  Location: [facility] OR;  Service: Open Heart Surgery;  Laterality: N/A;  CABG x 3  using bilateral greater saphenous veins harvested endoscopically  . Aortic valve replacement 07/23/2011    Procedure: AORTIC VALVE REPLACEMENT (AVR);  Surgeon: Purcell Nails, MD;  Location: [facility] OR;  Service: Open Heart Surgery;  Laterality: N/A;    History reviewed. No pertinent family history.  History  Substance Use Topics  . Smoking status: Former Smoker -- 1.0 packs/day    Quit date: 07/16/2011  . Smokeless tobacco: Not on file  . Alcohol Use: No      Review of Systems  Constitutional: Negative for fever.  HENT: Negative for rhinorrhea, drooling and neck pain.   Eyes: Negative for pain.  Respiratory: Negative for cough and shortness of breath.   Cardiovascular: Negative for chest pain and leg swelling.  Gastrointestinal: Negative for nausea, vomiting, abdominal pain and diarrhea.  Genitourinary: Negative for dysuria and hematuria.  Musculoskeletal: Negative for gait problem.  Skin: Negative for color change.  Neurological: Negative for numbness and headaches.  Hematological: Negative for adenopathy.  Psychiatric/Behavioral: Negative for behavioral problems.  All other systems reviewed and are negative.    Allergies  Procaine hcl and Rosuvastatin  Home Medications   Current Outpatient Rx  Name Route Sig Dispense Refill  . ALBUTEROL SULFATE HFA 108 (90 BASE) MCG/ACT IN AERS Inhalation Inhale 2 puffs into the lungs every 6 (six) hours as needed. For shortness of breath    . AMOXICILLIN 500 MG  PO CAPS  Take 4 caps (2mg ) 30 to 60 minutes prior to dental procedure 20 capsule 0  . ASPIRIN 81 MG PO TABS Oral Take 81 mg by mouth daily.     . ATENOLOL 25 MG PO TABS Oral Take 1 tablet (25 mg total) by mouth daily. 30 tablet 1  . ATORVASTATIN CALCIUM 80 MG PO TABS Oral Take 40 mg by mouth daily.      . BUDESONIDE-FORMOTEROL FUMARATE 160-4.5 MCG/ACT IN AERO Inhalation Inhale 2 puffs into the lungs 2 (two) times daily.     . CHOLECALCIFEROL 400 UNIT/ML PO LIQD Oral Take 400 Units  by mouth daily.      Marland Kitchen FLUNISOLIDE 0.025 % NA SOLN Inhalation Inhale 2 sprays into the lungs 2 (two) times daily.      Marland Kitchen GABAPENTIN 600 MG PO TABS Oral Take 600 mg by mouth 3 (three) times daily.      . INSULIN ASPART 100 UNIT/ML [place] SOLN Subcutaneous Inject 12 Units into the skin 3 (three) times daily before meals.     . INSULIN GLARGINE 100 UNIT/ML [place] SOLN Subcutaneous Inject 74 Units into the skin at bedtime.    Marland Kitchen LISINOPRIL 10 MG PO TABS Oral Take 1 tablet (10 mg total) by mouth daily. 30 tablet 11  . PAROXETINE HCL 20 MG PO TABS Oral Take 10 mg by mouth daily as needed. For PTSD    . POLYSACCHARIDE IRON 150 MG PO CAPS Oral Take 1 capsule (150 mg total) by mouth daily. 30 each 1  . HYDROCODONE-ACETAMINOPHEN 5-500 MG PO TABS Oral Take 1-2 tablets by mouth every 6 (six) hours as needed for pain. 20 tablet 0    BP 168/93  Pulse 83  Temp 97.5 F (36.4 C) (Oral)  Resp 20  SpO2 94%  Physical Exam  Nursing note and vitals reviewed. Constitutional: He is oriented to person, place, and time. He appears well-developed and well-nourished.  HENT:  Head: Normocephalic and atraumatic.  Right Ear: External ear normal.  Left Ear: External ear normal.  Nose: Nose normal.  Mouth/Throat: Oropharynx is clear and moist. No oropharyngeal exudate.  Eyes: Conjunctivae and EOM are normal. Pupils are equal, round, and reactive to light.  Neck: Normal range of motion. Neck supple.  Cardiovascular: Normal rate, regular rhythm, normal heart sounds and intact distal pulses.  Exam reveals no gallop and no friction rub.   No murmur heard. Pulmonary/Chest: Effort normal and breath sounds normal. No respiratory distress. He has no wheezes.  Abdominal: Soft. Bowel sounds are normal. He exhibits no distension. There is no tenderness. There is no rebound and no guarding.  Musculoskeletal: Normal range of motion. He exhibits no edema and no tenderness.       Mild right lower cervical paraspinal ttp. Mild ttp of lower  lumbar spine.   Neurological: He is alert and oriented to person, place, and time.  Skin: Skin is warm and dry.       Several small abrasions on hands and right leg.  Psychiatric: He has a normal mood and affect. His behavior is normal.    ED Course  Procedures (including critical care time)  Labs Reviewed - No data to display Dg Chest 1 View  04/24/2012  *RADIOLOGY REPORT*  Clinical Data: Chest pain following traumatic injury  CHEST - 1 VIEW  Comparison: 08/26/2011  Findings: An implantable defibrillator and postoperative changes are again seen.  The cardiac shadow is stable.  The lungs are clear bilaterally.  No acute bony  abnormality is seen.  IMPRESSION: Stable appearance of the chest.  No acute abnormality is seen.   Original Report Authenticated By: Phillips Odor, M.D.    Dg Cervical Spine 2-3 Views  04/24/2012  *RADIOLOGY REPORT*  Clinical Data: Fall.  CERVICAL SPINE - 2-3 VIEW  Comparison: None.  Findings: Cervical spine vertebral bodies are normally aligned from the skull base though the cervicothoracic junction.  C5, C6, and C7 are partially obscured in the lateral projection by the patient's shoulders.  No acute fracture is identified.  The prevertebral soft tissue contour is within normal limits.  IMPRESSION: No acute bony abnormality identified.  The lower cervical spine vertebral bodies are partially obscured by the bones and soft tissues of the patient's shoulders in the lateral projection.  If there is a high clinical suspicion for cervical spine fracture, CT cervical spine could be performed for more complete visualization.   Original Report Authenticated By: Britta Mccreedy, M.D.    Dg Thoracic Spine 2 View  04/24/2012  *RADIOLOGY REPORT*  Clinical Data: Fall.  Back pain.  THORACIC SPINE - 2 VIEW  Comparison: Chest radiograph 08/26/2011  Findings: There are changes of median sternotomy for CABG and aortic valve replacement.  A left chest wall pacer / AICD is present.  The thoracic  spine vertebral bodies are normally aligned.  There is osteophyte formation at several lower thoracic spine vertebral bodies, stable compared to prior chest radiograph.  No acute fracture or suspicious bony abnormality is identified.  IMPRESSION: No acute findings.   Original Report Authenticated By: Britta Mccreedy, M.D.    Dg Lumbar Spine 2-3 Views  04/24/2012  *RADIOLOGY REPORT*  Clinical Data: Larey Seat.  Back pain.  LUMBAR SPINE - 2-3 VIEW  Comparison: None.  Findings: There is a superior endplate compression deformity of L2 suspicious for acute fracture.  The remaining lumbar vertebral bodies appear intact.  Degenerative changes noted the lower lumbar spine and lower thoracic spine.  IMPRESSION: L2 compression fracture.  CT recommended for further evaluation.   Original Report Authenticated By: P. Loralie Champagne, M.D.      1. Lumbar compression fracture   2. Fall      Date: 04/24/2012  Rate: 84  Rhythm: normal sinus rhythm  QRS Axis: left  Intervals: PR prolonged  ST/T Wave abnormalities: nonspecific ST changes  Conduction Disutrbances:first-degree A-V block   Narrative Interpretation: No new ST or T wave changes cw ischemia.   Old EKG Reviewed: changes noted    MDM  12:52 AM 66 y.o. male pw fall from 16 feet out of tree stand. Pt denies loc, walked in to his house and called EMS d/t low back pain. Pt AFVSS here, appears well on exam, no abd ttp. Pt has mild midline lower lumbar ttp. Will get screening films, pain control.   Pt found to have lumbar compression fx. His abd remains benign and his pain is controlled. I have discussed the diagnosis/risks/treatment options with the patient and believe the pt to be eligible for discharge home to follow-up with pcp in 1 week. We also discussed returning to the ED immediately if new or worsening sx occur. We discussed the sx which are most concerning (e.g., worsening pain) that necessitate immediate return. Any new prescriptions provided to the  patient are listed below.  New Prescriptions   HYDROCODONE-ACETAMINOPHEN (VICODIN) 5-500 MG PER TABLET    Take 1-2 tablets by mouth every 6 (six) hours as needed for pain.   Clinical Impression 1. Lumbar compression fracture  2. Madelynn Done, MD 04/25/12 [phone]

## 2012-04-24 NOTE — ED Notes (Signed)
Pt placed in cervical collar

## 2012-04-24 NOTE — ED Notes (Signed)
wc to car

## 2012-04-30 NOTE — ED Provider Notes (Signed)
I saw and evaluated the patient, reviewed the resident's note and I agree with the findings and plan.  66 year old male presenting with lower back pain after falling from a tree stand. No loss of consciousness. On exam he appears mildly uncomfortable but no acute distress. He has midline lumbar tenderness without step-off or deformity. No verlying skin changes. Lungs are clear with good air movement. There is no increased work of breathing. Abdomen is nontender and not distended. Neurological examination is nonfocal. Imaging significant for lumbar compression fracture which likely acute because it corresponds to this region of tenderness. Patient has no neurological complaints. He can and bili. Feel he is safe for discharge at this time. Prescriptions for pain medication provided. Return precautions were discussed.  Raeford Razor, MD 04/30/12 [phone]

## 2013-02-05 IMAGING — CR DG CHEST 2V
2 series · 2 of 2 positions shown · non-contrast
Comparison: None.

CLINICAL DATA: Ventricular fibrillation.

CHEST - 2 VIEW

[w chest pa]
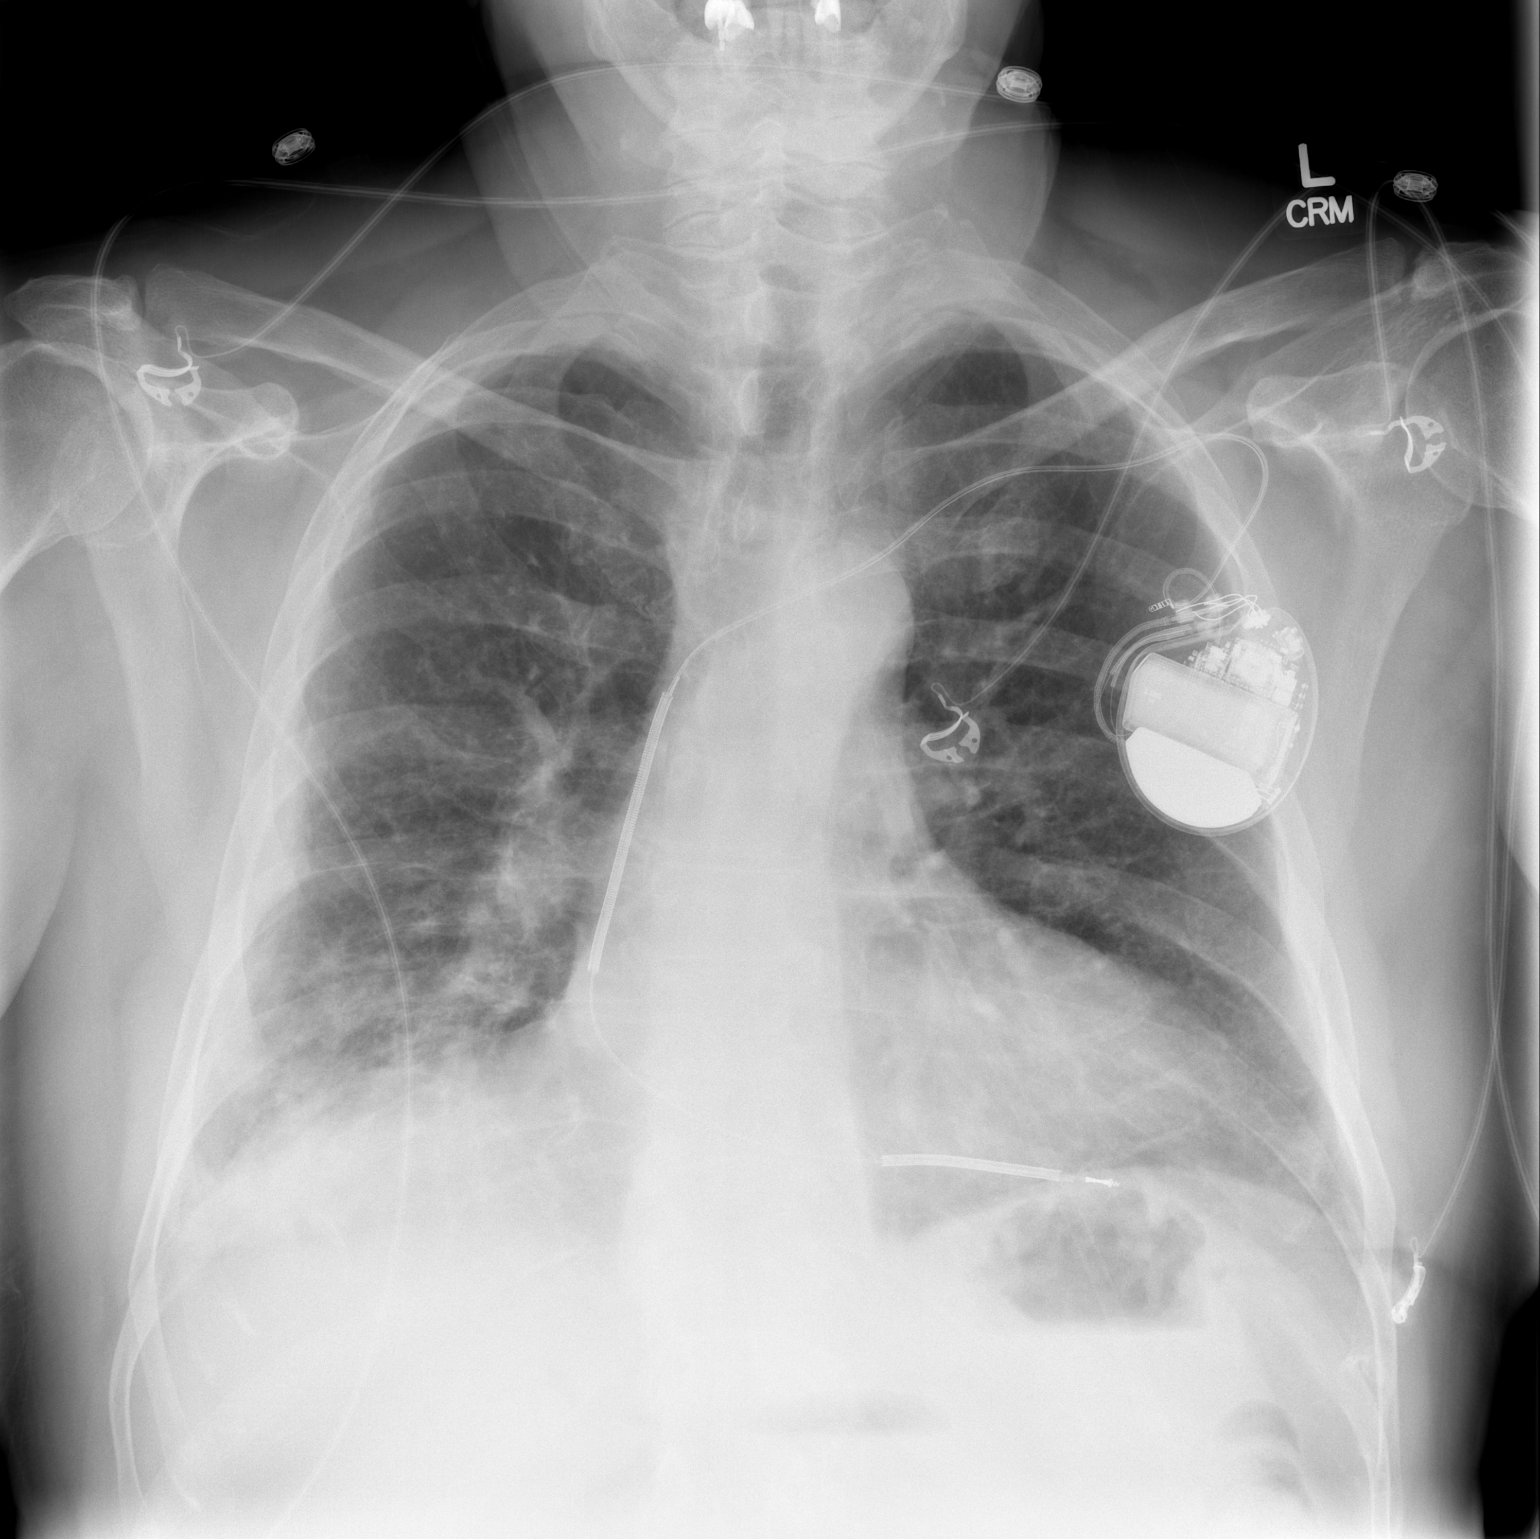

[w chest lat]
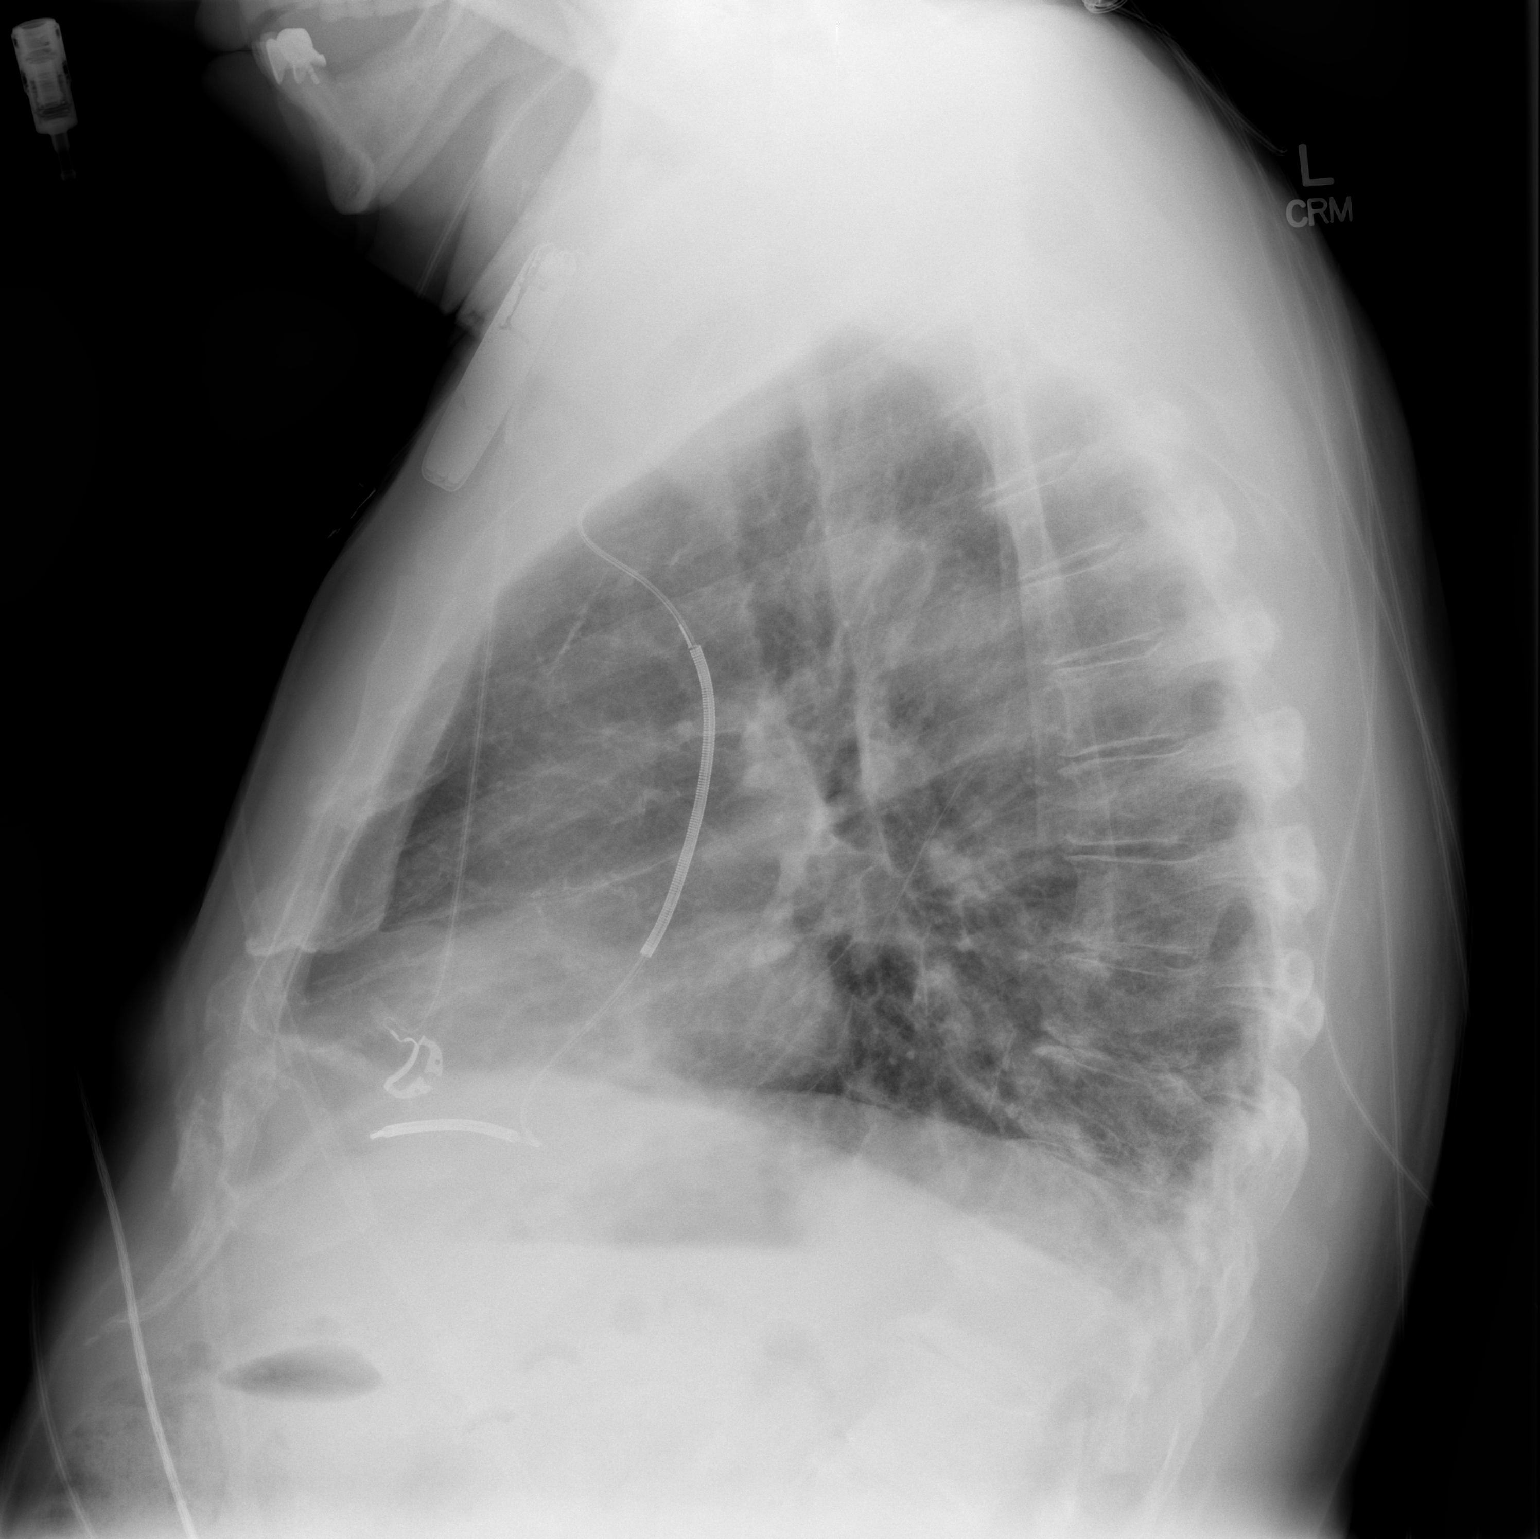

[2 of 2 positions shown; findings below may reference images not displayed]

FINDINGS: Trachea is midline.  Heart is mildly enlarged.  ICD lead
tip projects over the right ventricle.  There is right basilar
airspace opacification.  Question minimal involvement of the left
lower lobe as well.  No definite pleural fluid.
IMPRESSION: Right basilar airspace disease is suspicious for pneumonia.  Follow-
up to clearing is recommended.

## 2014-06-10 ENCOUNTER — Encounter: Payer: Medicare Other | Admitting: Internal Medicine

## 2014-06-14 ENCOUNTER — Encounter: Payer: Self-pay | Admitting: Internal Medicine

## 2014-07-27 ENCOUNTER — Encounter (HOSPITAL_COMMUNITY): Payer: Self-pay | Admitting: Cardiovascular Disease

## 2014-09-06 ENCOUNTER — Encounter: Payer: Self-pay | Admitting: *Deleted

## 2014-09-06 ENCOUNTER — Ambulatory Visit: Payer: Medicare Other | Admitting: Internal Medicine

## 2014-12-16 ENCOUNTER — Encounter (HOSPITAL_COMMUNITY): Payer: Self-pay | Admitting: Emergency Medicine

## 2014-12-16 ENCOUNTER — Emergency Department (HOSPITAL_COMMUNITY)
Admission: EM | Admit: 2014-12-16 | Discharge: 2014-12-16 | Disposition: A | Discharge status: Home / Self Care | Payer: Non-veteran care | Attending: Emergency Medicine | Admitting: Emergency Medicine

## 2014-12-16 DIAGNOSIS — J449 Chronic obstructive pulmonary disease, unspecified: Secondary | ICD-10-CM | POA: Insufficient documentation

## 2014-12-16 DIAGNOSIS — I2510 Atherosclerotic heart disease of native coronary artery without angina pectoris: Secondary | ICD-10-CM | POA: Insufficient documentation

## 2014-12-16 DIAGNOSIS — Z87891 Personal history of nicotine dependence: Secondary | ICD-10-CM | POA: Insufficient documentation

## 2014-12-16 DIAGNOSIS — I10 Essential (primary) hypertension: Secondary | ICD-10-CM | POA: Diagnosis not present

## 2014-12-16 DIAGNOSIS — F4310 Post-traumatic stress disorder, unspecified: Secondary | ICD-10-CM | POA: Insufficient documentation

## 2014-12-16 DIAGNOSIS — Z85828 Personal history of other malignant neoplasm of skin: Secondary | ICD-10-CM | POA: Diagnosis not present

## 2014-12-16 DIAGNOSIS — I252 Old myocardial infarction: Secondary | ICD-10-CM | POA: Diagnosis not present

## 2014-12-16 DIAGNOSIS — Z9889 Other specified postprocedural states: Secondary | ICD-10-CM | POA: Diagnosis not present

## 2014-12-16 DIAGNOSIS — Z7982 Long term (current) use of aspirin: Secondary | ICD-10-CM | POA: Diagnosis not present

## 2014-12-16 DIAGNOSIS — Z794 Long term (current) use of insulin: Secondary | ICD-10-CM | POA: Insufficient documentation

## 2014-12-16 DIAGNOSIS — Z9861 Coronary angioplasty status: Secondary | ICD-10-CM | POA: Diagnosis not present

## 2014-12-16 DIAGNOSIS — R52 Pain, unspecified: Secondary | ICD-10-CM

## 2014-12-16 DIAGNOSIS — Z79899 Other long term (current) drug therapy: Secondary | ICD-10-CM | POA: Insufficient documentation

## 2014-12-16 DIAGNOSIS — E119 Type 2 diabetes mellitus without complications: Secondary | ICD-10-CM | POA: Insufficient documentation

## 2014-12-16 DIAGNOSIS — G8918 Other acute postprocedural pain: Secondary | ICD-10-CM | POA: Insufficient documentation

## 2014-12-16 DIAGNOSIS — R58 Hemorrhage, not elsewhere classified: Secondary | ICD-10-CM

## 2014-12-16 LAB — I-STAT CHEM 8, ED
BUN: 17 mg/dL (ref 6–23)
CHLORIDE: 99 mmol/L (ref 96–112)
CREATININE: 0.7 mg/dL (ref 0.50–1.35)
Calcium, Ion: 1.06 mmol/L — ABNORMAL LOW (ref 1.13–1.30)
Glucose, Bld: 306 mg/dL — ABNORMAL HIGH (ref 70–99)
HCT: 45 % (ref 39.0–52.0)
Hemoglobin: 15.3 g/dL (ref 13.0–17.0)
POTASSIUM: 4.3 mmol/L (ref 3.5–5.1)
Sodium: 136 mmol/L (ref 135–145)
TCO2: 22 mmol/L (ref 0–100)

## 2014-12-16 LAB — CBC WITH DIFFERENTIAL/PLATELET
BASOS ABS: 0.1 10*3/uL (ref 0.0–0.1)
Basophils Relative: 1 % (ref 0–1)
Eosinophils Absolute: 5.5 10*3/uL — ABNORMAL HIGH (ref 0.0–0.7)
Eosinophils Relative: 43 % — ABNORMAL HIGH (ref 0–5)
HEMATOCRIT: 42.1 % (ref 39.0–52.0)
HEMOGLOBIN: 13.6 g/dL (ref 13.0–17.0)
Lymphocytes Relative: 19 % (ref 12–46)
Lymphs Abs: 2.4 10*3/uL (ref 0.7–4.0)
MCH: 28.4 pg (ref 26.0–34.0)
MCHC: 32.3 g/dL (ref 30.0–36.0)
MCV: 87.9 fL (ref 78.0–100.0)
MONOS PCT: 5 % (ref 3–12)
Monocytes Absolute: 0.6 10*3/uL (ref 0.1–1.0)
Neutro Abs: 4 10*3/uL (ref 1.7–7.7)
Neutrophils Relative %: 32 % — ABNORMAL LOW (ref 43–77)
PLATELETS: 245 10*3/uL (ref 150–400)
RBC: 4.79 MIL/uL (ref 4.22–5.81)
RDW: 14 % (ref 11.5–15.5)
WBC: 12.6 10*3/uL — AB (ref 4.0–10.5)

## 2014-12-16 MED ORDER — BACITRACIN ZINC 500 UNIT/GM EX OINT
TOPICAL_OINTMENT | CUTANEOUS | Status: AC
  Administered 2014-12-16: 16:00:00
  Filled 2014-12-16: qty 0.9

## 2014-12-16 NOTE — ED Provider Notes (Signed)
CSN: [id number]     Arrival date & time 12/16/14  1428 History   First MD Initiated Contact with Patient 12/16/14 [phone]     Chief Complaint  Patient presents with  . Post-op Problem     (Consider location/radiation/quality/duration/timing/severity/associated sxs/prior Treatment) HPI Patient had skin cancer removed from left temporal area 2 weeks ago. Today he developed spontaneous bleeding from the wound. Bleeding stopped after direct pressure was applied by EMS personnel. Patient presently asymptomatic. He felt well before the event. No other associated symptoms no lightheadedness.  Past Medical History  Diagnosis Date  . Myocardial infarction   . Ventricular tachycardia   . Diabetes mellitus   . COPD (chronic obstructive pulmonary disease)   . CAD (coronary artery disease) Dec 2012    s/p CABG x 3 with AVR; has normal EF  . Aortic stenosis Dec 2012    s/p AVR  . S/P ICD (internal cardiac defibrillator) procedure   . PTSD (post-traumatic stress disorder)   . HTN (hypertension)   . Tobacco abuse    Past Surgical History  Procedure Laterality Date  . Icd implant    . Nose surgery      skin cancer  . Cardiac catheterization    . Coronary angioplasty    . Tee without cardioversion  07/22/2011    Procedure: TRANSESOPHAGEAL ECHOCARDIOGRAM (TEE);  Surgeon: Lelon Perla, MD;  Location: [facility] ENDOSCOPY;  Service: Cardiovascular;  Laterality: N/A;  . Coronary artery bypass graft  07/23/2011    Procedure: CORONARY ARTERY BYPASS GRAFTING (CABG);  Surgeon: Rexene Alberts, MD;  Location: [place];  Service: Open Heart Surgery;  Laterality: N/A;  CABG x 3 using bilateral greater saphenous veins harvested endoscopically  . Aortic valve replacement  07/23/2011    Procedure: AORTIC VALVE REPLACEMENT (AVR);  Surgeon: Rexene Alberts, MD;  Location: [place] [facility];  Service: Open Heart Surgery;  Laterality: N/A;  . Left heart catheterization with coronary angiogram N/A 07/18/2011    Procedure: LEFT HEART  CATHETERIZATION WITH CORONARY ANGIOGRAM;  Surgeon: Sherren Mocha, MD;  Location: [facility] CATH LAB;  Service: Cardiovascular;  Laterality: N/A;  . Fractional flow reserve wire N/A 07/18/2011    Procedure: FRACTIONAL FLOW RESERVE WIRE;  Surgeon: Sherren Mocha, MD;  Location: [facility] CATH LAB;  Service: Cardiovascular;  Laterality: N/A;   History reviewed. No pertinent family history. History  Substance Use Topics  . Smoking status: Former Smoker -- 1.00 packs/day    Quit date: 07/16/2011  . Smokeless tobacco: Not on file  . Alcohol Use: No    Review of Systems  Skin: Positive for wound.       Surgical wound at left temporal area      Allergies  Procaine hcl and Rosuvastatin  Home Medications   Prior to Admission medications   Medication Sig Start Date End Date Taking? Authorizing Provider  albuterol (PROVENTIL HFA;VENTOLIN HFA) 108 (90 BASE) MCG/ACT inhaler Inhale 2 puffs into the lungs every 6 (six) hours as needed. For shortness of breath    Historical Provider, MD  amoxicillin (AMOXIL) 500 MG capsule Take 4 caps ('2mg'$ ) 30 to 60 minutes prior to dental procedure 09/25/11   Burtis Junes, NP  aspirin 81 MG tablet Take 81 mg by mouth daily.     Historical Provider, MD  atenolol (TENORMIN) 25 MG tablet Take 1 tablet (25 mg total) by mouth daily. 07/30/11 07/29/12  Wayne E Gold, PA-C  atorvastatin (LIPITOR) 80 MG tablet Take 40 mg by mouth daily.  Historical Provider, MD  cholecalciferol (D-VI-SOL) 400 UNIT/ML LIQD Take 400 Units by mouth daily.      Historical Provider, MD  flunisolide (NASALIDE) 0.025 % SOLN Inhale 2 sprays into the lungs 2 (two) times daily.      Historical Provider, MD  gabapentin (NEURONTIN) 600 MG tablet Take 600 mg by mouth 3 (three) times daily.      Historical Provider, MD  insulin aspart (NOVOLOG) 100 UNIT/ML injection Inject 12 Units into the skin 3 (three) times daily before meals.     Historical Provider, MD  insulin glargine (LANTUS) 100 UNIT/ML injection  Inject 74 Units into the skin at bedtime.    Historical Provider, MD  lisinopril (PRINIVIL,ZESTRIL) 10 MG tablet Take 1 tablet (10 mg total) by mouth daily. 09/04/11 09/03/12  Burtis Junes, NP  PARoxetine (PAXIL) 20 MG tablet Take 10 mg by mouth daily as needed. For PTSD    Historical Provider, MD  polysaccharide iron (NIFEREX) 150 MG CAPS capsule Take 1 capsule (150 mg total) by mouth daily. 07/30/11   Wayne E Gold, PA-C   BP 138/76 mmHg  Pulse 82  Temp(Src) 97.5 F (36.4 C) (Oral)  Resp 16  SpO2 93% Physical Exam  Constitutional: He is oriented to person, place, and time. He appears well-developed and well-nourished.  HENT:  Head: Normocephalic and atraumatic.  Eyes: Conjunctivae are normal. Pupils are equal, round, and reactive to light.  Neck: Neck supple. No tracheal deviation present. No thyromegaly present.  Cardiovascular: Normal rate and regular rhythm.   No murmur heard. Pulmonary/Chest: Effort normal and breath sounds normal.  Abdominal: Soft. Bowel sounds are normal. He exhibits no distension. There is no tenderness.  Musculoskeletal: Normal range of motion. He exhibits no edema or tenderness.  Neurological: He is alert and oriented to person, place, and time. Coordination normal.  Skin: Skin is warm and dry. No rash noted.  Clean-appearing dime-sized open wound at left temporal area. No bleeding. No hematoma present  Psychiatric: He has a normal mood and affect.  Nursing note and vitals reviewed.   ED Course  Procedures (including critical care time) Labs Review Labs Reviewed - No data to display  Imaging Review No results found.   EKG Interpretation None     5:30 PM patient remains asymptomatic. No further bleeding. Antibiotic ointment and bandage placed on wound. Results for orders placed or performed during the hospital encounter of 12/16/14  CBC with Differential/Platelet  Result Value Ref Range   WBC 12.6 (H) 4.0 - 10.5 K/uL   RBC 4.79 4.22 - 5.81  MIL/uL   Hemoglobin 13.6 13.0 - 17.0 g/dL   HCT 42.1 39.0 - 52.0 %   MCV 87.9 78.0 - 100.0 fL   MCH 28.4 26.0 - 34.0 pg   MCHC 32.3 30.0 - 36.0 g/dL   RDW 14.0 11.5 - 15.5 %   Platelets 245 150 - 400 K/uL   Neutrophils Relative % PENDING 43 - 77 %   Neutro Abs PENDING 1.7 - 7.7 K/uL   Band Neutrophils PENDING 0 - 10 %   Lymphocytes Relative PENDING 12 - 46 %   Lymphs Abs PENDING 0.7 - 4.0 K/uL   Monocytes Relative PENDING 3 - 12 %   Monocytes Absolute PENDING 0.1 - 1.0 K/uL   Eosinophils Relative PENDING 0 - 5 %   Eosinophils Absolute PENDING 0.0 - 0.7 K/uL   Basophils Relative PENDING 0 - 1 %   Basophils Absolute PENDING 0.0 - 0.1 K/uL  WBC Morphology PENDING    RBC Morphology PENDING    Smear Review PENDING    nRBC PENDING 0 /100 WBC   Metamyelocytes Relative PENDING %   Myelocytes PENDING %   Promyelocytes Absolute PENDING %   Blasts PENDING %  I-stat chem 8, ed  Result Value Ref Range   Sodium 136 135 - 145 mmol/L   Potassium 4.3 3.5 - 5.1 mmol/L   Chloride 99 96 - 112 mmol/L   BUN 17 6 - 23 mg/dL   Creatinine, Ser 0.70 0.50 - 1.35 mg/dL   Glucose, Bld 306 (H) 70 - 99 mg/dL   Calcium, Ion 1.06 (L) 1.13 - 1.30 mmol/L   TCO2 22 0 - 100 mmol/L   Hemoglobin 15.3 13.0 - 17.0 g/dL   HCT 45.0 39.0 - 52.0 %   No results found.  MDM  Plan he is to follow-up with his surgeon on Monday, 12/19/2014. He reports that he did not take this morning's dose of insulin. He is instructed to resume evening insulin dosage as regularly scheduled. Diagnoses #1 postoperative bleeding #2 hyperglycemia Final diagnoses:  None        Orlie Dakin, MD 12/16/14 1740

## 2014-12-16 NOTE — ED Notes (Addendum)
Per EMS pt had surgery for skin cell removal above left eye 2 weeks ago, today surgical site began bleeding with arterial bleed. Per EMS, blood "spurted" all over the wall in "fountain marks", on ceiling, all across room. Bleeding clotted and became controlled after [employer] applied pressure. Quarter-sized round bit of missing skin to left forehead, exposed red nodule pulsating to rhythm of heart, no bleeding at this time.

## 2014-12-16 NOTE — Discharge Instructions (Signed)
If bleeding recurs take a fingertip and placed direct pressure over the wound for 20 minutes. If you can't get the bleeding to stop, return here immediately. Call your surgeon on Monday, 12/19/2014 to see if he wants to see sooner follow-up. Tell him about today's visit here. Your blood sugar was elevated today at 306. Resume your evening dose of insulin as regularly scheduled.

## 2015-01-09 ENCOUNTER — Other Ambulatory Visit: Payer: Self-pay | Admitting: Oncology

## 2015-01-09 DIAGNOSIS — R918 Other nonspecific abnormal finding of lung field: Secondary | ICD-10-CM

## 2015-01-10 ENCOUNTER — Other Ambulatory Visit: Payer: Self-pay | Admitting: Oncology

## 2015-01-10 DIAGNOSIS — R918 Other nonspecific abnormal finding of lung field: Secondary | ICD-10-CM

## 2015-02-15 ENCOUNTER — Encounter: Payer: Self-pay | Admitting: *Deleted

## 2015-02-15 ENCOUNTER — Emergency Department
Admission: EM | Admit: 2015-02-15 | Discharge: 2015-02-15 | Disposition: A | Discharge status: Home / Self Care | Payer: No Typology Code available for payment source | Attending: Emergency Medicine | Admitting: Emergency Medicine

## 2015-02-15 ENCOUNTER — Emergency Department: Payer: No Typology Code available for payment source

## 2015-02-15 DIAGNOSIS — S80812A Abrasion, left lower leg, initial encounter: Secondary | ICD-10-CM | POA: Insufficient documentation

## 2015-02-15 DIAGNOSIS — S80811A Abrasion, right lower leg, initial encounter: Secondary | ICD-10-CM | POA: Insufficient documentation

## 2015-02-15 DIAGNOSIS — S20212A Contusion of left front wall of thorax, initial encounter: Secondary | ICD-10-CM | POA: Insufficient documentation

## 2015-02-15 DIAGNOSIS — S50311A Abrasion of right elbow, initial encounter: Secondary | ICD-10-CM | POA: Diagnosis not present

## 2015-02-15 DIAGNOSIS — Y9389 Activity, other specified: Secondary | ICD-10-CM | POA: Insufficient documentation

## 2015-02-15 DIAGNOSIS — Y998 Other external cause status: Secondary | ICD-10-CM | POA: Diagnosis not present

## 2015-02-15 DIAGNOSIS — Z7982 Long term (current) use of aspirin: Secondary | ICD-10-CM | POA: Diagnosis not present

## 2015-02-15 DIAGNOSIS — Y92410 Unspecified street and highway as the place of occurrence of the external cause: Secondary | ICD-10-CM | POA: Diagnosis not present

## 2015-02-15 DIAGNOSIS — E119 Type 2 diabetes mellitus without complications: Secondary | ICD-10-CM | POA: Insufficient documentation

## 2015-02-15 DIAGNOSIS — Z79899 Other long term (current) drug therapy: Secondary | ICD-10-CM | POA: Insufficient documentation

## 2015-02-15 DIAGNOSIS — F4310 Post-traumatic stress disorder, unspecified: Secondary | ICD-10-CM | POA: Diagnosis not present

## 2015-02-15 DIAGNOSIS — S299XXA Unspecified injury of thorax, initial encounter: Secondary | ICD-10-CM | POA: Diagnosis present

## 2015-02-15 DIAGNOSIS — Z87891 Personal history of nicotine dependence: Secondary | ICD-10-CM | POA: Insufficient documentation

## 2015-02-15 DIAGNOSIS — Z794 Long term (current) use of insulin: Secondary | ICD-10-CM | POA: Diagnosis not present

## 2015-02-15 DIAGNOSIS — S30811A Abrasion of abdominal wall, initial encounter: Secondary | ICD-10-CM | POA: Diagnosis not present

## 2015-02-15 DIAGNOSIS — I10 Essential (primary) hypertension: Secondary | ICD-10-CM | POA: Diagnosis not present

## 2015-02-15 DIAGNOSIS — Z792 Long term (current) use of antibiotics: Secondary | ICD-10-CM | POA: Insufficient documentation

## 2015-02-15 MED ORDER — BACITRACIN-NEOMYCIN-POLYMYXIN 400-5-5000 EX OINT
TOPICAL_OINTMENT | CUTANEOUS | Status: AC
  Administered 2015-02-15: 1 via TOPICAL
  Filled 2015-02-15: qty 1

## 2015-02-15 MED ORDER — BACITRACIN-NEOMYCIN-POLYMYXIN OINTMENT TUBE
TOPICAL_OINTMENT | Freq: Once | CUTANEOUS | Status: AC
  Administered 2015-02-15: 1 via TOPICAL

## 2015-02-15 MED ORDER — TRAMADOL HCL 50 MG PO TABS
50.0000 mg | ORAL_TABLET | Freq: Once | ORAL | Status: AC
  Administered 2015-02-15: 50 mg via ORAL

## 2015-02-15 MED ORDER — METHOCARBAMOL 500 MG PO TABS
500.0000 mg | ORAL_TABLET | Freq: Once | ORAL | Status: AC
  Administered 2015-02-15: 500 mg via ORAL

## 2015-02-15 MED ORDER — METHOCARBAMOL 500 MG PO TABS: 500.0000 mg | ORAL_TABLET | Freq: Four times a day (QID) | ORAL | Status: DC

## 2015-02-15 MED ORDER — METHOCARBAMOL 1000 MG/10ML IJ SOLN: 500.0000 mg | Freq: Once | INTRAMUSCULAR | Status: DC

## 2015-02-15 MED ORDER — TRAMADOL HCL 50 MG PO TABS
ORAL_TABLET | ORAL | Status: AC
  Administered 2015-02-15: 50 mg via ORAL
  Filled 2015-02-15: qty 1

## 2015-02-15 MED ORDER — TRAMADOL HCL 50 MG PO TABS
50.0000 mg | ORAL_TABLET | Freq: Four times a day (QID) | ORAL | Status: DC | PRN
Start: 2015-02-15 — End: 2015-04-24

## 2015-02-15 MED ORDER — METHOCARBAMOL 500 MG PO TABS
ORAL_TABLET | ORAL | Status: AC
  Administered 2015-02-15: 500 mg via ORAL
  Filled 2015-02-15: qty 1
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
## 2015-02-15 NOTE — ED Notes (Signed)
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 Pt received Chemo today.

## 2015-02-22 ENCOUNTER — Emergency Department
Admission: EM | Admit: 2015-02-22 | Discharge: 2015-02-22 | Disposition: A | Discharge status: Home / Self Care | Payer: No Typology Code available for payment source | Attending: Emergency Medicine | Admitting: Emergency Medicine

## 2015-02-22 DIAGNOSIS — Z794 Long term (current) use of insulin: Secondary | ICD-10-CM | POA: Diagnosis not present

## 2015-02-22 DIAGNOSIS — E119 Type 2 diabetes mellitus without complications: Secondary | ICD-10-CM | POA: Insufficient documentation

## 2015-02-22 DIAGNOSIS — Z7951 Long term (current) use of inhaled steroids: Secondary | ICD-10-CM | POA: Insufficient documentation

## 2015-02-22 DIAGNOSIS — I10 Essential (primary) hypertension: Secondary | ICD-10-CM | POA: Diagnosis not present

## 2015-02-22 DIAGNOSIS — Z79899 Other long term (current) drug therapy: Secondary | ICD-10-CM | POA: Diagnosis not present

## 2015-02-22 DIAGNOSIS — Z7982 Long term (current) use of aspirin: Secondary | ICD-10-CM | POA: Insufficient documentation

## 2015-02-22 DIAGNOSIS — S81801A Unspecified open wound, right lower leg, initial encounter: Secondary | ICD-10-CM

## 2015-02-22 DIAGNOSIS — T814XXA Infection following a procedure, initial encounter: Secondary | ICD-10-CM | POA: Diagnosis not present

## 2015-02-22 DIAGNOSIS — Z792 Long term (current) use of antibiotics: Secondary | ICD-10-CM | POA: Insufficient documentation

## 2015-02-22 DIAGNOSIS — Y848 Other medical procedures as the cause of abnormal reaction of the patient, or of later complication, without mention of misadventure at the time of the procedure: Secondary | ICD-10-CM | POA: Diagnosis not present

## 2015-02-22 DIAGNOSIS — Z87891 Personal history of nicotine dependence: Secondary | ICD-10-CM | POA: Diagnosis not present

## 2015-02-22 LAB — BASIC METABOLIC PANEL
ANION GAP: 13 (ref 5–15)
BUN: 17 mg/dL (ref 6–20)
CALCIUM: 8 mg/dL — AB (ref 8.9–10.3)
CO2: 24 mmol/L (ref 22–32)
CREATININE: 0.89 mg/dL (ref 0.61–1.24)
Chloride: 95 mmol/L — ABNORMAL LOW (ref 101–111)
GFR calc Af Amer: >60 mL/min (ref >60)
Glucose, Bld: 326 mg/dL — ABNORMAL HIGH (ref 65–99)
Potassium: 3.6 mmol/L (ref 3.5–5.1)
SODIUM: 132 mmol/L — AB (ref 135–145)

## 2015-02-22 LAB — CBC
HEMATOCRIT: 31.4 % — AB (ref 40.0–52.0)
HEMOGLOBIN: 10.2 g/dL — AB (ref 13.0–18.0)
MCH: 27.3 pg (ref 26.0–34.0)
MCHC: 32.4 g/dL (ref 32.0–36.0)
MCV: 84.3 fL (ref 80.0–100.0)
Platelets: 284 10*3/uL (ref 150–440)
RBC: 3.73 MIL/uL — ABNORMAL LOW (ref 4.40–5.90)
RDW: 16.6 % — AB (ref 11.5–14.5)
WBC: 15.6 10*3/uL — ABNORMAL HIGH (ref 3.8–10.6)

## 2015-02-22 MED ORDER — SILVER SULFADIAZINE 1 % EX CREA
TOPICAL_CREAM | Freq: Once | CUTANEOUS | Status: AC
  Administered 2015-02-22: 16:00:00 via TOPICAL

## 2015-02-22 MED ORDER — SILVER SULFADIAZINE 1 % EX CREA
TOPICAL_CREAM | CUTANEOUS | Status: AC
  Filled 2015-02-22: qty 85
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
## 2015-02-27 LAB — CULTURE, BLOOD (ROUTINE X 2): CULTURE: NO GROWTH

## 2015-03-26 ENCOUNTER — Inpatient Hospital Stay (HOSPITAL_COMMUNITY)
Admission: EM | Admit: 2015-03-26 | Discharge: 2015-03-28 | DRG: 292 | Disposition: A | Discharge status: Home / Self Care | Payer: Non-veteran care | Attending: Family Medicine | Admitting: Family Medicine

## 2015-03-26 ENCOUNTER — Emergency Department (HOSPITAL_COMMUNITY): Payer: Non-veteran care

## 2015-03-26 ENCOUNTER — Encounter (HOSPITAL_COMMUNITY): Payer: Self-pay | Admitting: Physical Medicine and Rehabilitation

## 2015-03-26 DIAGNOSIS — I429 Cardiomyopathy, unspecified: Secondary | ICD-10-CM | POA: Diagnosis present

## 2015-03-26 DIAGNOSIS — I493 Ventricular premature depolarization: Secondary | ICD-10-CM | POA: Diagnosis present

## 2015-03-26 DIAGNOSIS — Z7902 Long term (current) use of antithrombotics/antiplatelets: Secondary | ICD-10-CM | POA: Diagnosis not present

## 2015-03-26 DIAGNOSIS — I10 Essential (primary) hypertension: Secondary | ICD-10-CM | POA: Insufficient documentation

## 2015-03-26 DIAGNOSIS — Z87891 Personal history of nicotine dependence: Secondary | ICD-10-CM | POA: Diagnosis not present

## 2015-03-26 DIAGNOSIS — J702 Acute drug-induced interstitial lung disorders: Secondary | ICD-10-CM | POA: Diagnosis present

## 2015-03-26 DIAGNOSIS — C787 Secondary malignant neoplasm of liver and intrahepatic bile duct: Secondary | ICD-10-CM | POA: Diagnosis present

## 2015-03-26 DIAGNOSIS — Z7982 Long term (current) use of aspirin: Secondary | ICD-10-CM | POA: Diagnosis not present

## 2015-03-26 DIAGNOSIS — C7951 Secondary malignant neoplasm of bone: Secondary | ICD-10-CM | POA: Diagnosis present

## 2015-03-26 DIAGNOSIS — I248 Other forms of acute ischemic heart disease: Secondary | ICD-10-CM | POA: Diagnosis present

## 2015-03-26 DIAGNOSIS — Z794 Long term (current) use of insulin: Secondary | ICD-10-CM | POA: Diagnosis not present

## 2015-03-26 DIAGNOSIS — C3490 Malignant neoplasm of unspecified part of unspecified bronchus or lung: Secondary | ICD-10-CM | POA: Diagnosis present

## 2015-03-26 DIAGNOSIS — C3400 Malignant neoplasm of unspecified main bronchus: Secondary | ICD-10-CM | POA: Diagnosis not present

## 2015-03-26 DIAGNOSIS — J449 Chronic obstructive pulmonary disease, unspecified: Secondary | ICD-10-CM | POA: Diagnosis present

## 2015-03-26 DIAGNOSIS — Z952 Presence of prosthetic heart valve: Secondary | ICD-10-CM | POA: Diagnosis not present

## 2015-03-26 DIAGNOSIS — T451X5A Adverse effect of antineoplastic and immunosuppressive drugs, initial encounter: Secondary | ICD-10-CM | POA: Insufficient documentation

## 2015-03-26 DIAGNOSIS — C7952 Secondary malignant neoplasm of bone marrow: Secondary | ICD-10-CM | POA: Diagnosis present

## 2015-03-26 DIAGNOSIS — Z955 Presence of coronary angioplasty implant and graft: Secondary | ICD-10-CM

## 2015-03-26 DIAGNOSIS — E1159 Type 2 diabetes mellitus with other circulatory complications: Secondary | ICD-10-CM | POA: Diagnosis present

## 2015-03-26 DIAGNOSIS — I25709 Atherosclerosis of coronary artery bypass graft(s), unspecified, with unspecified angina pectoris: Secondary | ICD-10-CM | POA: Diagnosis not present

## 2015-03-26 DIAGNOSIS — E785 Hyperlipidemia, unspecified: Secondary | ICD-10-CM | POA: Diagnosis present

## 2015-03-26 DIAGNOSIS — I2510 Atherosclerotic heart disease of native coronary artery without angina pectoris: Secondary | ICD-10-CM | POA: Diagnosis present

## 2015-03-26 DIAGNOSIS — Z951 Presence of aortocoronary bypass graft: Secondary | ICD-10-CM | POA: Diagnosis not present

## 2015-03-26 DIAGNOSIS — J810 Acute pulmonary edema: Secondary | ICD-10-CM | POA: Diagnosis not present

## 2015-03-26 DIAGNOSIS — I509 Heart failure, unspecified: Secondary | ICD-10-CM

## 2015-03-26 DIAGNOSIS — Z66 Do not resuscitate: Secondary | ICD-10-CM | POA: Diagnosis present

## 2015-03-26 DIAGNOSIS — I5021 Acute systolic (congestive) heart failure: Principal | ICD-10-CM | POA: Diagnosis present

## 2015-03-26 DIAGNOSIS — I252 Old myocardial infarction: Secondary | ICD-10-CM

## 2015-03-26 LAB — CBC WITH DIFFERENTIAL/PLATELET
Basophils Absolute: 0.1 10*3/uL (ref 0.0–0.1)
Basophils Relative: 1 % (ref 0–1)
Eosinophils Absolute: 0.3 10*3/uL (ref 0.0–0.7)
Eosinophils Relative: 2 % (ref 0–5)
HCT: 35.9 % — ABNORMAL LOW (ref 39.0–52.0)
Hemoglobin: 11.1 g/dL — ABNORMAL LOW (ref 13.0–17.0)
LYMPHS ABS: 1.7 10*3/uL (ref 0.7–4.0)
LYMPHS PCT: 13 % (ref 12–46)
MCH: 28.2 pg (ref 26.0–34.0)
MCHC: 30.9 g/dL (ref 30.0–36.0)
MCV: 91.3 fL (ref 78.0–100.0)
MONO ABS: 0.7 10*3/uL (ref 0.1–1.0)
Monocytes Relative: 5 % (ref 3–12)
Neutro Abs: 10.3 10*3/uL — ABNORMAL HIGH (ref 1.7–7.7)
Neutrophils Relative %: 79 % — ABNORMAL HIGH (ref 43–77)
Platelets: 253 10*3/uL (ref 150–400)
RBC: 3.93 MIL/uL — ABNORMAL LOW (ref 4.22–5.81)
RDW: 21.8 % — AB (ref 11.5–15.5)
WBC: 13.1 10*3/uL — AB (ref 4.0–10.5)

## 2015-03-26 LAB — COMPREHENSIVE METABOLIC PANEL
ALBUMIN: 3.2 g/dL — AB (ref 3.5–5.0)
ALT: 16 U/L — AB (ref 17–63)
ANION GAP: 11 (ref 5–15)
AST: 25 U/L (ref 15–41)
Alkaline Phosphatase: 163 U/L — ABNORMAL HIGH (ref 38–126)
BUN: 11 mg/dL (ref 6–20)
CALCIUM: 8 mg/dL — AB (ref 8.9–10.3)
CO2: 29 mmol/L (ref 22–32)
Chloride: 95 mmol/L — ABNORMAL LOW (ref 101–111)
Creatinine, Ser: 0.92 mg/dL (ref 0.61–1.24)
GFR calc Af Amer: >60 mL/min (ref >60)
Glucose, Bld: 331 mg/dL — ABNORMAL HIGH (ref 65–99)
Potassium: 4 mmol/L (ref 3.5–5.1)
Sodium: 135 mmol/L (ref 135–145)
Total Bilirubin: 0.5 mg/dL (ref 0.3–1.2)
Total Protein: 7.7 g/dL (ref 6.5–8.1)

## 2015-03-26 LAB — I-STAT TROPONIN, ED: TROPONIN I, POC: 0.02 ng/mL (ref 0.00–0.08)

## 2015-03-26 LAB — GLUCOSE, CAPILLARY
GLUCOSE-CAPILLARY: 324 mg/dL — AB (ref 65–99)
Glucose-Capillary: 348 mg/dL — ABNORMAL HIGH (ref 65–99)

## 2015-03-26 LAB — TROPONIN I: Troponin I: 0.12 ng/mL — ABNORMAL HIGH (ref <0.031)

## 2015-03-26 LAB — BRAIN NATRIURETIC PEPTIDE: B Natriuretic Peptide: 936.2 pg/mL — ABNORMAL HIGH (ref 0.0–100.0)

## 2015-03-26 MED ORDER — ONDANSETRON HCL 4 MG PO TABS: 8.0000 mg | ORAL_TABLET | Freq: Three times a day (TID) | ORAL | Status: DC | PRN

## 2015-03-26 MED ORDER — INSULIN REGULAR HUMAN (CONC) 500 UNIT/ML ~~LOC~~ SOLN: 5.0000 [IU] | Freq: Two times a day (BID) | SUBCUTANEOUS | Status: DC

## 2015-03-26 MED ORDER — INSULIN ASPART 100 UNIT/ML ~~LOC~~ SOLN: 0.0000 [IU] | Freq: Every day | SUBCUTANEOUS | Status: DC

## 2015-03-26 MED ORDER — PAROXETINE HCL 10 MG PO TABS
10.0000 mg | ORAL_TABLET | Freq: Every day | ORAL | Status: DC
  Administered 2015-03-27 – 2015-03-28 (×2): 10 mg via ORAL
  Filled 2015-03-26 (×2): qty 1

## 2015-03-26 MED ORDER — LEVOFLOXACIN 500 MG PO TABS
500.0000 mg | ORAL_TABLET | Freq: Every day | ORAL | Status: DC
  Administered 2015-03-27 – 2015-03-28 (×2): 500 mg via ORAL
  Filled 2015-03-26 (×2): qty 1

## 2015-03-26 MED ORDER — PANTOPRAZOLE SODIUM 40 MG PO TBEC
40.0000 mg | DELAYED_RELEASE_TABLET | Freq: Every day | ORAL | Status: DC
  Administered 2015-03-26 – 2015-03-28 (×3): 40 mg via ORAL
  Filled 2015-03-26 (×3): qty 1

## 2015-03-26 MED ORDER — ZOLPIDEM TARTRATE 5 MG PO TABS
5.0000 mg | ORAL_TABLET | Freq: Every day | ORAL | Status: DC
  Administered 2015-03-26 – 2015-03-27 (×2): 5 mg via ORAL
  Filled 2015-03-26 (×2): qty 1

## 2015-03-26 MED ORDER — ALBUTEROL SULFATE (2.5 MG/3ML) 0.083% IN NEBU: 2.5000 mg | INHALATION_SOLUTION | RESPIRATORY_TRACT | Status: DC | PRN

## 2015-03-26 MED ORDER — FUROSEMIDE 10 MG/ML IJ SOLN
80.0000 mg | Freq: Two times a day (BID) | INTRAMUSCULAR | Status: DC
  Administered 2015-03-26 – 2015-03-27 (×2): 80 mg via INTRAVENOUS
  Filled 2015-03-26 (×3): qty 8

## 2015-03-26 MED ORDER — SODIUM CHLORIDE 0.9 % IV SOLN: 250.0000 mL | INTRAVENOUS | Status: DC | PRN

## 2015-03-26 MED ORDER — TAMSULOSIN HCL 0.4 MG PO CAPS
0.4000 mg | ORAL_CAPSULE | Freq: Every day | ORAL | Status: DC
  Administered 2015-03-26 – 2015-03-27 (×2): 0.4 mg via ORAL
  Filled 2015-03-26 (×3): qty 1

## 2015-03-26 MED ORDER — INSULIN ASPART 100 UNIT/ML ~~LOC~~ SOLN
0.0000 [IU] | Freq: Three times a day (TID) | SUBCUTANEOUS | Status: DC
  Administered 2015-03-27 – 2015-03-28 (×2): 8 [IU] via SUBCUTANEOUS

## 2015-03-26 MED ORDER — SODIUM CHLORIDE 0.9 % IJ SOLN
3.0000 mL | Freq: Two times a day (BID) | INTRAMUSCULAR | Status: DC
  Administered 2015-03-26 – 2015-03-28 (×4): 3 mL via INTRAVENOUS

## 2015-03-26 MED ORDER — LISINOPRIL 10 MG PO TABS
10.0000 mg | ORAL_TABLET | Freq: Every day | ORAL | Status: DC
  Administered 2015-03-27 – 2015-03-28 (×2): 10 mg via ORAL
  Filled 2015-03-26 (×2): qty 1

## 2015-03-26 MED ORDER — ATORVASTATIN CALCIUM 80 MG PO TABS
80.0000 mg | ORAL_TABLET | Freq: Every evening | ORAL | Status: DC
  Administered 2015-03-26 – 2015-03-27 (×2): 80 mg via ORAL
  Filled 2015-03-26 (×3): qty 1

## 2015-03-26 MED ORDER — SODIUM CHLORIDE 0.9 % IJ SOLN: 3.0000 mL | INTRAMUSCULAR | Status: DC | PRN

## 2015-03-26 MED ORDER — ASPIRIN 81 MG PO CHEW
81.0000 mg | CHEWABLE_TABLET | Freq: Every day | ORAL | Status: DC
  Administered 2015-03-27 – 2015-03-28 (×2): 81 mg via ORAL
  Filled 2015-03-26 (×4): qty 1

## 2015-03-26 MED ORDER — ONDANSETRON HCL 4 MG/2ML IJ SOLN: 4.0000 mg | Freq: Four times a day (QID) | INTRAMUSCULAR | Status: DC | PRN

## 2015-03-26 MED ORDER — INSULIN REGULAR HUMAN (CONC) 500 UNIT/ML ~~LOC~~ SOLN
25.0000 [IU] | Freq: Two times a day (BID) | SUBCUTANEOUS | Status: DC
  Administered 2015-03-26 – 2015-03-28 (×4): 25 [IU] via SUBCUTANEOUS
  Filled 2015-03-26 (×4): qty 20

## 2015-03-26 MED ORDER — GABAPENTIN 600 MG PO TABS
600.0000 mg | ORAL_TABLET | Freq: Three times a day (TID) | ORAL | Status: DC
  Administered 2015-03-26 – 2015-03-28 (×5): 600 mg via ORAL
  Filled 2015-03-26 (×7): qty 1

## 2015-03-26 MED ORDER — HEPARIN SODIUM (PORCINE) 5000 UNIT/ML IJ SOLN
5000.0000 [IU] | Freq: Three times a day (TID) | INTRAMUSCULAR | Status: DC
  Administered 2015-03-26 – 2015-03-28 (×5): 5000 [IU] via SUBCUTANEOUS
  Filled 2015-03-26 (×7): qty 1

## 2015-03-26 MED ORDER — FUROSEMIDE 10 MG/ML IJ SOLN
80.0000 mg | Freq: Once | INTRAMUSCULAR | Status: AC
  Administered 2015-03-26: 80 mg via INTRAVENOUS
  Filled 2015-03-26: qty 8

## 2015-03-26 MED ORDER — ACETAMINOPHEN 325 MG PO TABS
650.0000 mg | ORAL_TABLET | ORAL | Status: DC | PRN
Start: 2015-03-26 — End: 2015-03-28

## 2015-03-26 NOTE — ED Notes (Signed)
Pt performing self cath at the time.

## 2015-03-26 NOTE — ED Notes (Signed)
Pt resting quietly at the time. Vital signs stable. Wife at bedside. Denies pain. Pt and family updated on plan of care for admission.

## 2015-03-26 NOTE — Progress Notes (Signed)
Patient placed on CPAP auto 29mx and 466m. 4L O2 bleed in. Patient tolerating well. RT will continue to monitor as needed.

## 2015-03-26 NOTE — ED Notes (Signed)
Pt presents to department for evaluation of SOB. Ongoing x3 days. History of lung cancer. Respirations labored upon arrival to ED. 20g R hand. Pt is alert and oriented x4.

## 2015-03-26 NOTE — ED Provider Notes (Signed)
CSN: [id number]     Arrival date & time 03/26/15  1340 History   First MD Initiated Contact with Patient 03/26/15 1348     Chief Complaint  Patient presents with  . Shortness of Breath     (Consider location/radiation/quality/duration/timing/severity/associated sxs/prior Treatment) Patient is a 69 y.o. male presenting with shortness of breath. The history is provided by the patient.  Shortness of Breath Severity:  Moderate Onset quality:  Gradual Duration:  3 days Timing:  Constant Progression:  Worsening Chronicity:  New Context: activity   Context: not URI   Context comment:  Recent chemotherapy Relieved by:  Nothing Worsened by:  Nothing tried Ineffective treatments:  Diuretics Associated symptoms: cough   Associated symptoms: no abdominal pain, no fever and no hemoptysis   Risk factors: hx of cancer     Past Medical History  Diagnosis Date  . Myocardial infarction   . Ventricular tachycardia   . Diabetes mellitus   . COPD (chronic obstructive pulmonary disease)   . CAD (coronary artery disease) Dec 2012    s/p CABG x 3 with AVR; has normal EF  . Aortic stenosis Dec 2012    s/p AVR  . S/P ICD (internal cardiac defibrillator) procedure   . PTSD (post-traumatic stress disorder)   . HTN (hypertension)   . Tobacco abuse    Past Surgical History  Procedure Laterality Date  . Icd implant    . Nose surgery      skin cancer  . Cardiac catheterization    . Coronary angioplasty    . Tee without cardioversion  07/22/2011    Procedure: TRANSESOPHAGEAL ECHOCARDIOGRAM (TEE);  Surgeon: Lelon Perla, MD;  Location: [facility] ENDOSCOPY;  Service: Cardiovascular;  Laterality: N/A;  . Coronary artery bypass graft  07/23/2011    Procedure: CORONARY ARTERY BYPASS GRAFTING (CABG);  Surgeon: Rexene Alberts, MD;  Location: [place];  Service: Open Heart Surgery;  Laterality: N/A;  CABG x 3 using bilateral greater saphenous veins harvested endoscopically  . Aortic valve replacement   07/23/2011    Procedure: AORTIC VALVE REPLACEMENT (AVR);  Surgeon: Rexene Alberts, MD;  Location: [place];  Service: Open Heart Surgery;  Laterality: N/A;  . Left heart catheterization with coronary angiogram N/A 07/18/2011    Procedure: LEFT HEART CATHETERIZATION WITH CORONARY ANGIOGRAM;  Surgeon: Sherren Mocha, MD;  Location: [facility] CATH LAB;  Service: Cardiovascular;  Laterality: N/A;  . Fractional flow reserve wire N/A 07/18/2011    Procedure: FRACTIONAL FLOW RESERVE WIRE;  Surgeon: Sherren Mocha, MD;  Location: [facility] CATH LAB;  Service: Cardiovascular;  Laterality: N/A;   History reviewed. No pertinent family history. History  Substance Use Topics  . Smoking status: Former Smoker -- 1.00 packs/day    Quit date: 07/16/2011  . Smokeless tobacco: Not on file  . Alcohol Use: No    Review of Systems  Constitutional: Negative for fever.  Respiratory: Positive for cough and shortness of breath. Negative for hemoptysis.   Gastrointestinal: Negative for abdominal pain.  All other systems reviewed and are negative.     Allergies  Procaine hcl and Rosuvastatin  Home Medications   Prior to Admission medications   Medication Sig Start Date End Date Taking? Authorizing Provider  albuterol (PROVENTIL HFA;VENTOLIN HFA) 108 (90 BASE) MCG/ACT inhaler Inhale 2 puffs into the lungs every 6 (six) hours as needed. For shortness of breath    Historical Provider, MD  amoxicillin (AMOXIL) 500 MG capsule Take 4 caps ('2mg'$ ) 30 to 60 minutes prior to  dental procedure Patient not taking: Reported on 12/16/2014 09/25/11   Burtis Junes, NP  aspirin 81 MG tablet Take 81 mg by mouth daily.     Historical Provider, MD  atenolol (TENORMIN) 25 MG tablet Take 1 tablet (25 mg total) by mouth daily. 07/30/11 12/16/14  Wayne E Gold, PA-C  atorvastatin (LIPITOR) 80 MG tablet Take 40 mg by mouth daily.      Historical Provider, MD  cholecalciferol (D-VI-SOL) 400 UNIT/ML LIQD Take 400 Units by mouth daily.      Historical  Provider, MD  flunisolide (NASALIDE) 0.025 % SOLN Inhale 2 sprays into the lungs 2 (two) times daily.      Historical Provider, MD  gabapentin (NEURONTIN) 600 MG tablet Take 600 mg by mouth 3 (three) times daily as needed (pai).     Historical Provider, MD  insulin regular human CONCENTRATED (HUMULIN R) 500 UNIT/ML SOLN injection Inject 17-19 Units into the skin 2 (two) times daily with a meal. 19 units in the morning and 17 units at night    Historical Provider, MD  lisinopril (PRINIVIL,ZESTRIL) 10 MG tablet Take 1 tablet (10 mg total) by mouth daily. 09/04/11 12/16/14  Burtis Junes, NP  methocarbamol (ROBAXIN) 500 MG tablet Take 1 tablet (500 mg total) by mouth 4 (four) times daily. 02/15/15   Sable Feil, PA-C  PARoxetine (PAXIL) 20 MG tablet Take 10 mg by mouth daily as needed. For PTSD    Historical Provider, MD  polysaccharide iron (NIFEREX) 150 MG CAPS capsule Take 1 capsule (150 mg total) by mouth daily. Patient not taking: Reported on 12/16/2014 07/30/11   John Giovanni, PA-C  tamsulosin (FLOMAX) 0.4 MG CAPS capsule Take 0.4 mg by mouth daily.    Historical Provider, MD  traMADol (ULTRAM) 50 MG tablet Take 1 tablet (50 mg total) by mouth every 6 (six) hours as needed for moderate pain. 02/15/15   Sable Feil, PA-C   BP 120/58 mmHg  Pulse 88  Temp(Src) 97.6 F (36.4 C) (Oral)  Resp 22  SpO2 94% Physical Exam  Constitutional: He is oriented to person, place, and time. He appears well-developed and well-nourished. He appears ill. No distress.  HENT:  Head: Normocephalic and atraumatic.  Eyes: Conjunctivae are normal.  Neck: Neck supple. No tracheal deviation present.  Cardiovascular: Normal rate and regular rhythm.  Exam reveals distant heart sounds.   Pulmonary/Chest: Effort normal. No respiratory distress. He has rales (diffuse).  Abdominal: Soft. He exhibits no distension.  Musculoskeletal:  2+ bilateral pitting lower extremity edema  Neurological: He is alert and oriented to  person, place, and time.  Skin: Skin is dry. There is pallor.  Psychiatric: He has a normal mood and affect.    ED Course  Procedures (including critical care time) Labs Review Labs Reviewed  CBC WITH DIFFERENTIAL/PLATELET - Abnormal; Notable for the following:    WBC 13.1 (*)    RBC 3.93 (*)    Hemoglobin 11.1 (*)    HCT 35.9 (*)    RDW 21.8 (*)    Neutrophils Relative % 79 (*)    Neutro Abs 10.3 (*)    All other components within normal limits  BRAIN NATRIURETIC PEPTIDE - Abnormal; Notable for the following:    B Natriuretic Peptide 936.2 (*)    All other components within normal limits  COMPREHENSIVE METABOLIC PANEL - Abnormal; Notable for the following:    Chloride 95 (*)    Glucose, Bld 331 (*)    Calcium  8.0 (*)    Albumin 3.2 (*)    ALT 16 (*)    Alkaline Phosphatase 163 (*)    All other components within normal limits  I-STAT TROPOININ, ED    Imaging Review Dg Chest 2 View  03/26/2015   CLINICAL DATA:  Shortness of breath and cough for 3 days, history of hypertension diabetes COPD coronary artery disease and lung cancer, former smoker  EXAM: CHEST  2 VIEW  COMPARISON:  Multiple prior studies including 02/15/2015  FINDINGS: Cardiac enlargement stable. Cardiac defibrillator in unchanged position. Moderately severe diffuse interstitial prominence. More focal opacity in the right upper lobe and peripheral aspect of the middle and lower lobe. Small bilateral pleural effusions.  IMPRESSION: Findings most consistent with cardiogenic pulmonary edema, moderate to severe. More focal confluent opacities on the right likely reflect asymmetric pulmonary edema as well, although superimposed pneumonia not excluded.   Electronically Signed   By: Skipper Cliche M.D.   On: 03/26/2015 14:48   I independently viewed and interpreted the above radiology studies and agree with radiologist report.   EKG Interpretation   Date/Time:  Sunday March 26 2015 13:49:28 EDT Ventricular Rate:   97 PR Interval:  233 QRS Duration: 151 QT Interval:  422 QTC Calculation: 536 R Axis:   -104 Text Interpretation:  Sinus rhythm multiple PVCs Prolonged PR interval  Left bundle branch block multiple PVCs  New since previous tracing  Confirmed by Amberlyn Martinezgarcia MD, Quillian Quince ([id number]) on 03/26/2015 2:06:28 PM      MDM   Final diagnoses:  Acute systolic heart failure  Acute pulmonary edema  Chemotherapy adverse reaction, initial encounter  Malignant neoplasm of lung, unspecified laterality, unspecified part of lung   69 year old male with history of small cell lung cancer normally seen at the [place] presents with progressive dyspnea, weight gain, swelling in his legs and pulmonary edema on x-ray consistent with fluid overload in setting of new-onset heart failure following chemotherapy last on 7/20. BNP is elevated, no infectious symptoms, doubt pneumonia or other complaint feature at this time. Patient has cardiac disease previously status post CABG remotely and AVR so will require admission for diuresis, echo, and further monitoring and workup. Family medicine was consulted and accepted to their service, they will see the patient in the emergency room.    Leo Grosser, MD 03/26/15 [phone]

## 2015-03-26 NOTE — H&P (Signed)
[facility] Admission History and Physical Service Pager: [phone]  Patient name: Sean [name] Medical record number: [id number] Date of birth: 1946-01-21 Age: 69 y.o. Gender: male  Primary Care Provider: Pcp Not In System Consultants: none Code Status: DNR/DNI  Chief Complaint: short of breath  Assessment and Plan: Eesa Justiss is a 69 y.o. male presenting with shortness of breath . PMH is significant for metastatic small cell lung cancer, MI s/p 3v CABG, AS s/p AVR with ICD, COPD, T2DM, HTN, tobacco use  # CHF Exacerbation: EKG with multiple PVCs, abnormal ECG but none to compare since 2013. CXR with bilateral pulmonary edema, enlarged heart. CMP largely normal (mild alk phos elevation), CBC with WBC 13.1, hgb 11.1. i-stat troponin 0.02. BNP 936.2. S/p 43m IV lasix in ED with 700cc output - continue 842mIV lasix BID - foley with aggressive diuresis (uses I&O cath at home) - telemetry - strict I&O - daily weights - continue lisinopril - cycle troponins - hold atenolol in acute setting, plan switch to toprolol - will continue levaquin for 7 day course started as outpt (stop 8/10) - o2 to keep sat > 92%  # Small cell lung cancer: [facility]   # COPD: on home albuterol, symbicort - albuterol PRN  # T2DM: - moderate SSI - CBG qAC qHS - pt and wife request to give him his U500 insulin (0.1 mL) twice a day... Will add this as well at half dose (25 units)  # HTN: - continue lisinopril - hold atenolol  # HLD - continue atorvastatin   FEN/GI: diet carb mod/HH with 150045mluid restriction. Saline lock Prophylaxis: heparin sq  Disposition: admit  History of Present Illness: Sean [name] a 68 59o. male presenting with shortness of breath. SOB started acutely 4 days ago. He says the dyspnea is present even at rest. He normally does not get short of breath for so long. He has noticed some  increased swelling in his legs and thinks he may have gained a few pounds though does not check his weight everyday, he last weighed himself 232 lbs at home. He has been able to sleep lying flat. [facility] was given IV lasix and 2 doses of PO lasix, along with antibiotics. He says the oral lasix did not help him urinate at all. He continued to feel short of breath so he felt he needed to come to the ED. He denies any chest pains, nausea, vomiting, chills (but now feels a little clammy), no abdominal pain.   He says that he was diagnosed with metastatic small lung cell carcinoma, with mets to his lymph nodes, liver, and bone. He has gotten 3 rounds of chemotherapy and was due for a 4th starting tomorrow. He has been given a prognosis of 6-12 months with chemo.  Review Of Systems: Per HPI with the following additions: none Otherwise 12 point review of systems was performed and was unremarkable.  Patient Active Problem List   Diagnosis Date Noted  . Fall 04/24/2012  . Lumbar compression fracture 04/24/2012  . HTN (hypertension) 08/21/2011  . High risk medication use 08/21/2011  . Atrial fibrillation 07/30/2011  . S/P CABG x 3 07/25/2011  . S/P AVR (aortic valve replacement) 07/25/2011  . Ventricular fibrillation 07/18/2011  . CAD (coronary artery disease) 07/16/2011  . Congestive heart failure 07/16/2011  . Diabetes mellitus 07/16/2011  .  COPD (chronic obstructive pulmonary disease) 07/16/2011   Past Medical History: Past Medical History  Diagnosis Date  . Myocardial infarction   . Ventricular tachycardia   . Diabetes mellitus   . COPD (chronic obstructive pulmonary disease)   . CAD (coronary artery disease) Dec 2012    s/p CABG x 3 with AVR; has normal EF  . Aortic stenosis Dec 2012    s/p AVR  . S/P ICD (internal cardiac defibrillator) procedure   . PTSD (post-traumatic stress disorder)   . HTN (hypertension)   . Tobacco abuse    Past  Surgical History: Past Surgical History  Procedure Laterality Date  . Icd implant    . Nose surgery      skin cancer  . Cardiac catheterization    . Coronary angioplasty    . Tee without cardioversion  07/22/2011    Procedure: TRANSESOPHAGEAL ECHOCARDIOGRAM (TEE);  Surgeon: Lelon Perla, MD;  Location: [facility] ENDOSCOPY;  Service: Cardiovascular;  Laterality: N/A;  . Coronary artery bypass graft  07/23/2011    Procedure: CORONARY ARTERY BYPASS GRAFTING (CABG);  Surgeon: Rexene Alberts, MD;  Location: [place];  Service: Open Heart Surgery;  Laterality: N/A;  CABG x 3 using bilateral greater saphenous veins harvested endoscopically  . Aortic valve replacement  07/23/2011    Procedure: AORTIC VALVE REPLACEMENT (AVR);  Surgeon: Rexene Alberts, MD;  Location: [place];  Service: Open Heart Surgery;  Laterality: N/A;  . Left heart catheterization with coronary angiogram N/A 07/18/2011    Procedure: LEFT HEART CATHETERIZATION WITH CORONARY ANGIOGRAM;  Surgeon: Sherren Mocha, MD;  Location: [facility] CATH LAB;  Service: Cardiovascular;  Laterality: N/A;  . Fractional flow reserve wire N/A 07/18/2011    Procedure: FRACTIONAL FLOW RESERVE WIRE;  Surgeon: Sherren Mocha, MD;  Location: [facility] CATH LAB;  Service: Cardiovascular;  Laterality: N/A;   Social History: History  Substance Use Topics  . Smoking status: Former Smoker -- 1.00 packs/day    Quit date: 07/16/2011  . Smokeless tobacco: Not on file  . Alcohol Use: No   Additional social history: none  Please also refer to relevant sections of EMR.  Family History: History reviewed. No pertinent family history. Allergies and Medications: Allergies  Allergen Reactions  . Procaine Hcl Other (See Comments)    unknown  . Rosuvastatin Other (See Comments)    Muscle pain   No current facility-administered medications on file prior to encounter.   Current Outpatient Prescriptions on File Prior to Encounter  Medication Sig Dispense Refill  . albuterol  (PROVENTIL HFA;VENTOLIN HFA) 108 (90 BASE) MCG/ACT inhaler Inhale 1-2 puffs into the lungs every 6 (six) hours as needed for shortness of breath.     Marland Kitchen aspirin 81 MG tablet Take 81 mg by mouth daily.     Marland Kitchen atorvastatin (LIPITOR) 80 MG tablet Take 80 mg by mouth every evening.     . gabapentin (NEURONTIN) 600 MG tablet Take 600 mg by mouth 3 (three) times daily.     . insulin regular human CONCENTRATED (HUMULIN R) 500 UNIT/ML SOLN injection Inject 0.1 Units into the skin 2 (two) times daily with a meal.     . PARoxetine (PAXIL) 20 MG tablet Take 10 mg by mouth daily.     . tamsulosin (FLOMAX) 0.4 MG CAPS capsule Take 0.4 mg by mouth daily after supper.     Marland Kitchen amoxicillin (AMOXIL) 500 MG capsule Take 4 caps ($Remov'2mg'agfjPc$ ) 30 to 60 minutes prior to dental procedure (Patient not taking: Reported on  12/16/2014) 20 capsule 0  . atenolol (TENORMIN) 25 MG tablet Take 1 tablet (25 mg total) by mouth daily. 30 tablet 1  . lisinopril (PRINIVIL,ZESTRIL) 10 MG tablet Take 1 tablet (10 mg total) by mouth daily. 30 tablet 11  . methocarbamol (ROBAXIN) 500 MG tablet Take 1 tablet (500 mg total) by mouth 4 (four) times daily. (Patient not taking: Reported on 03/26/2015) 20 tablet 0  . polysaccharide iron (NIFEREX) 150 MG CAPS capsule Take 1 capsule (150 mg total) by mouth daily. (Patient not taking: Reported on 12/16/2014) 30 each 1  . traMADol (ULTRAM) 50 MG tablet Take 1 tablet (50 mg total) by mouth every 6 (six) hours as needed for moderate pain. (Patient not taking: Reported on 03/26/2015) 12 tablet 0    Objective: BP 100/68 mmHg  Pulse 82  Temp(Src) 97.6 F (36.4 C) (Oral)  Resp 20  Ht _0  (1.727 m)  Wt 228 lb (103.42 kg)  BMI 34.68 kg/m2  SpO2 94% Exam: General: mild distress with breathing but able to hold conversation Eyes: PERRL, EOMI. ENTM: MMM. Edentulous. No oropharyngeal lesions Neck: supple, JVD noted Cardiovascular: RRR, normal heart sounds, no murmur appreciated. 2+ radial, DP pulses bilaterally. 2+  pitting edema up to shins bilaterally, right slightly worse than left Respiratory: expiratory wheezes present. Crackles at both bases and right middle lung. Increased work of breathing. Abdomen: obese, soft, NTND, normal bowel sounds. MSK: normal bulk/tone, no deformities noted. Skin: no rashes Neuro: alert and oriented x 3, no focal deficits Psych: normal thought content and speech.  Labs and Imaging: CBC BMET   Recent Labs Lab 03/26/15 1413  WBC 13.1*  HGB 11.1*  HCT 35.9*  PLT 253    Recent Labs Lab 03/26/15 1413  NA 135  K 4.0  CL 95*  CO2 29  BUN 11  CREATININE 0.92  GLUCOSE 331*  CALCIUM 8.0*     Dg Chest 2 View  03/26/2015   CLINICAL DATA:  Shortness of breath and cough for 3 days, history of hypertension diabetes COPD coronary artery disease and lung cancer, former smoker  EXAM: CHEST  2 VIEW  COMPARISON:  Multiple prior studies including 02/15/2015  FINDINGS: Cardiac enlargement stable. Cardiac defibrillator in unchanged position. Moderately severe diffuse interstitial prominence. More focal opacity in the right upper lobe and peripheral aspect of the middle and lower lobe. Small bilateral pleural effusions.  IMPRESSION: Findings most consistent with cardiogenic pulmonary edema, moderate to severe. More focal confluent opacities on the right likely reflect asymmetric pulmonary edema as well, although superimposed pneumonia not excluded.   Electronically Signed   By: Skipper Cliche M.D.   On: 03/26/2015 14:48     Leone Brand, MD 03/26/2015, 4:19 PM PGY-3, [place] Intern pager: [phone], text pages welcome

## 2015-03-26 NOTE — ED Notes (Signed)
Pt reports SOB x3 days, increases on exertion, audible wheezing noted upon arrival to ED. Denies chest pain. Respirations labored at present. Scheduled to start 4th round of chemotherapy tomorrow. No relief from inhaler at home. Pt is alert and oriented x4.

## 2015-03-27 ENCOUNTER — Inpatient Hospital Stay (HOSPITAL_COMMUNITY): Payer: Non-veteran care

## 2015-03-27 DIAGNOSIS — C3490 Malignant neoplasm of unspecified part of unspecified bronchus or lung: Secondary | ICD-10-CM | POA: Insufficient documentation

## 2015-03-27 DIAGNOSIS — E1159 Type 2 diabetes mellitus with other circulatory complications: Secondary | ICD-10-CM

## 2015-03-27 DIAGNOSIS — J810 Acute pulmonary edema: Secondary | ICD-10-CM

## 2015-03-27 DIAGNOSIS — I509 Heart failure, unspecified: Secondary | ICD-10-CM

## 2015-03-27 DIAGNOSIS — T451X5A Adverse effect of antineoplastic and immunosuppressive drugs, initial encounter: Secondary | ICD-10-CM

## 2015-03-27 DIAGNOSIS — I10 Essential (primary) hypertension: Secondary | ICD-10-CM

## 2015-03-27 DIAGNOSIS — I25709 Atherosclerosis of coronary artery bypass graft(s), unspecified, with unspecified angina pectoris: Secondary | ICD-10-CM | POA: Insufficient documentation

## 2015-03-27 LAB — BASIC METABOLIC PANEL
ANION GAP: 12 (ref 5–15)
BUN: 12 mg/dL (ref 6–20)
CO2: 35 mmol/L — ABNORMAL HIGH (ref 22–32)
CREATININE: 1.07 mg/dL (ref 0.61–1.24)
Calcium: 7.6 mg/dL — ABNORMAL LOW (ref 8.9–10.3)
Chloride: 90 mmol/L — ABNORMAL LOW (ref 101–111)
GFR calc Af Amer: >60 mL/min (ref >60)
GFR calc non Af Amer: >60 mL/min (ref >60)
Glucose, Bld: 264 mg/dL — ABNORMAL HIGH (ref 65–99)
Potassium: 3.8 mmol/L (ref 3.5–5.1)
Sodium: 137 mmol/L (ref 135–145)

## 2015-03-27 LAB — GLUCOSE, CAPILLARY
GLUCOSE-CAPILLARY: 298 mg/dL — AB (ref 65–99)
GLUCOSE-CAPILLARY: 250 mg/dL — AB (ref 65–99)
Glucose-Capillary: 247 mg/dL — ABNORMAL HIGH (ref 65–99)
Glucose-Capillary: 233 mg/dL — ABNORMAL HIGH (ref 65–99)

## 2015-03-27 LAB — TROPONIN I
TROPONIN I: 0.1 ng/mL — AB (ref <0.031)
Troponin I: 0.09 ng/mL — ABNORMAL HIGH (ref <0.031)

## 2015-03-27 MED ORDER — FUROSEMIDE 40 MG PO TABS
40.0000 mg | ORAL_TABLET | Freq: Two times a day (BID) | ORAL | Status: DC
  Administered 2015-03-27 – 2015-03-28 (×2): 40 mg via ORAL
  Filled 2015-03-27 (×4): qty 1

## 2015-03-27 MED ORDER — POTASSIUM CHLORIDE CRYS ER 20 MEQ PO TBCR
20.0000 meq | EXTENDED_RELEASE_TABLET | Freq: Once | ORAL | Status: AC
  Administered 2015-03-27: 20 meq via ORAL

## 2015-03-27 MED ORDER — PERFLUTREN LIPID MICROSPHERE
1.0000 mL | INTRAVENOUS | Status: AC | PRN
  Administered 2015-03-27: 2 mL via INTRAVENOUS
  Filled 2015-03-27: qty 10

## 2015-03-27 NOTE — Progress Notes (Signed)
Paged Family Medicine regarding family concerns regarding paying for stay if [facility]. Gave Dr. Avon Gully room number to call into talk to family.   Maurene Capes RN

## 2015-03-27 NOTE — Progress Notes (Signed)
  Echocardiogram 2D Echocardiogram has been performed.  Sean [name] 03/27/2015, 3:59 PM

## 2015-03-27 NOTE — Progress Notes (Signed)
Spoke with patient and his family, including his wife and sister-in-law, around 45.   Family concerns include ability to pay for hospital stay, as patient is a veteran with Medicare only. The would like someone to follow-up with the [facility] to see if he needs to [facility] to their facilities to avoid a hospital bill they cannot afford. They state he had to come here because "he would not have made it" traveling further for care. Today (8/8) around 1500 would have been the 24-hr cutoff for the family to transfer without penalty with his insurance coverage. They are very anxious for someone to follow-up with the [place] and were very disappointed this did not happen already.  Patient's wife brought information about his chemotherapy regimen. He receives 3 days of treatment every 21 days. He had his first round June 6th, 2016. He was due to start his 4th round today, 03/27/15. He has been receiving carboplatin and etoposide. Wife provided oncologist's contact information: Dr. Sabra Heck. (T) [phone]. [id number] and [phone]. They would like team to discuss new diagnosis of CHF with Dr. Sabra Heck and discuss whether chemotherapy needs to be altered. They would like to know if chemo could have caused or contributed to his heart failure.  Patient also informed this provider that he is DNR and would need his defibrillator turned off if he reaches that point.   Family is eager to hear cardiology's recommendations when available.   Wife's number is [phone] (cell) and [phone] (home).

## 2015-03-27 NOTE — Progress Notes (Signed)
Family Medicine Teaching Service Daily Progress Note Intern Pager: [phone]  Patient name: Sean [name] Medical record number: [id number] Date of birth: April 25, 1946 Age: 69 y.o. Gender: male  Primary Care Provider: Pcp Not In System Consultants: none Code Status: DNR/DNI  Chief Complaint: short of breath  Assessment and Plan: Sean [name] is a 69 y.o. male presenting with shortness of breath . PMH is significant for metastatic small cell lung cancer, MI s/p 3v CABG, AS s/p AVR with ICD, COPD, T2DM, HTN, tobacco use  # CHF Exacerbation: EKG with multiple PVCs. CXR with bilateral pulmonary edema, enlarged heart. CMP largely normal (mild alk phos elevation), CBC with WBC 13.1, hgb 11.1. BNP 936.2. Echo showed decreased EF of 25-30%. Diuresed 2.8L yesterday (08/08) - strict I&O, daily weights - continue lisinopril - hold atenolol in acute setting, plan switch to toprolol upon discharge  - O2 to keep sat > 92% - Cont PO Lasix 76m BID  - Per cardiology, important to drink 48-64 oz daily to help protect renal function with chemo agents. Also continue dietary salt restrictions   # Small cell lung cancer: was set to start 4th round of chemo with the [facility]8/8. Taking etoposide and carboplatin.  - Will likely need adjustment of doses or chemo medication regimen - Have contacted his oncologist (Dr. MSabra Heck   # COPD: on home albuterol, symbicort - albuterol PRN  # T2DM: cont insulin 25U BID with meals  - moderate SSI - CBG qAC qHS  # HTN: - Continue lisinopril - Begin metoprolol succinate 25 mg qd on discharge   # HLD - continue atorvastatin  FEN/GI: diet carb mod/HH with 15052mfluid restriction. Saline lock Prophylaxis: heparin sq  Disposition: home pending medical improvement   Subjective:  Sean [name] that he feels much better this morning. He says he is ready to go home, and is going to be much strict with dietary salt restrictions and liquid restriction.    Objective: Temp:  [97.3 F (36.3 C)-97.8 F (36.6 C)] 97.3 F (36.3 C) (08/09 0909) Pulse Rate:  [85-96] 93 (08/09 0909) Resp:  [20-22] 20 (08/09 0909) BP: (95-116)/(47-66) 98/47 mmHg (08/09 0909) SpO2:  [92 %-96 %] 93 % (08/09 0909) Physical Exam: General: pleasant man sitting on side of bed eating breakfast in NAD Cardiovascular: RRR, no murmurs appreciated Respiratory: CTAB, no wheezes, no crackles  Abdomen: obese, soft, non-tender, non-distended, +BS Neuro: A&Ox3, no focal deficits Psych: appropriate mood and affect   Laboratory:  Recent Labs Lab 03/26/15 1413  WBC 13.1*  HGB 11.1*  HCT 35.9*  PLT 253    Recent Labs Lab 03/26/15 1413 03/27/15 0612 03/28/15 0437  NA 135 137 135  K 4.0 3.8 3.4*  CL 95* 90* 91*  CO2 29 35* 36*  BUN 11 12 15   CREATININE 0.92 1.07 1.07  CALCIUM 8.0* 7.6* 7.1*  PROT 7.7  --   --   BILITOT 0.5  --   --   ALKPHOS 163*  --   --   ALT 16*  --   --   AST 25  --   --   GLUCOSE 331* 264* 314*    Imaging/Diagnostic Tests: Dg Chest 2 View  03/26/2015   CLINICAL DATA:  Shortness of breath and cough for 3 days, history of hypertension diabetes COPD coronary artery disease and lung cancer, former smoker  EXAM: CHEST  2 VIEW  COMPARISON:  Multiple prior studies including 02/15/2015  FINDINGS: Cardiac enlargement stable. Cardiac defibrillator in unchanged position.  Moderately severe diffuse interstitial prominence. More focal opacity in the right upper lobe and peripheral aspect of the middle and lower lobe. Small bilateral pleural effusions.  IMPRESSION: Findings most consistent with cardiogenic pulmonary edema, moderate to severe. More focal confluent opacities on the right likely reflect asymmetric pulmonary edema as well, although superimposed pneumonia not excluded.   Electronically Signed   By: Skipper Cliche M.D.   On: 03/26/2015 14:48    Verner Mould, MD 03/28/2015, 1:04 PM PGY-1, [place] Intern  pager: [phone], text pages welcome

## 2015-03-27 NOTE — Progress Notes (Signed)
RT entered room to place patient on CPAP, and patient is refusing to wear at this time. RT informed patient if he changes his mind have RN contact RT.

## 2015-03-27 NOTE — Progress Notes (Signed)
Family Medicine Teaching Service Daily Progress Note Intern Pager: [phone]  Patient name: Sean [name] Medical record number: [id number] Date of birth: 1946/07/14 Age: 69 y.o. Gender: male  Primary Care Provider: Pcp Not In System Consultants: none Code Status: DNR/DNI  Chief Complaint: short of breath  Assessment and Plan: Callaway Hailes is a 69 y.o. male presenting with shortness of breath . PMH is significant for metastatic small cell lung cancer, MI s/p 3v CABG, AS s/p AVR with ICD, COPD, T2DM, HTN, tobacco use  # CHF Exacerbation: EKG with multiple PVCs, abnormal ECG but none to compare since 2013. CXR with bilateral pulmonary edema, enlarged heart. CMP largely normal (mild alk phos elevation), CBC with WBC 13.1, hgb 11.1. BNP 936.2.  - foley with aggressive diuresis (uses I&O cath at home) - strict I&O, daily weights - continue lisinopril - hold atenolol in acute setting, plan switch to toprolol upon discharge  - discontinue levaquin - o2 to keep sat > 92% - Transition to PO Lasix 29m BID  - Echo read pending  # Small cell lung cancer: was set to start 4th round of chemo with the [facility]8/8.  - Wife to bring list of chemo meds today; see if potential correlation with current issues   # COPD: on home albuterol, symbicort - albuterol PRN  # T2DM: cont insulin 25U BID with meals  - moderate SSI - CBG qAC qHS  # HTN: - continue lisinopril - hold atenolol  # HLD - continue atorvastatin  FEN/GI: diet carb mod/HH with 1509mfluid restriction. Saline lock Prophylaxis: heparin sq  Disposition: home pending medical improvement   Subjective:  Mr. KnReithereports that he is feeling much better this morning. He said that he feels the IV Lasix is working very well, and his breathing has improved.   Objective: Temp:  [97.6 F (36.4 C)-97.9 F (36.6 C)] 97.9 F (36.6 C) (08/08 0601) Pulse Rate:  [84-90] 85 (08/08 1007) Resp:  [20-26] 20 (08/08 1007) BP: (93-113)/(54-69)  101/54 mmHg (08/08 1007) SpO2:  [92 %-95 %] 93 % (08/08 1007) Weight:  [225 lb 6.4 oz (102.241 kg)-233 lb 6.4 oz (105.87 kg)] 225 lb 6.4 oz (102.241 kg) (08/08 0601) Physical Exam: General: pleasant man sitting on side of bed in NAD Cardiovascular: RRR, no murmurs appreciated Respiratory: CTAB, no wheezes, no crackles  Abdomen: obese, soft, non-tender, non-distended, +BS Neuro: A&Ox3, no focal deficits Psych: appropriate mood and affect   Laboratory:  Recent Labs Lab 03/26/15 1413  WBC 13.1*  HGB 11.1*  HCT 35.9*  PLT 253    Recent Labs Lab 03/26/15 1413 03/27/15 0612  NA 135 137  K 4.0 3.8  CL 95* 90*  CO2 29 35*  BUN 11 12  CREATININE 0.92 1.07  CALCIUM 8.0* 7.6*  PROT 7.7  --   BILITOT 0.5  --   ALKPHOS 163*  --   ALT 16*  --   AST 25  --   GLUCOSE 331* 264*    Imaging/Diagnostic Tests: Dg Chest 2 View  03/26/2015   CLINICAL DATA:  Shortness of breath and cough for 3 days, history of hypertension diabetes COPD coronary artery disease and lung cancer, former smoker  EXAM: CHEST  2 VIEW  COMPARISON:  Multiple prior studies including 02/15/2015  FINDINGS: Cardiac enlargement stable. Cardiac defibrillator in unchanged position. Moderately severe diffuse interstitial prominence. More focal opacity in the right upper lobe and peripheral aspect of the middle and lower lobe. Small bilateral pleural effusions.  IMPRESSION: Findings  most consistent with cardiogenic pulmonary edema, moderate to severe. More focal confluent opacities on the right likely reflect asymmetric pulmonary edema as well, although superimposed pneumonia not excluded.   Electronically Signed   By: Skipper Cliche M.D.   On: 03/26/2015 14:48    Verner Mould, MD 03/27/2015, 4:03 PM PGY-1, [place] Intern pager: [phone], text pages welcome

## 2015-03-28 DIAGNOSIS — C3400 Malignant neoplasm of unspecified main bronchus: Secondary | ICD-10-CM

## 2015-03-28 DIAGNOSIS — I5021 Acute systolic (congestive) heart failure: Principal | ICD-10-CM

## 2015-03-28 LAB — GLUCOSE, CAPILLARY
GLUCOSE-CAPILLARY: 274 mg/dL — AB (ref 65–99)
Glucose-Capillary: 276 mg/dL — ABNORMAL HIGH (ref 65–99)

## 2015-03-28 LAB — BASIC METABOLIC PANEL
ANION GAP: 8 (ref 5–15)
BUN: 15 mg/dL (ref 6–20)
CHLORIDE: 91 mmol/L — AB (ref 101–111)
CO2: 36 mmol/L — AB (ref 22–32)
CREATININE: 1.07 mg/dL (ref 0.61–1.24)
Calcium: 7.1 mg/dL — ABNORMAL LOW (ref 8.9–10.3)
GFR calc Af Amer: >60 mL/min (ref >60)
GFR calc non Af Amer: >60 mL/min (ref >60)
GLUCOSE: 314 mg/dL — AB (ref 65–99)
Potassium: 3.4 mmol/L — ABNORMAL LOW (ref 3.5–5.1)
Sodium: 135 mmol/L (ref 135–145)

## 2015-03-28 MED ORDER — METOPROLOL SUCCINATE ER 25 MG PO TB24: 25.0000 mg | ORAL_TABLET | Freq: Every day | ORAL | Status: AC

## 2015-03-28 MED ORDER — LISINOPRIL 10 MG PO TABS: 10.0000 mg | ORAL_TABLET | Freq: Every day | ORAL | Status: DC

## 2015-03-28 MED ORDER — FUROSEMIDE 40 MG PO TABS: 40.0000 mg | ORAL_TABLET | Freq: Two times a day (BID) | ORAL | Status: DC

## 2015-03-28 NOTE — Progress Notes (Signed)
Pt has orders to be discharged. Discharge instructions given and pt has no additional questions at this time. Medication regimen reviewed and pt educated. Pt verbalized understanding and has no additional questions. Telemetry box removed. IV removed and site in good condition. Pt stable and waiting for transportation.   Omar Gayden RN 

## 2015-03-28 NOTE — Progress Notes (Signed)
CM CONSULT  Patient had questions about the [facility] paying [facility] bill. CM informed pt / spouse that his primary insurance listed in Epic was medicare and not the [place]. Patient's spouse became verbally abusive, " I told them in the ER to call the [place] to let them know that he was here but nobody gives a sh**, he served in this country and you guys treat him this way." CM attempted to inform the pt/ spouse that I do not have control of what happened in the ED and that she would have to call Admitting to have his insurance changed to the [place] as primary. Spouse is very angry stated "this happened last time."  Spouse called admitting to have Insurance changed to the [place] as primary. CM called the [facility] , talked to [facility] RN in IT trainer. Joey stated that they knew that he was at [facility] because his spouse already called them. CM informed Joey that they had lots of questions about the [facility] coverage. Joey talked to the spouse via phone to answer all of their questions. No further questions at this time, spouse is quiet. Mindi Slicker [facility] [phone]

## 2015-03-28 NOTE — Discharge Summary (Signed)
[facility] Discharge Summary  Patient name: Sean [name] Medical record number: [id number] Date of birth: [birth year] Age: 69 y.o. Gender: male Date of Admission: 03/26/2015  Date of Discharge: 03/28/15 Admitting Physician: Lupita Dawn, MD  Primary Care Provider: Pcp Not In System Consultants: Cardiology  Indication for Hospitalization: shortness of breath  Discharge Diagnoses/Problem List:  Patient Active Problem List   Diagnosis Date Noted  . Acute pulmonary edema   . Chemotherapy adverse reaction   . Lung cancer   . Essential hypertension   . Coronary artery disease involving coronary bypass graft of native heart with unspecified angina pectoris   . Type 2 diabetes mellitus with other circulatory complications   . Acute heart failure 03/26/2015  . Acute exacerbation of CHF (congestive heart failure) 03/26/2015  . Fall 04/24/2012  . Lumbar compression fracture 04/24/2012  . HTN (hypertension) 08/21/2011  . High risk medication use 08/21/2011  . Atrial fibrillation 07/30/2011  . S/P CABG x 3 07/25/2011  . S/P AVR (aortic valve replacement) 07/25/2011  . Ventricular fibrillation 07/18/2011  . CAD (coronary artery disease) 07/16/2011  . Congestive heart failure 07/16/2011  . Diabetes mellitus 07/16/2011  . COPD (chronic obstructive pulmonary disease) 07/16/2011     Disposition: home  Discharge Condition: stable  Discharge Exam:  General: pleasant man sitting on side of bed eating breakfast in NAD Cardiovascular: RRR, no murmurs appreciated Respiratory: CTAB, no wheezes, no crackles  Abdomen: obese, soft, non-tender, non-distended, +BS Neuro: A&Ox3, no focal deficits Psych: appropriate mood and affect   Brief Hospital Course:  Mr. Rowe presented with SOB x 4 days, leg swelling, and weight gain. He was given IV Lasix, and subsequently diuresed ~ 6L during hospital course. Once SOB and edema improved, he was transition to PO Lasix,  with continued improvement of symptoms.  Echo showed decreased EF of 25-30%, so cardiology was consulted. They agreed with Lasix dosing, and educated the patient about salt restriction, daily weights, and other aspects of CHF.  Given his symptomatic improvement, the patient was discharged.   Issues for Follow Up:  1. Atenolol was discontinued. Patient was instead discharged with metoprolol succinate 25 mg qd.  2. Patient instructed to weigh himself daily for fluid monitoring. He was also encouraged to drink 48-64 oz of water daily and restrict salt intake.  3. Started PO Lasix 40 mg BID. Patient was also instructed to take an addition 40 mg dose if he gained 3-4 pounds. 4. Patient was scheduled to receive four round of chemo on the second day of admission, but was unable to make that appointment. He may needs dose adjustments or medication changes in chemo regimen given new CHF. Patient will follow-up with his oncologist Dr. Sabra Heck within one week.   5. Patient is DNR but still has ICD in place. Given 6-12 mo mortality with metastatic lung CA diagnosis, consider discussion with [facility] EP concerning turning off the device.  6. Echo obtained showed EF of 25-30%. Patient has seen Dr. Acie Fredrickson in the past, and will likely need to schedule cardiology follow-up soon.   Significant Procedures: None  Significant Labs and Imaging:  BNP - 936 CXR - bilateral pulmonary edema, cardiomegaly EKG - multiple PVCs Echo - EF 25-30%  Recent Labs Lab 03/26/15 1413  WBC 13.1*  HGB 11.1*  HCT 35.9*  PLT 253    Recent Labs Lab 03/26/15 1413 03/27/15 0612 03/28/15 0437  NA 135 137 135  K 4.0 3.8 3.4*  CL 95* 90* 91*  CO2 29 35* 36*  GLUCOSE 331* 264* 314*  BUN '11 12 15  '$ CREATININE 0.92 1.07 1.07  CALCIUM 8.0* 7.6* 7.1*  ALKPHOS 163*  --   --   AST 25  --   --   ALT 16*  --   --   ALBUMIN 3.2*  --   --     Results/Tests Pending at Time of Discharge: None  Discharge Medications:    Medication  List    STOP taking these medications        atenolol 25 MG tablet  Commonly known as:  TENORMIN     atenolol 50 MG tablet  Commonly known as:  TENORMIN     levofloxacin 500 MG tablet  Commonly known as:  LEVAQUIN      TAKE these medications        albuterol 108 (90 BASE) MCG/ACT inhaler  Commonly known as:  PROVENTIL HFA;VENTOLIN HFA  Inhale 1-2 puffs into the lungs every 6 (six) hours as needed for shortness of breath.     aspirin 81 MG tablet  Take 81 mg by mouth daily.     atorvastatin 80 MG tablet  Commonly known as:  LIPITOR  Take 80 mg by mouth every evening.     furosemide 40 MG tablet  Commonly known as:  LASIX  Take 1 tablet (40 mg total) by mouth 2 (two) times daily.     gabapentin 600 MG tablet  Commonly known as:  NEURONTIN  Take 600 mg by mouth 3 (three) times daily.     insulin regular human CONCENTRATED 500 UNIT/ML injection  Commonly known as:  HUMULIN R  Inject 0.1 Units into the skin 2 (two) times daily with a meal.     lisinopril 10 MG tablet  Commonly known as:  PRINIVIL,ZESTRIL  Take 1 tablet (10 mg total) by mouth daily.     methocarbamol 500 MG tablet  Commonly known as:  ROBAXIN  Take 1 tablet (500 mg total) by mouth 4 (four) times daily.     metoprolol succinate 25 MG 24 hr tablet  Commonly known as:  TOPROL XL  Take 1 tablet (25 mg total) by mouth daily.     omeprazole 20 MG capsule  Commonly known as:  PRILOSEC  Take 20 mg by mouth every evening.     ondansetron 8 MG tablet  Commonly known as:  ZOFRAN  Take 8 mg by mouth every 8 (eight) hours as needed for nausea or vomiting.     oxyCODONE 5 MG immediate release tablet  Commonly known as:  Oxy IR/ROXICODONE  Take 5 mg by mouth every 6 (six) hours as needed for severe pain (pain).     PARoxetine 20 MG tablet  Commonly known as:  PAXIL  Take 10 mg by mouth daily.     polysaccharide iron 150 MG capsule  Generic drug:  iron polysaccharides  Take 1 capsule (150 mg total) by  mouth daily.     tamsulosin 0.4 MG Caps capsule  Commonly known as:  FLOMAX  Take 0.4 mg by mouth daily after supper.     traMADol 50 MG tablet  Commonly known as:  ULTRAM  Take 1 tablet (50 mg total) by mouth every 6 (six) hours as needed for moderate pain.     zolpidem 10 MG tablet  Commonly known as:  AMBIEN  Take 5 mg by mouth at bedtime.        Discharge Instructions: Please refer to Patient Instructions  section of EMR for full details.  Patient was counseled important signs and symptoms that should prompt return to medical care, changes in medications, dietary instructions, activity restrictions, and follow up appointments.   Follow-Up Appointments: Follow-up Information    Follow up with MILLER,ANTONIUS, MD In 1 week.   Specialty:  Internal Medicine   Why:  For hospital follow-up      Follow up with [facility], MD In 1 week.   Specialty:  Internal Medicine   Why:  For hospital follow-up   Contact information:   [address] [address] [phone]       Verner Mould, MD 03/28/2015, 5:12 PM PGY-1, [place]

## 2015-03-28 NOTE — Consult Note (Signed)
Admit date: 03/26/2015 Referring Physician  Dr. Ree Kida Primary Physician Pcp Not In System Primary Cardiologist  Dr. 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 Sabra Heck [facility] oncology, with past history of coronary artery disease status post three-vessel CABG, aortic stenosis status post bioprosthetic aortic valve replacement, ICD post ventricular fibrillatory arrest in 2008, diabetes, COPD, hypertension, prior tobacco use who presented with worsening dyspnea over the past several days, orthopnea associated with newly discovered decrease in his ejection fraction from 50-55% down to approximately 30%. Acute systolic heart failure. He also has concomitant low level elevated troponin likely demand ischemia. He has been treated with IV Lasix, continuing with ACE inhibitor and currently beta blocker, atenolol is on hold in this acute setting with plans to switch to Toprol. Lasix dose 80 mg IV twice a day.  Yesterday he underwent echocardiogram which demonstrated ejection fraction of 25-30%.  Family is concerned that his recent diagnosis or chemotherapy contribute to his current decrease in ejection fraction.  I do not have [place] records with most recent echocardiogram.  He does not recall having an echocardiogram surrounding his chemotherapy.  Currently he is feeling much improved, he states 100% better after receiving IV Lasix.  Denies any further, chest pain, orthopnea, PND    PMH:   Past Medical History  Diagnosis Date  . Myocardial infarction   . Ventricular tachycardia   . Diabetes mellitus   . COPD (chronic obstructive pulmonary disease)   . CAD (coronary artery disease) Dec 2012    s/p CABG x 3 with AVR; has normal EF  . Aortic stenosis Dec 2012    s/p  AVR  . S/P ICD (internal cardiac defibrillator) procedure   . PTSD (post-traumatic stress disorder)   . HTN (hypertension)   . Tobacco abuse     PSH:   Past Surgical History  Procedure Laterality Date  . Icd implant    . Nose surgery      skin cancer  . Cardiac catheterization    . Coronary angioplasty    . Tee without cardioversion  07/22/2011    Procedure: TRANSESOPHAGEAL ECHOCARDIOGRAM (TEE);  Surgeon: Lelon Perla, MD;  Location: [facility] ENDOSCOPY;  Service: Cardiovascular;  Laterality: N/A;  . Coronary artery bypass graft  07/23/2011    Procedure: CORONARY ARTERY BYPASS GRAFTING (CABG);  Surgeon: Rexene Alberts, MD;  Location: [place];  Service: Open Heart Surgery;  Laterality: N/A;  CABG x 3 using bilateral greater saphenous veins harvested endoscopically  . Aortic valve replacement  07/23/2011    Procedure: AORTIC VALVE REPLACEMENT (AVR);  Surgeon: Rexene Alberts, MD;  Location: [place];  Service: Open Heart Surgery;  Laterality: N/A;  . Left heart catheterization with coronary angiogram N/A 07/18/2011    Procedure: LEFT HEART CATHETERIZATION WITH CORONARY ANGIOGRAM;  Surgeon: Sherren Mocha, MD;  Location: [facility] CATH LAB;  Service: Cardiovascular;  Laterality: N/A;  . Fractional flow reserve wire N/A 07/18/2011    Procedure: FRACTIONAL FLOW RESERVE WIRE;  Surgeon: Sherren Mocha, MD;  Location: [facility];  Service: Cardiovascular;  Laterality: N/A;   Allergies:  Procaine hcl and Rosuvastatin Prior to Admit Meds:   Prior to Admission medications   Medication Sig Start Date End Date Taking? Authorizing Provider  albuterol (PROVENTIL HFA;VENTOLIN HFA) 108 (90 BASE) MCG/ACT  inhaler Inhale 1-2 puffs into the lungs every 6 (six) hours as needed for shortness of breath.    Yes Historical Provider, MD  aspirin 81 MG tablet Take 81 mg by mouth daily.    Yes Historical Provider, MD  atenolol (TENORMIN) 50 MG tablet Take 25 mg by mouth daily.   Yes Historical Provider, MD  atorvastatin  (LIPITOR) 80 MG tablet Take 80 mg by mouth every evening.    Yes Historical Provider, MD  gabapentin (NEURONTIN) 600 MG tablet Take 600 mg by mouth 3 (three) times daily.    Yes Historical Provider, MD  insulin regular human CONCENTRATED (HUMULIN R) 500 UNIT/ML SOLN injection Inject 0.1 Units into the skin 2 (two) times daily with a meal.    Yes Historical Provider, MD  levofloxacin (LEVAQUIN) 500 MG tablet Take 500 mg by mouth daily.   Yes Historical Provider, MD  omeprazole (PRILOSEC) 20 MG capsule Take 20 mg by mouth every evening.   Yes Historical Provider, MD  ondansetron (ZOFRAN) 8 MG tablet Take 8 mg by mouth every 8 (eight) hours as needed for nausea or vomiting.   Yes Historical Provider, MD  oxyCODONE (OXY IR/ROXICODONE) 5 MG immediate release tablet Take 5 mg by mouth every 6 (six) hours as needed for severe pain (pain).   Yes Historical Provider, MD  PARoxetine (PAXIL) 20 MG tablet Take 10 mg by mouth daily.    Yes Historical Provider, MD  tamsulosin (FLOMAX) 0.4 MG CAPS capsule Take 0.4 mg by mouth daily after supper.    Yes Historical Provider, MD  zolpidem (AMBIEN) 10 MG tablet Take 5 mg by mouth at bedtime.   Yes Historical Provider, MD  atenolol (TENORMIN) 25 MG tablet Take 1 tablet (25 mg total) by mouth daily. 07/30/11 12/16/14  Wayne E Gold, PA-C  lisinopril (PRINIVIL,ZESTRIL) 10 MG tablet Take 1 tablet (10 mg total) by mouth daily. 09/04/11 12/16/14  Burtis Junes, NP  methocarbamol (ROBAXIN) 500 MG tablet Take 1 tablet (500 mg total) by mouth 4 (four) times daily. Patient not taking: Reported on 03/26/2015 02/15/15   Sable Feil, PA-C  polysaccharide iron (NIFEREX) 150 MG CAPS capsule Take 1 capsule (150 mg total) by mouth daily. Patient not taking: Reported on 12/16/2014 07/30/11   John Giovanni, PA-C  traMADol (ULTRAM) 50 MG tablet Take 1 tablet (50 mg total) by mouth every 6 (six) hours as needed for moderate pain. Patient not taking: Reported on 03/26/2015 02/15/15   Sable Feil, PA-C   Fam HX:   History reviewed. No pertinent family history. Social HX:    History   Social History  . Marital Status: Married    Spouse Name: N/A  . Number of Children: N/A  . Years of Education: N/A   Occupational History  . Not on file.   Social History Main Topics  . Smoking status: Former Smoker -- 1.00 packs/day    Quit date: 07/16/2011  . Smokeless tobacco: Not on file  . Alcohol Use: No  . Drug Use: No  . Sexual Activity: Not Currently   Other Topics Concern  . Not on file   Social History Narrative     ROS:  All 11 ROS were addressed and are negative except what is stated in the HPI   Physical Exam: Blood pressure 95/52, pulse 96, temperature 97.7 F (36.5 C), temperature source Oral, resp. rate 20, height '5\' 8"'$  (1.727 m), weight 225 lb 6.4 oz (102.241 kg), SpO2 92 %.  General: Well developed, well nourished, in no acute distress Head: Eyes PERRLA, No xanthomas.   Normal cephalic and atramatic  Lungs:   Clear bilaterally to auscultation and percussion. Normal respiratory effort. No wheezes, no rales. Decreased breath sounds at bases bilaterally, overall decreased air movement bilaterally. Heart:   HRRR S1 S2 Pulses are 2+ & equal. No murmur, rubs, gallops.  No carotid bruit. No JVD.  No abdominal bruits. Frequent ectopy noted ICD. Abdomen: Bowel sounds are positive, abdomen soft and non-tender without masses. No hepatosplenomegaly. Overweight Msk:  Back normal. Normal strength and tone for age. Extremities:  No clubbing, cyanosis or edema.  DP +1 Neuro: Alert and oriented X 3, non-focal, MAE x 4 GU: Deferred Rectal: Deferred Psych:  Good affect, responds appropriately      Labs: Lab Results  Component Value Date   WBC 13.1* 03/26/2015   HGB 11.1* 03/26/2015   HCT 35.9* 03/26/2015   MCV 91.3 03/26/2015   PLT 253 03/26/2015     Recent Labs Lab 03/26/15 1413  03/28/15 0437  NA 135  < > 135  K 4.0  < > 3.4*  CL 95*  < > 91*  CO2 29   < > 36*  BUN 11  < > 15  CREATININE 0.92  < > 1.07  CALCIUM 8.0*  < > 7.1*  PROT 7.7  --   --   BILITOT 0.5  --   --   ALKPHOS 163*  --   --   ALT 16*  --   --   AST 25  --   --   GLUCOSE 331*  < > 314*  < > = values in this interval not displayed.  Recent Labs  03/26/15 1908 03/26/15 2345 03/27/15 0612  TROPONINI 0.12* 0.09* 0.10*   Lab Results  Component Value Date   CHOL 158 02/10/2012   HDL 36.50* 02/10/2012   LDLCALC 90 02/10/2012   TRIG 158.0* 02/10/2012   No results found for: DDIMER   Radiology:  Dg Chest 2 View  03/26/2015   CLINICAL DATA:  Shortness of breath and cough for 3 days, history of hypertension diabetes COPD coronary artery disease and lung cancer, former smoker  EXAM: CHEST  2 VIEW  COMPARISON:  Multiple prior studies including 02/15/2015  FINDINGS: Cardiac enlargement stable. Cardiac defibrillator in unchanged position. Moderately severe diffuse interstitial prominence. More focal opacity in the right upper lobe and peripheral aspect of the middle and lower lobe. Small bilateral pleural effusions.  IMPRESSION: Findings most consistent with cardiogenic pulmonary edema, moderate to severe. More focal confluent opacities on the right likely reflect asymmetric pulmonary edema as well, although superimposed pneumonia not excluded.   Electronically Signed   By: Skipper Cliche M.D.   On: 03/26/2015 14:48   Personally viewed.  EKG:  03/27/15 Sinus rhythm, first-degree AV block, left bundle-branch block, PVC. Previous EKG from the prior no significant change. PVCs are quite frequent. Personally viewed.   ASSESSMENT/PLAN:    69 year old male with metastatic small cell lung cancer with prognosis of 6-12 months on chemotherapy which just started early June on both etoposide and carboplatin with newly discovered ejection fraction of 25-30%, history of CABG 3, ICD placement status post VF arrest in 2008 with bioprosthetic aortic valve.  1. Acute systolic heart  failure/cardiomyopathy  - Typically, anthracycline agents or common culprits for reduction in ejection fraction or cardiomyopathy in the setting of chemotherapy. He was not on one of these agents. It would be unusual to  see cardiomyopathy occur as a result of either etoposide or carboplatin. He does have a history of coronary artery disease as noted by bypass and his cardiomyopathy may be a result of his underlying coronary artery disease perhaps. Nonetheless, he is being treated well with IV Lasix for decongestion (which is just been switched to by mouth), ACE inhibitor for afterload reduction and seen will be restarted on beta blocker, metoprolol succinate instead of atenolol. His recent heart failure exacerbation however will likely alter his course of his chemotherapeutic agents and timing of next administration. Further discussion with Dr. Sabra Heck at [facility] is ongoing with primary team.  - In part, his chemotherapy instructions are to drink liquids to help protect renal function which certainly is going against his cardiomyopathy. We discussed salt restriction and fluid restrictions ideally 48-64 ounces a day. Daily weights.  - He is eager to go home and is concerned about the financial aspect of him being here in this hospital versus the [facility]. I think that is reasonable for him to be discharged today on 40 mg of Lasix twice a day. If he gains 3-4 pounds, he understands to take an extra dose of 40 mg of Lasix.  - Further instructions on chemotherapy to be directed by Dr. Sabra Heck. His functional status at this point seems to be comparable with the way it was a week or 2 ago.  - I did discuss with him that his cardiomyopathy with concomitant lung cancer does increase his morbidity and mortality. Mildly elevated troponin, demand ischemia in the setting of heart failure, also increases his morbidity and mortality as well.   - I would use metoprolol succinate 25 mg once a day. Blood pressure fairly  soft.  2. Small cell lung cancer  - Etoposide, carboplatin. Resume at the discretion of Dr. Sabra Heck. See above discussion.  3. We would be happy to help follow-up with his cardiovascular care, Dr. Acie Fredrickson has been his cardiologist in the past. He may be seeing future care at the [facility].  4. ICD-likely being followed at the [facility]. Keep this in mind with his increased mortality of 6-12 months given his metastatic lung cancer diagnosis. May need to have discussion with his [place] electrophysiologist about turning off device therapies.  Please let us know if we can be of further assistance.  Candee Furbish, MD  03/28/2015  8:04 AM

## 2015-03-28 NOTE — Progress Notes (Signed)
Inpatient Diabetes Program Recommendations  AACE/ADA: New Consensus Statement on Inpatient Glycemic Control (2013)  Target Ranges:  Prepandial:   less than 140 mg/dL      Peak postprandial:   less than 180 mg/dL (1-2 hours)      Critically ill patients:  140 - 180 mg/dL   Results for DESMOND, SZABO (MRN [id number]) as of 03/28/2015 09:09  Ref. Range 03/27/2015 05:55 03/27/2015 11:20 03/27/2015 16:13 03/27/2015 21:34 03/28/2015 06:51  Glucose-Capillary Latest Ref Range: 65-99 mg/dL 247 (H) 298 (H) 233 (H) 250 (H) 276 (H)    Diabetes history: DM2 Outpatient Diabetes medications: Humulin R U-500 25 units BID Current orders for Inpatient glycemic control: Humulin R U-500 25 units BID, Novolog 0-15 units TID with meals  Inpatient Diabetes Program Recommendations Insulin - Basal: Please consider increasing U500 to 26 units BID. Correction (SSI): Note that Novolog correction was refused at 8:00 am and 5 pm on 03/27/15 and today at 6:54 am Novolog correction was charted as Not Given with reason "Pt taking U-500".  Thanks, Barnie Alderman, RN, MSN, CCRN, CDE Diabetes Coordinator Inpatient Diabetes Program [phone] (Team Pager from [place] to [place]) [phone] ([facility] office) [phone] [facility] office) [phone] [facility] office)

## 2015-03-28 NOTE — Discharge Instructions (Signed)
You were hospitalized for shortness of breath, and found to have an exacerbation of congestive heart failure. Please continue to take your home medications as they were prescribed before your hospitalization. Also, be sure to take 40 mg Lasix by mouth two times a day. Measure yourself daily, and if you find that you have gained 3-5 pounds, please take an extra 40 mg of Lasix. Also, please restrict the amount of salt in your diet, and make sure to drink 48-64 ounces of water every day to help protect your kidneys.   It is very important to follow-up with your primary doctor and your oncologist, so please make those appointments within one week.

## 2015-04-24 ENCOUNTER — Encounter (HOSPITAL_COMMUNITY): Payer: Self-pay

## 2015-04-24 ENCOUNTER — Inpatient Hospital Stay (HOSPITAL_COMMUNITY)
Admission: EM | Admit: 2015-04-24 | Discharge: 2015-04-28 | DRG: 640 | Disposition: A | Discharge status: Hospice | Payer: Non-veteran care | Attending: Internal Medicine | Admitting: Internal Medicine

## 2015-04-24 ENCOUNTER — Emergency Department (HOSPITAL_COMMUNITY): Payer: Non-veteran care

## 2015-04-24 DIAGNOSIS — I4901 Ventricular fibrillation: Secondary | ICD-10-CM | POA: Diagnosis present

## 2015-04-24 DIAGNOSIS — Z952 Presence of prosthetic heart valve: Secondary | ICD-10-CM

## 2015-04-24 DIAGNOSIS — I350 Nonrheumatic aortic (valve) stenosis: Secondary | ICD-10-CM | POA: Diagnosis present

## 2015-04-24 DIAGNOSIS — F4310 Post-traumatic stress disorder, unspecified: Secondary | ICD-10-CM | POA: Diagnosis present

## 2015-04-24 DIAGNOSIS — Z794 Long term (current) use of insulin: Secondary | ICD-10-CM

## 2015-04-24 DIAGNOSIS — Z888 Allergy status to other drugs, medicaments and biological substances status: Secondary | ICD-10-CM

## 2015-04-24 DIAGNOSIS — Z79891 Long term (current) use of opiate analgesic: Secondary | ICD-10-CM

## 2015-04-24 DIAGNOSIS — M542 Cervicalgia: Secondary | ICD-10-CM

## 2015-04-24 DIAGNOSIS — Z515 Encounter for palliative care: Secondary | ICD-10-CM | POA: Insufficient documentation

## 2015-04-24 DIAGNOSIS — E1165 Type 2 diabetes mellitus with hyperglycemia: Secondary | ICD-10-CM | POA: Diagnosis present

## 2015-04-24 DIAGNOSIS — R0902 Hypoxemia: Secondary | ICD-10-CM

## 2015-04-24 DIAGNOSIS — Z9861 Coronary angioplasty status: Secondary | ICD-10-CM

## 2015-04-24 DIAGNOSIS — Z79899 Other long term (current) drug therapy: Secondary | ICD-10-CM

## 2015-04-24 DIAGNOSIS — Z87891 Personal history of nicotine dependence: Secondary | ICD-10-CM

## 2015-04-24 DIAGNOSIS — E1159 Type 2 diabetes mellitus with other circulatory complications: Secondary | ICD-10-CM

## 2015-04-24 DIAGNOSIS — I5021 Acute systolic (congestive) heart failure: Secondary | ICD-10-CM | POA: Diagnosis not present

## 2015-04-24 DIAGNOSIS — C3490 Malignant neoplasm of unspecified part of unspecified bronchus or lung: Secondary | ICD-10-CM | POA: Diagnosis present

## 2015-04-24 DIAGNOSIS — T451X5A Adverse effect of antineoplastic and immunosuppressive drugs, initial encounter: Secondary | ICD-10-CM | POA: Diagnosis present

## 2015-04-24 DIAGNOSIS — Z951 Presence of aortocoronary bypass graft: Secondary | ICD-10-CM

## 2015-04-24 DIAGNOSIS — Z85828 Personal history of other malignant neoplasm of skin: Secondary | ICD-10-CM

## 2015-04-24 DIAGNOSIS — R51 Headache: Secondary | ICD-10-CM | POA: Diagnosis present

## 2015-04-24 DIAGNOSIS — R9431 Abnormal electrocardiogram [ECG] [EKG]: Secondary | ICD-10-CM | POA: Diagnosis present

## 2015-04-24 DIAGNOSIS — I2510 Atherosclerotic heart disease of native coronary artery without angina pectoris: Secondary | ICD-10-CM | POA: Diagnosis present

## 2015-04-24 DIAGNOSIS — Z95810 Presence of automatic (implantable) cardiac defibrillator: Secondary | ICD-10-CM

## 2015-04-24 DIAGNOSIS — Z66 Do not resuscitate: Secondary | ICD-10-CM | POA: Diagnosis present

## 2015-04-24 DIAGNOSIS — R519 Headache, unspecified: Secondary | ICD-10-CM

## 2015-04-24 DIAGNOSIS — C787 Secondary malignant neoplasm of liver and intrahepatic bile duct: Secondary | ICD-10-CM | POA: Diagnosis present

## 2015-04-24 DIAGNOSIS — J189 Pneumonia, unspecified organism: Secondary | ICD-10-CM

## 2015-04-24 DIAGNOSIS — C7951 Secondary malignant neoplasm of bone: Secondary | ICD-10-CM | POA: Diagnosis present

## 2015-04-24 DIAGNOSIS — J449 Chronic obstructive pulmonary disease, unspecified: Secondary | ICD-10-CM | POA: Diagnosis present

## 2015-04-24 DIAGNOSIS — Z7982 Long term (current) use of aspirin: Secondary | ICD-10-CM

## 2015-04-24 DIAGNOSIS — I10 Essential (primary) hypertension: Secondary | ICD-10-CM | POA: Diagnosis present

## 2015-04-24 DIAGNOSIS — D649 Anemia, unspecified: Secondary | ICD-10-CM | POA: Diagnosis present

## 2015-04-24 DIAGNOSIS — I252 Old myocardial infarction: Secondary | ICD-10-CM

## 2015-04-24 DIAGNOSIS — I4581 Long QT syndrome: Secondary | ICD-10-CM | POA: Diagnosis present

## 2015-04-24 LAB — CBC WITH DIFFERENTIAL/PLATELET
BASOS ABS: 0 10*3/uL (ref 0.0–0.1)
BASOS PCT: 0 % (ref 0–1)
EOS ABS: 0.2 10*3/uL (ref 0.0–0.7)
Eosinophils Relative: 1 % (ref 0–5)
HCT: 29.8 % — ABNORMAL LOW (ref 39.0–52.0)
Hemoglobin: 9.7 g/dL — ABNORMAL LOW (ref 13.0–17.0)
Lymphocytes Relative: 11 % — ABNORMAL LOW (ref 12–46)
Lymphs Abs: 1.5 10*3/uL (ref 0.7–4.0)
MCH: 28.7 pg (ref 26.0–34.0)
MCHC: 32.6 g/dL (ref 30.0–36.0)
MCV: 88.2 fL (ref 78.0–100.0)
MONOS PCT: 7 % (ref 3–12)
Monocytes Absolute: 0.9 10*3/uL (ref 0.1–1.0)
NEUTROS ABS: 10.9 10*3/uL — AB (ref 1.7–7.7)
Neutrophils Relative %: 81 % — ABNORMAL HIGH (ref 43–77)
Platelets: 313 10*3/uL (ref 150–400)
RBC: 3.38 MIL/uL — AB (ref 4.22–5.81)
RDW: 18.9 % — ABNORMAL HIGH (ref 11.5–15.5)
WBC: 13.4 10*3/uL — AB (ref 4.0–10.5)

## 2015-04-24 MED ORDER — HYDROMORPHONE HCL 1 MG/ML IJ SOLN
0.5000 mg | Freq: Once | INTRAMUSCULAR | Status: AC
  Administered 2015-04-24: 0.5 mg via INTRAVENOUS
  Filled 2015-04-24: qty 1

## 2015-04-24 MED ORDER — SODIUM CHLORIDE 0.9 % IV BOLUS (SEPSIS)
1000.0000 mL | Freq: Once | INTRAVENOUS | Status: AC
  Administered 2015-04-24: 1000 mL via INTRAVENOUS

## 2015-04-24 MED ORDER — METOCLOPRAMIDE HCL 5 MG/ML IJ SOLN
10.0000 mg | Freq: Once | INTRAMUSCULAR | Status: AC
  Administered 2015-04-24: 10 mg via INTRAVENOUS
  Filled 2015-04-24: qty 2

## 2015-04-24 NOTE — ED Notes (Signed)
Pt to CT at this time.

## 2015-04-24 NOTE — ED Provider Notes (Addendum)
CSN: [id number]     Arrival date & time 04/24/15  2043 History   First MD Initiated Contact with Patient 04/24/15 2134     Chief Complaint  Patient presents with  . Headache     (Consider location/radiation/quality/duration/timing/severity/associated sxs/prior Treatment) HPI 69 year old male who presents with headache. No prior history of headaches. History of CAD status post CABG, aortic stenosis status post AVR, ventricular fibrillation status post AICD, and lung cancer with metastasis to the bone, liver, and lymph nodes on chemotherapy. Reports noticing mild bifrontal headache this morning after waking from sleep. Pain has gradually worsened throughout the day, only mildly improved oxycodone. Denies any associating nausea, vomiting, focal weakness or numbness, vertigo or gait instability, vision changes or speech changes. He has not had any recent infections and denies fever, URI symptoms, ear pain or hearing loss, or neck pain.  Past Medical History  Diagnosis Date  . Myocardial infarction   . Ventricular tachycardia   . Diabetes mellitus   . COPD (chronic obstructive pulmonary disease)   . CAD (coronary artery disease) Dec 2012    s/p CABG x 3 with AVR; has normal EF  . Aortic stenosis Dec 2012    s/p AVR  . S/P ICD (internal cardiac defibrillator) procedure   . PTSD (post-traumatic stress disorder)   . HTN (hypertension)   . Tobacco abuse    Past Surgical History  Procedure Laterality Date  . Icd implant    . Nose surgery      skin cancer  . Cardiac catheterization    . Coronary angioplasty    . Tee without cardioversion  07/22/2011    Procedure: TRANSESOPHAGEAL ECHOCARDIOGRAM (TEE);  Surgeon: Lelon Perla, MD;  Location: [facility] ENDOSCOPY;  Service: Cardiovascular;  Laterality: N/A;  . Coronary artery bypass graft  07/23/2011    Procedure: CORONARY ARTERY BYPASS GRAFTING (CABG);  Surgeon: Rexene Alberts, MD;  Location: [place];  Service: Open Heart Surgery;  Laterality: N/A;   CABG x 3 using bilateral greater saphenous veins harvested endoscopically  . Aortic valve replacement  07/23/2011    Procedure: AORTIC VALVE REPLACEMENT (AVR);  Surgeon: Rexene Alberts, MD;  Location: [place];  Service: Open Heart Surgery;  Laterality: N/A;  . Left heart catheterization with coronary angiogram N/A 07/18/2011    Procedure: LEFT HEART CATHETERIZATION WITH CORONARY ANGIOGRAM;  Surgeon: Sherren Mocha, MD;  Location: [facility] CATH LAB;  Service: Cardiovascular;  Laterality: N/A;  . Fractional flow reserve wire N/A 07/18/2011    Procedure: FRACTIONAL FLOW RESERVE WIRE;  Surgeon: Sherren Mocha, MD;  Location: [facility] CATH LAB;  Service: Cardiovascular;  Laterality: N/A;   History reviewed. No pertinent family history. Social History  Substance Use Topics  . Smoking status: Former Smoker -- 1.00 packs/day    Quit date: 07/16/2011  . Smokeless tobacco: None  . Alcohol Use: No    Review of Systems 10/14 systems reviewed and are negative other than those stated in the HPI   Allergies  Procaine hcl and Rosuvastatin  Home Medications   Prior to Admission medications   Medication Sig Start Date End Date Taking? Authorizing Provider  albuterol (PROVENTIL HFA;VENTOLIN HFA) 108 (90 BASE) MCG/ACT inhaler Inhale 1-2 puffs into the lungs every 6 (six) hours as needed for shortness of breath.    Yes Historical Provider, MD  aspirin 81 MG tablet Take 81 mg by mouth daily.    Yes Historical Provider, MD  atorvastatin (LIPITOR) 80 MG tablet Take 80 mg by mouth every evening.  Yes Historical Provider, MD  furosemide (LASIX) 40 MG tablet Take 1 tablet (40 mg total) by mouth 2 (two) times daily. 03/28/15  Yes Verner Mould, MD  gabapentin (NEURONTIN) 600 MG tablet Take 600 mg by mouth 3 (three) times daily.    Yes Historical Provider, MD  insulin regular human CONCENTRATED (HUMULIN R) 500 UNIT/ML SOLN injection Inject 0.1 Units into the skin 2 (two) times daily with a meal.    Yes Historical  Provider, MD  lisinopril (PRINIVIL,ZESTRIL) 10 MG tablet Take 1 tablet (10 mg total) by mouth daily. 03/28/15 07/09/18 Yes Abigail Delman Cheadle, MD  metoprolol succinate (TOPROL XL) 25 MG 24 hr tablet Take 1 tablet (25 mg total) by mouth daily. 03/28/15  Yes Verner Mould, MD  omeprazole (PRILOSEC) 20 MG capsule Take 20 mg by mouth every evening.   Yes Historical Provider, MD  ondansetron (ZOFRAN) 8 MG tablet Take 8 mg by mouth every 8 (eight) hours as needed for nausea or vomiting.   Yes Historical Provider, MD  oxyCODONE (OXY IR/ROXICODONE) 5 MG immediate release tablet Take 5 mg by mouth every 6 (six) hours as needed for severe pain (pain).   Yes Historical Provider, MD  PARoxetine (PAXIL) 20 MG tablet Take 10 mg by mouth daily.    Yes Historical Provider, MD  tamsulosin (FLOMAX) 0.4 MG CAPS capsule Take 0.4 mg by mouth daily after supper.    Yes Historical Provider, MD  zolpidem (AMBIEN) 10 MG tablet Take 5 mg by mouth at bedtime.   Yes Historical Provider, MD   BP 116/94 mmHg  Pulse 81  Temp(Src) 97.9 F (36.6 C) (Oral)  Resp 23  Ht 6' (1.829 m)  Wt 224 lb (101.606 kg)  BMI 30.37 kg/m2  SpO2 93% Physical Exam Physical Exam  Nursing note and vitals reviewed. Constitutional: Well developed, well nourished, non-toxic, and in no acute distress Head: Normocephalic and atraumatic.  Mouth/Throat: Oropharynx is clear and moist.  Neck: Normal range of motion. Neck supple.  Cardiovascular: Normal rate and regular rhythm.   Pulmonary/Chest: Effort normal and breath sounds normal.  Abdominal: Soft. There is no tenderness. There is no rebound and no guarding.  Musculoskeletal: Normal range of motion.  Skin: Skin is warm and dry.  Psychiatric: Cooperative  Neurological:  Alert, oriented to person, place, time, and situation. Memory grossly in tact. Fluent speech. No dysarthria or aphasia.  Cranial nerves: VF are full. Pupils are symmetric, and reactive to light. EOMI without  nystagmus. No gaze deviation. Facial muscles symmetric with activation. Sensation to light touch over face in tact bilaterally. Hearing grossly in tact. Palate elevates symmetrically. Head turn and shoulder shrug are intact. Tongue midline.  Muscle bulk and tone normal. No pronator drift. Moves all extremities symmetrically. Sensation to light touch is in tact throughout in bilateral upper and lower extremities. Coordination reveals no dysmetria with finger to nose. ED Course  Procedures (including critical care time) Labs Review Labs Reviewed  CBC WITH DIFFERENTIAL/PLATELET - Abnormal; Notable for the following:    WBC 13.4 (*)    RBC 3.38 (*)    Hemoglobin 9.7 (*)    HCT 29.8 (*)    RDW 18.9 (*)    Neutrophils Relative % 81 (*)    Neutro Abs 10.9 (*)    Lymphocytes Relative 11 (*)    All other components within normal limits  BASIC METABOLIC PANEL - Abnormal; Notable for the following:    Sodium 133 (*)    Chloride 91 (*)  Glucose, Bld 355 (*)    Calcium 6.2 (*)    All other components within normal limits  MAGNESIUM - Abnormal; Notable for the following:    Magnesium 0.8 (*)    All other components within normal limits    Imaging Review Ct Head Wo Contrast  04/25/2015   CLINICAL DATA:  New onset headaches. History of Coronary artery disease, aortic stenosis, ventricular fibrillation, lung cancer with bone metastasis on chemotherapy.  EXAM: CT HEAD WITHOUT CONTRAST  TECHNIQUE: Contiguous axial images were obtained from the base of the skull through the vertex without intravenous contrast.  COMPARISON:  07/28/2011  FINDINGS: Mild diffuse cerebral atrophy. Patchy low-attenuation changes in the deep white matter consistent with small vessel ischemia. No ventricular dilatation. No mass effect or midline shift. No abnormal extra-axial fluid collections. Gray-white matter junctions are distinct. Basal cisterns are not effaced. No evidence of acute intracranial hemorrhage. No depressed  skull fractures. Mild mucosal thickening in the paranasal sinuses. No acute air-fluid levels. Mastoid air cells are not opacified. Vascular calcifications.  IMPRESSION: No acute intracranial abnormalities. Chronic atrophy and small vessel ischemic changes.   Electronically Signed   By: Lucienne Capers M.D.   On: 04/25/2015 00:10   I have personally reviewed and evaluated these images and lab results as part of my medical decision-making.   EKG Interpretation 04/25/2015 00:34:54 Normal sinus rhythm, first-degree AV block, QT prolongation with QTC of 620, left bundle branch block, left axis deviation    MDM   Final diagnoses:  Hypomagnesemia  Hypocalcemia  Acute nonintractable headache, unspecified headache type   CRITICAL CARE Performed by: Forde Dandy   Total critical care time: 35 minutes  Critical care time was exclusive of separately billable procedures and treating other patients.  Critical care was necessary to treat or prevent imminent or life-threatening deterioration.  Critical care was time spent personally by me on the following activities: management of severe electrolyte derangements, development of treatment plan with patient and/or surrogate as well as nursing, discussions with consultants, evaluation of patient's response to treatment, examination of patient, obtaining history from patient or surrogate, ordering and performing treatments and interventions, ordering and review of laboratory studies, ordering and review of radiographic studies, pulse oximetry and re-evaluation of patient's condition.     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 He is nontoxic in no acute distress on presentation. Vital signs are not concerning. He is neurologically intact on evaluation.  Remainder of exam is unremarkable and nonfocal. CT head was performed given age with new onset headaches. This shows no acute intracranial processes. Pain was not of sudden onset maximal intensity, not concerning for subarachnoid hemorrhage. No infectious symptoms or meningitis is to suspect meningitis or other infectious etiologies. No temporal pain, vision symptoms, or other deficits concerning for temporal arteritis. Unclear of etiology of headache at this time, but I do not feel he requires additional emergent imaging at this time.  Basic blood work shows incidentally shows worsening hypocalcemia to 6.2. He does have prolongation of his QTC to 620 with this. He also has associated hypomagnesemia of 0.8. He is given a gram of calcium gluconate with 2 g of magnesium sulfate. Given significant QTC prolongation he will be admitted pending electrolyte repletion.    Forde Dandy, MD 04/25/15 Elsie Amis  Hinton Dyer  Roderic Ovens, MD 04/25/15 [phone]

## 2015-04-24 NOTE — ED Notes (Signed)
Per [facility], pt from home for a HA since 0900. Pt denies any N/V, sensitivity to light or sounds. Pt took 2 percocets earlier without relief. The headache started at the back of his head and now covers the entire head more on the right side. 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 Also reports being on steroids for the CA and increases his sugar. CBG by EMS 394. VSS.

## 2015-04-25 ENCOUNTER — Inpatient Hospital Stay (HOSPITAL_COMMUNITY): Payer: Non-veteran care

## 2015-04-25 ENCOUNTER — Encounter (HOSPITAL_COMMUNITY): Payer: Self-pay

## 2015-04-25 DIAGNOSIS — J449 Chronic obstructive pulmonary disease, unspecified: Secondary | ICD-10-CM | POA: Diagnosis present

## 2015-04-25 DIAGNOSIS — T451X5A Adverse effect of antineoplastic and immunosuppressive drugs, initial encounter: Secondary | ICD-10-CM | POA: Diagnosis present

## 2015-04-25 DIAGNOSIS — Z515 Encounter for palliative care: Secondary | ICD-10-CM | POA: Diagnosis not present

## 2015-04-25 DIAGNOSIS — F4310 Post-traumatic stress disorder, unspecified: Secondary | ICD-10-CM | POA: Diagnosis present

## 2015-04-25 DIAGNOSIS — Z79891 Long term (current) use of opiate analgesic: Secondary | ICD-10-CM | POA: Diagnosis not present

## 2015-04-25 DIAGNOSIS — C3400 Malignant neoplasm of unspecified main bronchus: Secondary | ICD-10-CM | POA: Diagnosis not present

## 2015-04-25 DIAGNOSIS — C7951 Secondary malignant neoplasm of bone: Secondary | ICD-10-CM | POA: Diagnosis present

## 2015-04-25 DIAGNOSIS — C787 Secondary malignant neoplasm of liver and intrahepatic bile duct: Secondary | ICD-10-CM | POA: Diagnosis present

## 2015-04-25 DIAGNOSIS — Z85828 Personal history of other malignant neoplasm of skin: Secondary | ICD-10-CM | POA: Diagnosis not present

## 2015-04-25 DIAGNOSIS — I2510 Atherosclerotic heart disease of native coronary artery without angina pectoris: Secondary | ICD-10-CM | POA: Diagnosis present

## 2015-04-25 DIAGNOSIS — M542 Cervicalgia: Secondary | ICD-10-CM | POA: Diagnosis present

## 2015-04-25 DIAGNOSIS — I5021 Acute systolic (congestive) heart failure: Secondary | ICD-10-CM | POA: Diagnosis not present

## 2015-04-25 DIAGNOSIS — I252 Old myocardial infarction: Secondary | ICD-10-CM | POA: Diagnosis not present

## 2015-04-25 DIAGNOSIS — C3490 Malignant neoplasm of unspecified part of unspecified bronchus or lung: Secondary | ICD-10-CM | POA: Diagnosis present

## 2015-04-25 DIAGNOSIS — R51 Headache: Secondary | ICD-10-CM | POA: Diagnosis present

## 2015-04-25 DIAGNOSIS — Z951 Presence of aortocoronary bypass graft: Secondary | ICD-10-CM | POA: Diagnosis not present

## 2015-04-25 DIAGNOSIS — Z95810 Presence of automatic (implantable) cardiac defibrillator: Secondary | ICD-10-CM | POA: Diagnosis not present

## 2015-04-25 DIAGNOSIS — Z87891 Personal history of nicotine dependence: Secondary | ICD-10-CM | POA: Diagnosis not present

## 2015-04-25 DIAGNOSIS — Z66 Do not resuscitate: Secondary | ICD-10-CM | POA: Diagnosis present

## 2015-04-25 DIAGNOSIS — D649 Anemia, unspecified: Secondary | ICD-10-CM | POA: Diagnosis present

## 2015-04-25 DIAGNOSIS — I10 Essential (primary) hypertension: Secondary | ICD-10-CM | POA: Diagnosis present

## 2015-04-25 DIAGNOSIS — Z9861 Coronary angioplasty status: Secondary | ICD-10-CM | POA: Diagnosis not present

## 2015-04-25 DIAGNOSIS — E1159 Type 2 diabetes mellitus with other circulatory complications: Secondary | ICD-10-CM | POA: Diagnosis not present

## 2015-04-25 DIAGNOSIS — E1165 Type 2 diabetes mellitus with hyperglycemia: Secondary | ICD-10-CM | POA: Diagnosis present

## 2015-04-25 DIAGNOSIS — I350 Nonrheumatic aortic (valve) stenosis: Secondary | ICD-10-CM | POA: Diagnosis present

## 2015-04-25 DIAGNOSIS — Z794 Long term (current) use of insulin: Secondary | ICD-10-CM | POA: Diagnosis not present

## 2015-04-25 DIAGNOSIS — I4581 Long QT syndrome: Secondary | ICD-10-CM | POA: Diagnosis present

## 2015-04-25 DIAGNOSIS — Z79899 Other long term (current) drug therapy: Secondary | ICD-10-CM | POA: Diagnosis not present

## 2015-04-25 DIAGNOSIS — Z888 Allergy status to other drugs, medicaments and biological substances status: Secondary | ICD-10-CM | POA: Diagnosis not present

## 2015-04-25 DIAGNOSIS — I4901 Ventricular fibrillation: Secondary | ICD-10-CM | POA: Diagnosis present

## 2015-04-25 DIAGNOSIS — Z952 Presence of prosthetic heart valve: Secondary | ICD-10-CM | POA: Diagnosis not present

## 2015-04-25 DIAGNOSIS — R9431 Abnormal electrocardiogram [ECG] [EKG]: Secondary | ICD-10-CM | POA: Diagnosis present

## 2015-04-25 DIAGNOSIS — Z7982 Long term (current) use of aspirin: Secondary | ICD-10-CM | POA: Diagnosis not present

## 2015-04-25 LAB — BASIC METABOLIC PANEL
ANION GAP: 11 (ref 5–15)
Anion gap: 11 (ref 5–15)
Anion gap: 11 (ref 5–15)
BUN: 13 mg/dL (ref 6–20)
BUN: 11 mg/dL (ref 6–20)
BUN: 11 mg/dL (ref 6–20)
CALCIUM: 6.3 mg/dL — AB (ref 8.9–10.3)
CALCIUM: 6.7 mg/dL — AB (ref 8.9–10.3)
CO2: 31 mmol/L (ref 22–32)
CO2: 30 mmol/L (ref 22–32)
CO2: 29 mmol/L (ref 22–32)
CREATININE: 1.17 mg/dL (ref 0.61–1.24)
Calcium: 6.2 mg/dL — CL (ref 8.9–10.3)
Chloride: 91 mmol/L — ABNORMAL LOW (ref 101–111)
Chloride: 94 mmol/L — ABNORMAL LOW (ref 101–111)
Chloride: 95 mmol/L — ABNORMAL LOW (ref 101–111)
Creatinine, Ser: 1.02 mg/dL (ref 0.61–1.24)
Creatinine, Ser: 0.93 mg/dL (ref 0.61–1.24)
GFR calc Af Amer: >60 mL/min (ref >60)
GFR calc Af Amer: >60 mL/min (ref >60)
GLUCOSE: 314 mg/dL — AB (ref 65–99)
Glucose, Bld: 355 mg/dL — ABNORMAL HIGH (ref 65–99)
Glucose, Bld: 151 mg/dL — ABNORMAL HIGH (ref 65–99)
POTASSIUM: 4.1 mmol/L (ref 3.5–5.1)
POTASSIUM: 3.8 mmol/L (ref 3.5–5.1)
Potassium: 3.9 mmol/L (ref 3.5–5.1)
SODIUM: 133 mmol/L — AB (ref 135–145)
SODIUM: 135 mmol/L (ref 135–145)
Sodium: 135 mmol/L (ref 135–145)

## 2015-04-25 LAB — MAGNESIUM
MAGNESIUM: 0.8 mg/dL — AB (ref 1.7–2.4)
MAGNESIUM: 1.4 mg/dL — AB (ref 1.7–2.4)
MAGNESIUM: 1.9 mg/dL (ref 1.7–2.4)
Magnesium: 1.3 mg/dL — ABNORMAL LOW (ref 1.7–2.4)

## 2015-04-25 LAB — GLUCOSE, CAPILLARY
GLUCOSE-CAPILLARY: 142 mg/dL — AB (ref 65–99)
Glucose-Capillary: 282 mg/dL — ABNORMAL HIGH (ref 65–99)
Glucose-Capillary: 93 mg/dL (ref 65–99)

## 2015-04-25 LAB — CBC
HEMATOCRIT: 29.2 % — AB (ref 39.0–52.0)
Hemoglobin: 9.1 g/dL — ABNORMAL LOW (ref 13.0–17.0)
MCH: 27.9 pg (ref 26.0–34.0)
MCHC: 31.2 g/dL (ref 30.0–36.0)
MCV: 89.6 fL (ref 78.0–100.0)
PLATELETS: 297 10*3/uL (ref 150–400)
RBC: 3.26 MIL/uL — ABNORMAL LOW (ref 4.22–5.81)
RDW: 19.1 % — AB (ref 11.5–15.5)
WBC: 12.4 10*3/uL — ABNORMAL HIGH (ref 4.0–10.5)

## 2015-04-25 LAB — MRSA PCR SCREENING: MRSA by PCR: NEGATIVE

## 2015-04-25 LAB — CALCIUM: CALCIUM: 6.3 mg/dL — AB (ref 8.9–10.3)

## 2015-04-25 MED ORDER — ONDANSETRON HCL 4 MG PO TABS: 8.0000 mg | ORAL_TABLET | Freq: Three times a day (TID) | ORAL | Status: DC | PRN

## 2015-04-25 MED ORDER — ALBUTEROL SULFATE (2.5 MG/3ML) 0.083% IN NEBU: 2.5000 mg | INHALATION_SOLUTION | Freq: Four times a day (QID) | RESPIRATORY_TRACT | Status: DC | PRN

## 2015-04-25 MED ORDER — SODIUM CHLORIDE 0.9 % IJ SOLN
3.0000 mL | Freq: Two times a day (BID) | INTRAMUSCULAR | Status: DC
  Administered 2015-04-25 – 2015-04-28 (×8): 3 mL via INTRAVENOUS

## 2015-04-25 MED ORDER — ASPIRIN EC 81 MG PO TBEC
81.0000 mg | DELAYED_RELEASE_TABLET | Freq: Every day | ORAL | Status: DC
  Administered 2015-04-25 – 2015-04-28 (×4): 81 mg via ORAL
  Filled 2015-04-25 (×4): qty 1

## 2015-04-25 MED ORDER — METOPROLOL SUCCINATE ER 25 MG PO TB24
25.0000 mg | ORAL_TABLET | Freq: Every day | ORAL | Status: DC
  Administered 2015-04-25 – 2015-04-28 (×4): 25 mg via ORAL
  Filled 2015-04-25 (×4): qty 1

## 2015-04-25 MED ORDER — INSULIN ASPART 100 UNIT/ML ~~LOC~~ SOLN
6.0000 [IU] | Freq: Three times a day (TID) | SUBCUTANEOUS | Status: DC
  Administered 2015-04-25 – 2015-04-27 (×6): 6 [IU] via SUBCUTANEOUS

## 2015-04-25 MED ORDER — INSULIN GLARGINE 100 UNIT/ML ~~LOC~~ SOLN
40.0000 [IU] | Freq: Every day | SUBCUTANEOUS | Status: DC
  Administered 2015-04-25 – 2015-04-26 (×2): 40 [IU] via SUBCUTANEOUS
  Filled 2015-04-25 (×3): qty 0.4

## 2015-04-25 MED ORDER — FUROSEMIDE 10 MG/ML IJ SOLN
40.0000 mg | Freq: Once | INTRAMUSCULAR | Status: AC
  Administered 2015-04-25: 40 mg via INTRAVENOUS
  Filled 2015-04-25: qty 4

## 2015-04-25 MED ORDER — MAGNESIUM SULFATE 2 GM/50ML IV SOLN
2.0000 g | Freq: Once | INTRAVENOUS | Status: AC
  Administered 2015-04-25: 2 g via INTRAVENOUS
  Filled 2015-04-25: qty 50

## 2015-04-25 MED ORDER — ENSURE ENLIVE PO LIQD
237.0000 mL | Freq: Two times a day (BID) | ORAL | Status: DC
  Administered 2015-04-25: 237 mL via ORAL

## 2015-04-25 MED ORDER — MORPHINE SULFATE (PF) 2 MG/ML IV SOLN
2.0000 mg | INTRAVENOUS | Status: DC | PRN
  Administered 2015-04-25: 2 mg via INTRAVENOUS
  Filled 2015-04-25: qty 1

## 2015-04-25 MED ORDER — GABAPENTIN 600 MG PO TABS
600.0000 mg | ORAL_TABLET | Freq: Three times a day (TID) | ORAL | Status: DC
  Administered 2015-04-25 – 2015-04-28 (×10): 600 mg via ORAL
  Filled 2015-04-25 (×12): qty 1

## 2015-04-25 MED ORDER — HYDROMORPHONE HCL 1 MG/ML IJ SOLN
1.0000 mg | INTRAMUSCULAR | Status: DC | PRN
  Administered 2015-04-25 (×2): 1 mg via INTRAVENOUS
  Filled 2015-04-25 (×3): qty 1

## 2015-04-25 MED ORDER — OXYCODONE HCL 5 MG PO TABS
5.0000 mg | ORAL_TABLET | Freq: Four times a day (QID) | ORAL | Status: DC | PRN
  Administered 2015-04-25 – 2015-04-28 (×7): 5 mg via ORAL
  Filled 2015-04-25 (×7): qty 1

## 2015-04-25 MED ORDER — PANTOPRAZOLE SODIUM 40 MG PO TBEC
40.0000 mg | DELAYED_RELEASE_TABLET | Freq: Every day | ORAL | Status: DC
  Administered 2015-04-25 – 2015-04-28 (×4): 40 mg via ORAL
  Filled 2015-04-25 (×4): qty 1

## 2015-04-25 MED ORDER — ENOXAPARIN SODIUM 40 MG/0.4ML ~~LOC~~ SOLN
40.0000 mg | Freq: Every day | SUBCUTANEOUS | Status: DC
Start: 2015-04-25 — End: 2015-04-28
  Administered 2015-04-25 – 2015-04-28 (×4): 40 mg via SUBCUTANEOUS
  Filled 2015-04-25 (×3): qty 0.4

## 2015-04-25 MED ORDER — SODIUM CHLORIDE 0.9 % IV SOLN
1.0000 g | Freq: Once | INTRAVENOUS | Status: AC
  Administered 2015-04-25: 1 g via INTRAVENOUS
  Filled 2015-04-25: qty 10

## 2015-04-25 MED ORDER — INSULIN ASPART 100 UNIT/ML ~~LOC~~ SOLN
0.0000 [IU] | Freq: Three times a day (TID) | SUBCUTANEOUS | Status: DC
  Administered 2015-04-25: 11 [IU] via SUBCUTANEOUS
  Administered 2015-04-25: 3 [IU] via SUBCUTANEOUS
  Administered 2015-04-25: 7 [IU] via SUBCUTANEOUS
  Administered 2015-04-26: 11 [IU] via SUBCUTANEOUS
  Administered 2015-04-26: 3 [IU] via SUBCUTANEOUS
  Administered 2015-04-26: 7 [IU] via SUBCUTANEOUS
  Administered 2015-04-27: 20 [IU] via SUBCUTANEOUS
  Administered 2015-04-27: 3 [IU] via SUBCUTANEOUS
  Administered 2015-04-27: 11 [IU] via SUBCUTANEOUS
  Administered 2015-04-28: 4 [IU] via SUBCUTANEOUS
  Administered 2015-04-28: 15 [IU] via SUBCUTANEOUS

## 2015-04-25 MED ORDER — PAROXETINE HCL 20 MG PO TABS
10.0000 mg | ORAL_TABLET | Freq: Every day | ORAL | Status: DC
Start: 2015-04-25 — End: 2015-04-28
  Administered 2015-04-25 – 2015-04-28 (×4): 10 mg via ORAL
  Filled 2015-04-25 (×4): qty 1

## 2015-04-25 MED ORDER — ALBUTEROL SULFATE HFA 108 (90 BASE) MCG/ACT IN AERS: 1.0000 | INHALATION_SPRAY | Freq: Four times a day (QID) | RESPIRATORY_TRACT | Status: DC | PRN

## 2015-04-25 MED ORDER — HYDROMORPHONE HCL 1 MG/ML IJ SOLN
1.0000 mg | Freq: Once | INTRAMUSCULAR | Status: AC
  Administered 2015-04-25: 1 mg via INTRAVENOUS
  Filled 2015-04-25: qty 1

## 2015-04-25 MED ORDER — LISINOPRIL 10 MG PO TABS
10.0000 mg | ORAL_TABLET | Freq: Every day | ORAL | Status: DC
Start: 2015-04-25 — End: 2015-04-28
  Administered 2015-04-25 – 2015-04-28 (×4): 10 mg via ORAL
  Filled 2015-04-25 (×4): qty 1

## 2015-04-25 MED ORDER — ATORVASTATIN CALCIUM 80 MG PO TABS
80.0000 mg | ORAL_TABLET | Freq: Every evening | ORAL | Status: DC
  Administered 2015-04-25 – 2015-04-27 (×3): 80 mg via ORAL
  Filled 2015-04-25 (×3): qty 1

## 2015-04-25 MED ORDER — TAMSULOSIN HCL 0.4 MG PO CAPS
0.4000 mg | ORAL_CAPSULE | Freq: Every day | ORAL | Status: DC
  Administered 2015-04-25 – 2015-04-27 (×3): 0.4 mg via ORAL
  Filled 2015-04-25 (×4): qty 1

## 2015-04-25 NOTE — Progress Notes (Signed)
Report called to [place]. Family made aware of move. Pt and belongings will be transferred shortly.

## 2015-04-25 NOTE — H&P (Addendum)
[facility] History and Physical  Sean [name] ZYS:[id number] DOB: 10-23-1945 DOA: 04/24/2015  Referring physician: EDP PCP: Pcp Not In System   Chief Complaint: Headache   HPI: Sean [name] is a 69 y.o. male with h/o small cell lung cancer, CAD with CABG, AICD, V.Fib.  Patient presents to ED with 1 day history of worsening headache.  Located bifrontal area and woke him from sleep this morning.  Gradually worsening throughout day, mild improvement only with oxycodone.  He presents to ED for eval.  In ED he was noted to have marked electrolyte abnormalities and very prolonged QTc of 620.  Review of Systems: Systems reviewed.  As above, otherwise negative  Past Medical History  Diagnosis Date  . Myocardial infarction   . Ventricular tachycardia   . Diabetes mellitus   . COPD (chronic obstructive pulmonary disease)   . CAD (coronary artery disease) Dec 2012    s/p CABG x 3 with AVR; has normal EF  . Aortic stenosis Dec 2012    s/p AVR  . S/P ICD (internal cardiac defibrillator) procedure   . PTSD (post-traumatic stress disorder)   . HTN (hypertension)   . Tobacco abuse    Past Surgical History  Procedure Laterality Date  . Icd implant    . Nose surgery      skin cancer  . Cardiac catheterization    . Coronary angioplasty    . Tee without cardioversion  07/22/2011    Procedure: TRANSESOPHAGEAL ECHOCARDIOGRAM (TEE);  Surgeon: Lelon Perla, MD;  Location: [facility] ENDOSCOPY;  Service: Cardiovascular;  Laterality: N/A;  . Coronary artery bypass graft  07/23/2011    Procedure: CORONARY ARTERY BYPASS GRAFTING (CABG);  Surgeon: Rexene Alberts, MD;  Location: [place];  Service: Open Heart Surgery;  Laterality: N/A;  CABG x 3 using bilateral greater saphenous veins harvested endoscopically  . Aortic valve replacement  07/23/2011    Procedure: AORTIC VALVE REPLACEMENT (AVR);  Surgeon: Rexene Alberts, MD;  Location: [place];  Service: Open Heart Surgery;  Laterality: N/A;  . Left  heart catheterization with coronary angiogram N/A 07/18/2011    Procedure: LEFT HEART CATHETERIZATION WITH CORONARY ANGIOGRAM;  Surgeon: Sherren Mocha, MD;  Location: [facility] CATH LAB;  Service: Cardiovascular;  Laterality: N/A;  . Fractional flow reserve wire N/A 07/18/2011    Procedure: FRACTIONAL FLOW RESERVE WIRE;  Surgeon: Sherren Mocha, MD;  Location: [facility] CATH LAB;  Service: Cardiovascular;  Laterality: N/A;   Social History:  reports that he quit smoking about 3 years ago. He does not have any smokeless tobacco history on file. He reports that he does not drink alcohol or use illicit drugs.  Allergies  Allergen Reactions  . Procaine Hcl Other (See Comments)    unknown  . Rosuvastatin Other (See Comments)    Muscle pain    History reviewed. No pertinent family history.   Prior to Admission medications   Medication Sig Start Date End Date Taking? Authorizing Provider  albuterol (PROVENTIL HFA;VENTOLIN HFA) 108 (90 BASE) MCG/ACT inhaler Inhale 1-2 puffs into the lungs every 6 (six) hours as needed for shortness of breath.    Yes Historical Provider, MD  aspirin 81 MG tablet Take 81 mg by mouth daily.    Yes Historical Provider, MD  atorvastatin (LIPITOR) 80 MG tablet Take 80 mg by mouth every evening.    Yes Historical Provider, MD  furosemide (LASIX) 40 MG tablet Take 1 tablet (40 mg total) by mouth 2 (two) times daily. 03/28/15  Yes  Verner Mould, MD  gabapentin (NEURONTIN) 600 MG tablet Take 600 mg by mouth 3 (three) times daily.    Yes Historical Provider, MD  insulin regular human CONCENTRATED (HUMULIN R) 500 UNIT/ML SOLN injection Inject 0.1 Units into the skin 2 (two) times daily with a meal.    Yes Historical Provider, MD  lisinopril (PRINIVIL,ZESTRIL) 10 MG tablet Take 1 tablet (10 mg total) by mouth daily. 03/28/15 07/09/18 Yes Abigail Delman Cheadle, MD  metoprolol succinate (TOPROL XL) 25 MG 24 hr tablet Take 1 tablet (25 mg total) by mouth daily. 03/28/15  Yes Verner Mould, MD  omeprazole (PRILOSEC) 20 MG capsule Take 20 mg by mouth every evening.   Yes Historical Provider, MD  ondansetron (ZOFRAN) 8 MG tablet Take 8 mg by mouth every 8 (eight) hours as needed for nausea or vomiting.   Yes Historical Provider, MD  oxyCODONE (OXY IR/ROXICODONE) 5 MG immediate release tablet Take 5 mg by mouth every 6 (six) hours as needed for severe pain (pain).   Yes Historical Provider, MD  PARoxetine (PAXIL) 20 MG tablet Take 10 mg by mouth daily.    Yes Historical Provider, MD  tamsulosin (FLOMAX) 0.4 MG CAPS capsule Take 0.4 mg by mouth daily after supper.    Yes Historical Provider, MD  zolpidem (AMBIEN) 10 MG tablet Take 5 mg by mouth at bedtime.   Yes Historical Provider, MD   Physical Exam: Filed Vitals:   04/25/15 0245  BP: 106/67  Pulse: 73  Temp:   Resp: 17    BP 106/67 mmHg  Pulse 73  Temp(Src) 97.9 F (36.6 C) (Oral)  Resp 17  Ht 6' (1.829 m)  Wt 101.606 kg (224 lb)  BMI 30.37 kg/m2  SpO2 91%  General Appearance:    Alert, oriented, no distress, appears stated age  Head:    Normocephalic, atraumatic  Eyes:    PERRL, EOMI, sclera non-icteric        Nose:   Nares without drainage or epistaxis. Mucosa, turbinates normal  Throat:   Moist mucous membranes. Oropharynx without erythema or exudate.  Neck:   Supple. No carotid bruits.  No thyromegaly.  No lymphadenopathy.   Back:     No CVA tenderness, no spinal tenderness  Lungs:     Clear to auscultation bilaterally, without wheezes, rhonchi or rales  Chest wall:    No tenderness to palpitation  Heart:    Regular rate and rhythm without murmurs, gallops, rubs  Abdomen:     Soft, non-tender, nondistended, normal bowel sounds, no organomegaly  Genitalia:    deferred  Rectal:    deferred  Extremities:   No clubbing, cyanosis or edema.  Pulses:   2+ and symmetric all extremities  Skin:   Skin color, texture, turgor normal, no rashes or lesions  Lymph nodes:   Cervical, supraclavicular, and  axillary nodes normal  Neurologic:   CNII-XII intact. Normal strength, sensation and reflexes      throughout    Labs on Admission:  Basic Metabolic Panel:  Recent Labs Lab 04/24/15 2325  NA 133*  K 4.1  CL 91*  CO2 31  GLUCOSE 355*  BUN 13  CREATININE 1.17  CALCIUM 6.2*  MG 0.8*   Liver Function Tests: No results for input(s): AST, ALT, ALKPHOS, BILITOT, PROT, ALBUMIN in the last 168 hours. No results for input(s): LIPASE, AMYLASE in the last 168 hours. No results for input(s): AMMONIA in the last 168 hours. CBC:  Recent Labs Lab  04/24/15 2325  WBC 13.4*  NEUTROABS 10.9*  HGB 9.7*  HCT 29.8*  MCV 88.2  PLT 313   Cardiac Enzymes: No results for input(s): CKTOTAL, CKMB, CKMBINDEX, TROPONINI in the last 168 hours.  BNP (last 3 results) No results for input(s): PROBNP in the last 8760 hours. CBG: No results for input(s): GLUCAP in the last 168 hours.  Radiological Exams on Admission: Ct Head Wo Contrast  04/25/2015   CLINICAL DATA:  New onset headaches. History of Coronary artery disease, aortic stenosis, ventricular fibrillation, lung cancer with bone metastasis on chemotherapy.  EXAM: CT HEAD WITHOUT CONTRAST  TECHNIQUE: Contiguous axial images were obtained from the base of the skull through the vertex without intravenous contrast.  COMPARISON:  07/28/2011  FINDINGS: Mild diffuse cerebral atrophy. Patchy low-attenuation changes in the deep white matter consistent with small vessel ischemia. No ventricular dilatation. No mass effect or midline shift. No abnormal extra-axial fluid collections. Gray-white matter junctions are distinct. Basal cisterns are not effaced. No evidence of acute intracranial hemorrhage. No depressed skull fractures. Mild mucosal thickening in the paranasal sinuses. No acute air-fluid levels. Mastoid air cells are not opacified. Vascular calcifications.  IMPRESSION: No acute intracranial abnormalities. Chronic atrophy and small vessel ischemic  changes.   Electronically Signed   By: Lucienne Capers M.D.   On: 04/25/2015 00:10    EKG: Independently reviewed.  Assessment/Plan Principal Problem:   QT prolongation Active Problems:   Lung cancer   Type 2 diabetes mellitus with other circulatory complications   Hypocalcemia   Hypomagnesemia   Prolonged Q-T interval on ECG   1. QT prolongation - associated with low magnesium 1. Suspect this is primarily worsened by his hypomagnesemia, has critically low magnesium on presentation of 0.8 2. His QTc has improved from 620 on presentation to 522 at time of admission 3. The 522 while still prolonged is consistent with EKGs last month 4. Repeat magnesium and calcium levels after these are done infusing 5. Tele monitor 6. Has AICD in place as well 7. Admitting to SDU due to torsades risk with QTc 620 2. Hypocalcemia and hypomagnesemia - replace as noted above 1. Suspect that this is caused in part by uncontrolled DM 2. But suspect even more that his carboplatin may be contributing to this, carboplatin is known to have these electrolyte abnormalities as a very common side effect. 3. Likely will need calcium and magnesium supplementation chronically 3. DM2 - uncontrolled 1. Hold home insulin 50 units BID WC he takes 2. Putting him on lantus 40 QD 3. And resistant scale SSI AC/HS 4. With 6 unit mealtime coverage 4. Lung CA - chronic, metastatic small cell 5. Headache - 1. Trying to replace electrolytes 2. Getting CT neck 3. CT head is negative 4. Cannot MRI due to AICD in place 5. Has meningismus on exam, but no fever, WBC only 13.4 no other SIRS criteria despite 24 hours of symptoms now 6. curb siding another EDP about LP but Dr. Lita Mains agrees that chance of bacterial meningitis at this point is low with no SIRS other than his chronically elevated WBC. 1. Thinks CT neck is highest yield and that finding a new met seems likely    Code Status: DNR - but still has AICD in place  that is active Family Communication: Wife at bedside Disposition Plan: Admit to inpatient   Time spent: 68 min  Kamie Korber M. [facility] Pager [phone]  If 7AM-7PM, please contact the day team taking care of the patient  [email or web address] Password [email or web address] 04/25/2015, 3:09 AM

## 2015-04-25 NOTE — ED Notes (Signed)
Pt taken to CT.

## 2015-04-25 NOTE — ED Notes (Signed)
Dr. Gardner at bedside 

## 2015-04-25 NOTE — Progress Notes (Signed)
Nutrition Brief Note  Patient identified on the Malnutrition Screening Tool (MST) Report  Wt Readings from Last 15 Encounters:  04/25/15 233 lb (105.688 kg)  03/27/15 225 lb 6.4 oz (102.241 kg)  02/22/15 227 lb (102.967 kg)  02/15/15 232 lb (105.235 kg)  02/10/12 222 lb 12.8 oz (101.061 kg)  09/26/11 214 lb 15.2 oz (97.5 kg)  09/25/11 214 lb (97.07 kg)  09/04/11 210 lb 12.8 oz (95.618 kg)  08/26/11 210 lb (95.255 kg)  08/21/11 208 lb (94.348 kg)  07/31/11 200 lb 12.8 oz (91.082 kg)   Simpson Paulos is a 69 y.o. male with h/o small cell lung cancer, CAD with CABG, AICD, V.Fib. Patient presents to ED with 1 day history of worsening headache. Located bifrontal area and woke him from sleep this morning. Gradually worsening throughout day, mild improvement only with oxycodone. He presents to ED for eval.  Pt admitted with QT prolongation, associated with low magnesium.   Pt was sleeping soundly at time of visit. No family present. Nos signs of fat or muscle or fat depletion noted on exam. Wt hx reviewed; noted wt stability over the past year.   Ensure supplement at bedside; noted about 25% consumed. RD will d/c. Pt consumed 100% of his breakfast this morning.   Body mass index is 31.59 kg/(m^2). Patient meets criteria for obesity, class I based on current BMI.   Current diet order is Carb Modified, patient is consuming approximately 100% of meals at this time. Labs and medications reviewed.   No nutrition interventions warranted at this time. If nutrition issues arise, please consult RD.   Karagan Lehr A. Jimmye Norman, RD, LDN, CDE Pager: [phone] After hours Pager: [phone]

## 2015-04-25 NOTE — Care Management Note (Signed)
Case Management Note  Patient Details  Name: Sean [name] MRN: [id number] Date of Birth: 1945-11-13  Subjective/Objective:      Adm w hypomagnesium, hypocalcemia, prolonged qt              Action/Plan: lives w wife   Expected Discharge Date:                  Expected Discharge Plan:  [place]  In-House Referral:     Discharge planning Services     Post Acute Care Choice:    Choice offered to:     DME Arranged:    DME Agency:     HH Arranged:    [place] Agency:     Status of Service:     Medicare Important Message Given:    Date Medicare IM Given:    Medicare IM give by:    Date Additional Medicare IM Given:    Additional Medicare Important Message give by:     If discussed at [place] of Stay Meetings, dates discussed:    Additional Comments: ur review done  Lacretia Leigh, RN 04/25/2015, 9:29 AM

## 2015-04-25 NOTE — Progress Notes (Signed)
I and O cath done for pt at this time d/t HA w/ movement w/ urine returned of 1300.bladdeer scan done >999 prior.Ca. Level 6.4 and mg 1.4-paged Belenda Cruise S.-awaiting for orders

## 2015-04-25 NOTE — ED Notes (Signed)
Attempted report 

## 2015-04-25 NOTE — Progress Notes (Addendum)
[facility] PROGRESS NOTE  Sean [name] SPQ:[id number] DOB: May 19, 1946 DOA: 04/24/2015 PCP: Pcp Not In System   Same Day Note - Admitted After Midnight  Assessment/Plan: 1. QT prolongation - associated with low magnesium 1. Suspect this is primarily worsened by his hypomagnesemia, has critically low magnesium on presentation of 0.8 2. His QTc has improved from 620 on presentation to 522 at time of admission 3. The 522 while still prolonged is consistent with EKGs last month (QTc was 540 on last EKG) 4. Mg has improved to 1.9. Awaiting iCa level 5. Cont tele 6. Has AICD in place as well 7. Patient was admitted to SDU given concerns of possibly developing torsades given QTC over 600's. As pt has improved, transfer to floor today 2. Hypocalcemia and hypomagnesemia 1. Suspect that this is caused in part by uncontrolled DM 2. But suspect even more that his carboplatin may be contributing to this, carboplatin is known to have these electrolyte abnormalities as a very common side effect. 3. Being replaced as per above 3. DM2 - uncontrolled 1. Hold home insulin 50 units BID WC he takes 2. Putting him on lantus 40 QD 3. And resistant scale SSI AC/HS 4. With 6 unit mealtime coverage 4. Lung CA - chronic, metastatic small cell 5. Headache - 1. Getting CT neck 2. CT head is negative 3. Cannot MRI due to AICD in place 4. Has meningismus on exam, but no fever, WBC only 13.4 no other SIRS criteria despite 24 hours of symptoms now 5. Admitting MD had curb sided EDP about LP but Dr. Lita Mains agrees that chance of bacterial meningitis at this point is low with no SIRS other than his chronically elevated WBC. 6. Cont analgesics as tolerated  Code Status: DNR Family Communication: Pt in room Disposition Plan: Pending  Consultants:    Procedures:    Antibiotics:    HPI/Subjective: Complains of neck pains. Otherwise states feeling better today.  Objective: Filed Vitals:   04/25/15 0900 04/25/15 1000 04/25/15 1100 04/25/15 1200  BP: 154/54 137/104 101/48 103/57  Pulse: 79 80 81 82  Temp:   97.4 F (36.3 C)   TempSrc:   Oral   Resp: '19 23 20 23  '$ Height:      Weight:      SpO2: 96% 96% 91% 92%    Intake/Output Summary (Last 24 hours) at 04/25/15 1454 Last data filed at 04/25/15 1012  Gross per 24 hour  Intake    520 ml  Output   1300 ml  Net   -780 ml   Filed Weights   04/24/15 2048 04/25/15 0440  Weight: 101.606 kg (224 lb) 105.688 kg (233 lb)    Exam:   General:  Awake, in nad  Cardiovascular: regular, s1, s2  Respiratory: normal resp effort, no wheezing  Abdomen: soft,nondistended  Musculoskeletal: perfused, no clubbing   Data Reviewed: Basic Metabolic Panel:  Recent Labs Lab 04/24/15 2325 04/25/15 0311 04/25/15 0517 04/25/15 0920 04/25/15 1230  NA 133*  --  135  --   --   K 4.1  --  3.9  --   --   CL 91*  --  94*  --   --   CO2 31  --  30  --   --   GLUCOSE 355*  --  314*  --   --   BUN 13  --  11  --   --   CREATININE 1.17  --  1.02  --   --  CALCIUM 6.2*  --  6.3* 6.3*  --   MG 0.8* 1.4*  --  1.3* 1.9   Liver Function Tests: No results for input(s): AST, ALT, ALKPHOS, BILITOT, PROT, ALBUMIN in the last 168 hours. No results for input(s): LIPASE, AMYLASE in the last 168 hours. No results for input(s): AMMONIA in the last 168 hours. CBC:  Recent Labs Lab 04/24/15 2325 04/25/15 0517  WBC 13.4* 12.4*  NEUTROABS 10.9*  --   HGB 9.7* 9.1*  HCT 29.8* 29.2*  MCV 88.2 89.6  PLT 313 297   Cardiac Enzymes: No results for input(s): CKTOTAL, CKMB, CKMBINDEX, TROPONINI in the last 168 hours. BNP (last 3 results)  Recent Labs  03/26/15 1413  BNP 936.2*    ProBNP (last 3 results) No results for input(s): PROBNP in the last 8760 hours.  CBG:  Recent Labs Lab 04/25/15 0809  GLUCAP 282*    Recent Results (from the past 240 hour(s))  MRSA PCR Screening     Status: None   Collection Time: 04/25/15  5:07  AM  Result Value Ref Range Status   MRSA by PCR NEGATIVE NEGATIVE Final    Comment:        The GeneXpert MRSA Assay (FDA approved for NASAL specimens only), is one component of a comprehensive MRSA colonization surveillance program. It is not intended to diagnose MRSA infection nor to guide or monitor treatment for MRSA infections.      Studies: Ct Head Wo Contrast  04/25/2015   CLINICAL DATA:  New onset headaches. History of Coronary artery disease, aortic stenosis, ventricular fibrillation, lung cancer with bone metastasis on chemotherapy.  EXAM: CT HEAD WITHOUT CONTRAST  TECHNIQUE: Contiguous axial images were obtained from the base of the skull through the vertex without intravenous contrast.  COMPARISON:  07/28/2011  FINDINGS: Mild diffuse cerebral atrophy. Patchy low-attenuation changes in the deep white matter consistent with small vessel ischemia. No ventricular dilatation. No mass effect or midline shift. No abnormal extra-axial fluid collections. Gray-white matter junctions are distinct. Basal cisterns are not effaced. No evidence of acute intracranial hemorrhage. No depressed skull fractures. Mild mucosal thickening in the paranasal sinuses. No acute air-fluid levels. Mastoid air cells are not opacified. Vascular calcifications.  IMPRESSION: No acute intracranial abnormalities. Chronic atrophy and small vessel ischemic changes.   Electronically Signed   By: Lucienne Capers M.D.   On: 04/25/2015 00:10   Ct Cervical Spine Wo Contrast  04/25/2015   CLINICAL DATA:  69 year old male with right-sided neck pain. No known injury.  EXAM: CT CERVICAL SPINE WITHOUT CONTRAST  TECHNIQUE: Multidetector CT imaging of the cervical spine was performed without intravenous contrast. Multiplanar CT image reconstructions were also generated.  COMPARISON:  None.  FINDINGS: Evaluation of this exam is limited due to respiratory motion artifact.  There is no acute fracture or subluxation of the cervical  spine.There are multilevel degenerative changes.The odontoid and spinous processes are intact.There is normal anatomic alignment of the C1-C2 lateral masses. The visualized soft tissues appear unremarkable.  Left pectoral pacemaker device partially visualized. Partially visualized right pleural effusion. Bilateral pulmonary airspace opacity more prominent on the right concerning for pneumonia. Clinical correlation is recommended. CT of the chest may provide better evaluation of the lungs if clinically indicated. Apparent density within the trachea may be related to motion artifact or aspirated material.  IMPRESSION: No acute fracture or subluxation of the cervical spine.  Small partially visualized right pleural effusion with bilateral upper lobe airspace opacity, more prominent on the  right concerning for pneumonia. Clinical correlation is recommended.  Motion artifact versus aspirated material within the trachea.   Electronically Signed   By: Anner Crete M.D.   On: 04/25/2015 04:06   Dg Chest Port 1 View  04/25/2015   CLINICAL DATA:  Pneumonia.  EXAM: PORTABLE CHEST - 1 VIEW  COMPARISON:  None.  FINDINGS: Mediastinum hilar structures normal. Cardiac pacer noted in stable position. Prior CABG. Cardiomegaly with pulmonary vascular prominence bilateral interstitial prominence consistent with congestive heart failure . Mild interim clearing of pulmonary edema noted. No pneumothorax  IMPRESSION: 1. Cardiac pacer stable position.  Prior CABG. 2. Congestive heart failure bilateral pulmonary interstitial edema. Partial clearing from prior exam .   Electronically Signed   By: Marcello Moores  Register   On: 04/25/2015 09:57    Scheduled Meds: . aspirin EC  81 mg Oral Daily  . atorvastatin  80 mg Oral QPM  . enoxaparin (LOVENOX) injection  40 mg Subcutaneous Daily  . gabapentin  600 mg Oral TID  . insulin aspart  0-20 Units Subcutaneous TID WC  . insulin aspart  6 Units Subcutaneous TID WC  . insulin glargine  40  Units Subcutaneous Daily  . lisinopril  10 mg Oral Daily  . metoprolol succinate  25 mg Oral Daily  . pantoprazole  40 mg Oral Daily  . PARoxetine  10 mg Oral Daily  . sodium chloride  3 mL Intravenous Q12H  . tamsulosin  0.4 mg Oral QPC supper   Continuous Infusions:   Principal Problem:   QT prolongation Active Problems:   Lung cancer   Type 2 diabetes mellitus with other circulatory complications   Hypocalcemia   Hypomagnesemia   Prolonged Q-T interval on ECG   Telicia Hodgkiss K  [facility] Pager [phone]. If 7PM-7AM, please contact night-coverage at [email or web address], password [facility] 04/25/2015, 2:54 PM  LOS: 0 days

## 2015-04-25 NOTE — Progress Notes (Signed)
CRITICAL VALUE ALERT  Critical value received:  Ca 6.3 Mag 1.3  Date of notification:  04/25/2015   Time of notification:  0920  Critical value read back:Yes.    Nurse who received alert:  Rosebud Poles RN  MD notified (1st page):  Dr. Wyline Copas  Time of first page:  0930  Responding MD:  Dr. Wyline Copas  Time MD responded:  0930  Lab had been collected prior to infusions of Mag and Ca finishing. Will recollect.

## 2015-04-25 NOTE — ED Notes (Signed)
Pt given water to drink.  Pt sitting on side of stretcher.  St's no relief from pain med

## 2015-04-26 ENCOUNTER — Inpatient Hospital Stay (HOSPITAL_COMMUNITY): Payer: Non-veteran care

## 2015-04-26 DIAGNOSIS — I4581 Long QT syndrome: Secondary | ICD-10-CM

## 2015-04-26 DIAGNOSIS — E1159 Type 2 diabetes mellitus with other circulatory complications: Secondary | ICD-10-CM

## 2015-04-26 DIAGNOSIS — C3490 Malignant neoplasm of unspecified part of unspecified bronchus or lung: Secondary | ICD-10-CM

## 2015-04-26 LAB — HEPATIC FUNCTION PANEL
ALBUMIN: 2.9 g/dL — AB (ref 3.5–5.0)
ALK PHOS: 94 U/L (ref 38–126)
ALT: 13 U/L — AB (ref 17–63)
AST: 21 U/L (ref 15–41)
BILIRUBIN TOTAL: 1 mg/dL (ref 0.3–1.2)
Bilirubin, Direct: 0.2 mg/dL (ref 0.1–0.5)
Indirect Bilirubin: 0.8 mg/dL (ref 0.3–0.9)
Total Protein: 6.9 g/dL (ref 6.5–8.1)

## 2015-04-26 LAB — GLUCOSE, CAPILLARY
GLUCOSE-CAPILLARY: 286 mg/dL — AB (ref 65–99)
GLUCOSE-CAPILLARY: 360 mg/dL — AB (ref 65–99)
Glucose-Capillary: 234 mg/dL — ABNORMAL HIGH (ref 65–99)
Glucose-Capillary: 146 mg/dL — ABNORMAL HIGH (ref 65–99)
Glucose-Capillary: 233 mg/dL — ABNORMAL HIGH (ref 65–99)

## 2015-04-26 LAB — CALCIUM, IONIZED: CALCIUM, IONIZED, SERUM: 3.4 mg/dL — AB (ref 4.5–5.6)

## 2015-04-26 LAB — CALCIUM: Calcium: 6.8 mg/dL — ABNORMAL LOW (ref 8.9–10.3)

## 2015-04-26 MED ORDER — MAGNESIUM OXIDE 400 (241.3 MG) MG PO TABS
400.0000 mg | ORAL_TABLET | Freq: Two times a day (BID) | ORAL | Status: DC
  Administered 2015-04-26 – 2015-04-28 (×5): 400 mg via ORAL
  Filled 2015-04-26 (×5): qty 1

## 2015-04-26 MED ORDER — CALCIUM CARBONATE 1250 (500 CA) MG PO TABS
1.0000 | ORAL_TABLET | Freq: Two times a day (BID) | ORAL | Status: DC
  Administered 2015-04-26 – 2015-04-28 (×4): 500 mg via ORAL
  Filled 2015-04-26 (×4): qty 1

## 2015-04-26 MED ORDER — FUROSEMIDE 10 MG/ML IJ SOLN
40.0000 mg | Freq: Two times a day (BID) | INTRAMUSCULAR | Status: DC
  Administered 2015-04-26 – 2015-04-27 (×4): 40 mg via INTRAVENOUS
  Filled 2015-04-26 (×5): qty 4

## 2015-04-26 MED ORDER — POTASSIUM CHLORIDE CRYS ER 20 MEQ PO TBCR
40.0000 meq | EXTENDED_RELEASE_TABLET | Freq: Once | ORAL | Status: AC
  Administered 2015-04-26: 40 meq via ORAL
  Filled 2015-04-26: qty 2

## 2015-04-26 MED ORDER — ACETAMINOPHEN 650 MG RE SUPP
650.0000 mg | RECTAL | Status: DC | PRN
  Administered 2015-04-26: 650 mg via RECTAL
  Filled 2015-04-26 (×2): qty 1

## 2015-04-26 NOTE — Progress Notes (Signed)
Inpatient Diabetes Program Recommendations  AACE/ADA: New Consensus Statement on Inpatient Glycemic Control (2013)  Target Ranges:  Prepandial:   less than 140 mg/dL      Peak postprandial:   less than 180 mg/dL (1-2 hours)      Critically ill patients:  140 - 180 mg/dL   Basal insulin lantus at 40 units appears to be effective as fasting cbg's controlled. However glucose elevates into 200's before lunch and supper  Pt requiring high doses of correction scale in addition to meal coverage of 6 units. Recommend to decrease need for high doses of correction, please consider increase in meal coverage and decrease correction scale as follows: Inpatient Diabetes Program Recommendations Correction (SSI): Please decrease to sensitive correction tidwc Insulin - Meal Coverage: Please increase novolog meal coverage to 8 units tidwc   Thank you Rosita Kea, RN, MSN, CDE  Diabetes Inpatient Program Office: [phone] Pager: [phone] 8:00 am to 5:00 pm

## 2015-04-26 NOTE — Progress Notes (Addendum)
Called per floor RN at 0100 for Pt with low 02 sats, 80% on 6 LNC. RN advised to place Pt on NRB while RRT en route. Upon my arrival at 0105 Pt found resting in bed, no apparent distress, sleeping. Pt awakens to voice , lethargic, mumbles some words, follows simple commands. Lungs with dissuse rhonchi in upper lobes, diminished clear in bases. 02 sats 100% on NRB. Pt denies pain and appears comfortable.  Axillary temp obtained yielding 102.5. Pt DNR, no aggressive care per family at bedside.Alanda Slim paged and updated per floor RN, tylenol PR ordered no further orders at this time. RN advised to monitor Pt closely and wean 02 as possible. RRT will follow tonight.

## 2015-04-26 NOTE — Progress Notes (Signed)
[facility] PROGRESS NOTE  Alyan Hartline GUY:[id number] DOB: 02-24-1946 DOA: 04/24/2015  PCP: Pcp Not In System  Brief HPI: 69 year old Caucasian male with the past medical history of metastatic small cell lung cancer, coronary artery disease status post CABG, AICD placement for ventricular fibrillation who presented to the emergency department with complaints of headache. Patient was noted to have hypomagnesemia, hypocalcemia, and a prolonged QT interval. He was hospitalized for further management.  Past medical history:  Past Medical History  Diagnosis Date  . Myocardial infarction   . Ventricular tachycardia   . Diabetes mellitus   . COPD (chronic obstructive pulmonary disease)   . CAD (coronary artery disease) Dec 2012    s/p CABG x 3 with AVR; has normal EF  . Aortic stenosis Dec 2012    s/p AVR  . S/P ICD (internal cardiac defibrillator) procedure   . PTSD (post-traumatic stress disorder)   . HTN (hypertension)   . Tobacco abuse     Consultants: None  Procedures: None  Antibiotics: None  Subjective: Patient states that he gets the pain in his neck area and the back of his head, only when he moves his head. Currently denies any significant pain. He also got short of breath overnight. Improved this morning. Denies any chest pain. His wife and son are at bedside.  Objective: Vital Signs  Filed Vitals:   04/26/15 0055 04/26/15 0115 04/26/15 0434 04/26/15 0758  BP: 96/50  107/59 94/60  Pulse: 99  68 72  Temp: 100 F (37.8 C) 102.5 F (39.2 C) 98.3 F (36.8 C) 98.3 F (36.8 C)  TempSrc: Oral Axillary Oral Oral  Resp: '20  21 19  '$ Height:      Weight:      SpO2: 79% 100% 100% 98%    Intake/Output Summary (Last 24 hours) at 04/26/15 1049 Last data filed at 04/26/15 0858  Gross per 24 hour  Intake   1230 ml  Output    950 ml  Net    280 ml   Filed Weights   04/24/15 2048 04/25/15 0440 04/25/15 2034  Weight: 101.606 kg (224 lb) 105.688 kg (233 lb)  105.8 kg (233 lb 4 oz)    General appearance: alert, cooperative, appears stated age and no distress Resp: Crackles bilaterally about one third of the lung fields Cardio: regular rate and rhythm, S1, S2 normal, no murmur, click, rub or gallop GI: soft, non-tender; bowel sounds normal; no masses,  no organomegaly Extremities: extremities normal, atraumatic, no cyanosis or edema Neurologic: No focal deficits  Lab Results:  Basic Metabolic Panel:  Recent Labs Lab 04/24/15 2325 04/25/15 0311 04/25/15 0517 04/25/15 0920 04/25/15 1230 04/25/15 1615 04/26/15 0530  NA 133*  --  135  --   --  135  --   K 4.1  --  3.9  --   --  3.8  --   CL 91*  --  94*  --   --  95*  --   CO2 31  --  30  --   --  29  --   GLUCOSE 355*  --  314*  --   --  151*  --   BUN 13  --  11  --   --  11  --   CREATININE 1.17  --  1.02  --   --  0.93  --   CALCIUM 6.2*  --  6.3* 6.3*  --  6.7* 6.8*  MG 0.8* 1.4*  --  1.3* 1.9  --   --    CBC:  Recent Labs Lab 04/24/15 2325 04/25/15 0517  WBC 13.4* 12.4*  NEUTROABS 10.9*  --   HGB 9.7* 9.1*  HCT 29.8* 29.2*  MCV 88.2 89.6  PLT 313 297   BNP (last 3 results)  Recent Labs  03/26/15 1413  BNP 936.2*    CBG:  Recent Labs Lab 04/25/15 0809 04/25/15 1144 04/25/15 1630 04/25/15 2027 04/26/15 0735  GLUCAP 282* 234* 142* 93 146*    Recent Results (from the past 240 hour(s))  MRSA PCR Screening     Status: None   Collection Time: 04/25/15  5:07 AM  Result Value Ref Range Status   MRSA by PCR NEGATIVE NEGATIVE Final    Comment:        The GeneXpert MRSA Assay (FDA approved for NASAL specimens only), is one component of a comprehensive MRSA colonization surveillance program. It is not intended to diagnose MRSA infection nor to guide or monitor treatment for MRSA infections.       Studies/Results: Ct Head Wo Contrast  04/25/2015   CLINICAL DATA:  New onset headaches. History of Coronary artery disease, aortic stenosis, ventricular  fibrillation, lung cancer with bone metastasis on chemotherapy.  EXAM: CT HEAD WITHOUT CONTRAST  TECHNIQUE: Contiguous axial images were obtained from the base of the skull through the vertex without intravenous contrast.  COMPARISON:  07/28/2011  FINDINGS: Mild diffuse cerebral atrophy. Patchy low-attenuation changes in the deep white matter consistent with small vessel ischemia. No ventricular dilatation. No mass effect or midline shift. No abnormal extra-axial fluid collections. Gray-white matter junctions are distinct. Basal cisterns are not effaced. No evidence of acute intracranial hemorrhage. No depressed skull fractures. Mild mucosal thickening in the paranasal sinuses. No acute air-fluid levels. Mastoid air cells are not opacified. Vascular calcifications.  IMPRESSION: No acute intracranial abnormalities. Chronic atrophy and small vessel ischemic changes.   Electronically Signed   By: Lucienne Capers M.D.   On: 04/25/2015 00:10   Ct Cervical Spine Wo Contrast  04/25/2015   CLINICAL DATA:  69 year old male with right-sided neck pain. No known injury.  EXAM: CT CERVICAL SPINE WITHOUT CONTRAST  TECHNIQUE: Multidetector CT imaging of the cervical spine was performed without intravenous contrast. Multiplanar CT image reconstructions were also generated.  COMPARISON:  None.  FINDINGS: Evaluation of this exam is limited due to respiratory motion artifact.  There is no acute fracture or subluxation of the cervical spine.There are multilevel degenerative changes.The odontoid and spinous processes are intact.There is normal anatomic alignment of the C1-C2 lateral masses. The visualized soft tissues appear unremarkable.  Left pectoral pacemaker device partially visualized. Partially visualized right pleural effusion. Bilateral pulmonary airspace opacity more prominent on the right concerning for pneumonia. Clinical correlation is recommended. CT of the chest may provide better evaluation of the lungs if clinically  indicated. Apparent density within the trachea may be related to motion artifact or aspirated material.  IMPRESSION: No acute fracture or subluxation of the cervical spine.  Small partially visualized right pleural effusion with bilateral upper lobe airspace opacity, more prominent on the right concerning for pneumonia. Clinical correlation is recommended.  Motion artifact versus aspirated material within the trachea.   Electronically Signed   By: Anner Crete M.D.   On: 04/25/2015 04:06   Dg Chest Port 1 View  04/25/2015   CLINICAL DATA:  Pneumonia.  EXAM: PORTABLE CHEST - 1 VIEW  COMPARISON:  None.  FINDINGS: Mediastinum hilar structures normal. Cardiac pacer  noted in stable position. Prior CABG. Cardiomegaly with pulmonary vascular prominence bilateral interstitial prominence consistent with congestive heart failure . Mild interim clearing of pulmonary edema noted. No pneumothorax  IMPRESSION: 1. Cardiac pacer stable position.  Prior CABG. 2. Congestive heart failure bilateral pulmonary interstitial edema. Partial clearing from prior exam .   Electronically Signed   By: Marcello Moores  Register   On: 04/25/2015 09:57    Medications:  Scheduled: . aspirin EC  81 mg Oral Daily  . atorvastatin  80 mg Oral QPM  . enoxaparin (LOVENOX) injection  40 mg Subcutaneous Daily  . furosemide  40 mg Intravenous Q12H  . gabapentin  600 mg Oral TID  . insulin aspart  0-20 Units Subcutaneous TID WC  . insulin aspart  6 Units Subcutaneous TID WC  . insulin glargine  40 Units Subcutaneous Daily  . lisinopril  10 mg Oral Daily  . magnesium oxide  400 mg Oral BID  . metoprolol succinate  25 mg Oral Daily  . pantoprazole  40 mg Oral Daily  . PARoxetine  10 mg Oral Daily  . sodium chloride  3 mL Intravenous Q12H  . tamsulosin  0.4 mg Oral QPC supper   Continuous:  WIO:[id number], albuterol, HYDROmorphone (DILAUDID) injection, ondansetron, oxyCODONE  Assessment/Plan:  Principal Problem:   QT  prolongation Active Problems:   Lung cancer   Type 2 diabetes mellitus with other circulatory complications   Hypocalcemia   Hypomagnesemia   Prolonged Q-T interval on ECG    QT prolongation Likely secondary to hypomagnesemia. This has been aggressively repleted. QT interval has improved. Repeat EKG today.  Electrolyte abnormalities Including hypocalcemia and hypomagnesemia. Patient is getting chemotherapy for lung cancer. Chemotherapeutic agents could be contributing. Corrected calcium is 7.6. Magnesium is corrected. Initiate calcium carbonate.  Small cell lung cancer with metastases Discussed with the patient and his wife. Patient has received 4 cycles of chemotherapy with carboplatin and etoposide. I have also called and discussed with his oncologist, Dr. Sabra Heck at the [facility] in [place]. He tells me that patient had a repeat CT scan last week after 4 cycles of chemotherapy which did not show any improvement in his lung cancer and metastatic burden. He continues to have significant lesions in his liver, bones. He feels at this time that the patient has poor prognosis and would be a candidate for hospice. Patient and his family to discuss this further. They will get back to me later today.  Acute systolic congestive heart failure Recent echocardiogram showed EF of 25%. Patient does have a history of coronary artery disease. This decline in ejection fraction is new. Question if this is secondary to chemotherapy. But considering his overall poor prognosis due to cancer no aggressive intervention at this time. Continue IV Lasix for symptomatic relief.  History of type 2 diabetes Continue sliding scale coverage and Lantus.  Headache and neck pain Imaging studies did not show any metastatic process or other acute findings. Pain control will be mainstay. Cannot have MRI due to AICD. Unlikely to be infectious etiology.  Normocytic Anemia Likely secondary to chronic disease. Continue to  monitor.  DVT Prophylaxis: Lovenox    Code Status: DO NOT RESUSCITATE  Family Communication: Discussed with the patient and his wife along with his son   Disposition Plan: Continue current management. Disposition to be determined based on family discussion. Will likely need to initiate hospice care.    LOS: 1 day   [facility] Pager [phone] 04/26/2015, 10:49 AM  If  7PM-7AM, please contact night-coverage at [email or web address], password [facility]

## 2015-04-26 NOTE — Progress Notes (Signed)
Discussed with patient high CBG. He is eating cheetos and trail mix and has pretzels at bedside. Pt states he just feels hungry tonight and does not feel he needs for his blood sugar to be covered with insulin. Pt is educated on importance of his diet and insulin compliance. Will continue to monitor.

## 2015-04-27 DIAGNOSIS — Z515 Encounter for palliative care: Secondary | ICD-10-CM | POA: Insufficient documentation

## 2015-04-27 DIAGNOSIS — M542 Cervicalgia: Secondary | ICD-10-CM | POA: Insufficient documentation

## 2015-04-27 DIAGNOSIS — I5021 Acute systolic (congestive) heart failure: Secondary | ICD-10-CM

## 2015-04-27 LAB — BASIC METABOLIC PANEL
Anion gap: 11 (ref 5–15)
BUN: 18 mg/dL (ref 6–20)
CHLORIDE: 91 mmol/L — AB (ref 101–111)
CO2: 29 mmol/L (ref 22–32)
Calcium: 7.2 mg/dL — ABNORMAL LOW (ref 8.9–10.3)
Creatinine, Ser: 1.12 mg/dL (ref 0.61–1.24)
Glucose, Bld: 381 mg/dL — ABNORMAL HIGH (ref 65–99)
POTASSIUM: 3.9 mmol/L (ref 3.5–5.1)
SODIUM: 131 mmol/L — AB (ref 135–145)

## 2015-04-27 LAB — GLUCOSE, CAPILLARY
GLUCOSE-CAPILLARY: 360 mg/dL — AB (ref 65–99)
GLUCOSE-CAPILLARY: 124 mg/dL — AB (ref 65–99)
GLUCOSE-CAPILLARY: 135 mg/dL — AB (ref 65–99)
Glucose-Capillary: 265 mg/dL — ABNORMAL HIGH (ref 65–99)

## 2015-04-27 LAB — CBC
HCT: 27.2 % — ABNORMAL LOW (ref 39.0–52.0)
HEMOGLOBIN: 8.5 g/dL — AB (ref 13.0–17.0)
MCH: 27.8 pg (ref 26.0–34.0)
MCHC: 31.3 g/dL (ref 30.0–36.0)
MCV: 88.9 fL (ref 78.0–100.0)
PLATELETS: 350 10*3/uL (ref 150–400)
RBC: 3.06 MIL/uL — AB (ref 4.22–5.81)
RDW: 19 % — ABNORMAL HIGH (ref 11.5–15.5)
WBC: 11.9 10*3/uL — AB (ref 4.0–10.5)

## 2015-04-27 LAB — MAGNESIUM: MAGNESIUM: 1.5 mg/dL — AB (ref 1.7–2.4)

## 2015-04-27 MED ORDER — TRAZODONE HCL 50 MG PO TABS
50.0000 mg | ORAL_TABLET | Freq: Every day | ORAL | Status: DC
  Administered 2015-04-27: 50 mg via ORAL
  Filled 2015-04-27: qty 1

## 2015-04-27 MED ORDER — INSULIN ASPART 100 UNIT/ML ~~LOC~~ SOLN
8.0000 [IU] | Freq: Three times a day (TID) | SUBCUTANEOUS | Status: DC
  Administered 2015-04-27 – 2015-04-28 (×4): 8 [IU] via SUBCUTANEOUS

## 2015-04-27 MED ORDER — ALPRAZOLAM 0.5 MG PO TABS
0.5000 mg | ORAL_TABLET | Freq: Three times a day (TID) | ORAL | Status: DC | PRN
  Administered 2015-04-27: 0.5 mg via ORAL
  Filled 2015-04-27: qty 1

## 2015-04-27 MED ORDER — INSULIN GLARGINE 100 UNIT/ML ~~LOC~~ SOLN
60.0000 [IU] | Freq: Every day | SUBCUTANEOUS | Status: DC
  Administered 2015-04-27 – 2015-04-28 (×2): 60 [IU] via SUBCUTANEOUS
  Filled 2015-04-27 (×2): qty 0.6

## 2015-04-27 NOTE — Consult Note (Signed)
Consultation Note Date: 04/27/2015   Patient Name: Sean [name]  DOB: 23-Dec-1945  MRN: [id number]  Age / Sex: 69 y.o., male   PCP: Pcp Not In System Referring Physician: Bonnielee Haff, MD  Reason for Consultation: Establishing goals of care and Psychosocial/spiritual support  Palliative Care Assessment and Plan Summary of Established Goals of Care and Medical Treatment Preferences   Clinical Assessment/Narrative: Sean [name] is resting quietly in bed today.  His wife, Sean [name], and son, Sean [name] join [place] after a few minutes.  Sean [name] talks about his medical history, and his cancer treatments.  Sean [name] tells me about his Oncologist at the [place] letting them know that the CT scan showed no improvements and that chemo therapy was not helping;  there was no need to continue.  Sean [name] states he was very sick after his first treatments, but this improved with second treatments.  Sean [name] talks about his son Sean [name] having a hard time accepting that he is dying.  He talks about his siblings (he is the youngest)  There are now 5 of 8 left.  He talks about his pets at home, Sean [name] and a black cat.   Sean [name] agrees when I say that I have heard they want to go home with Hospice.   Sean [name] asks about going with hospice and then not being able to call 911. I encourage her that she will have a Hospice RN to call,  who then may be able to come to the home for a face to face evaluation,  to help them with symptom management.  She is relieved by this information.  She also asks about turing off the defib and I encouraged her to talk with her hospice provider as they can do this easily at home. Sean [name] and son state their biggest worry is that they don't want Sean [name] to suffer.  We talk about the support they will receive from hospice.   Sean [name] states his worry is for Sean [name], that she will get the support she needs, and that he doesn't want her to have "pain and misery".  We talk about the support that hospice will  provide ongoing. He states, "I know Jesus".   We talk about HF management including low salt diet, fluid restrictions and extra fluid pills, but also that he should enjoy his time, being mindful of symptoms.  We also talk about pain management.   Contacts/Participants in Discussion: Primary Decision Maker: Sean [name] is able to make his own decisions. Has supportive family with wife, Sean [name], son Sean [name] at bedside today.   HCPOA: no   Code Status/Advance Care Planning:  DNR, has goldenrod form.   Symptom Management:   Oxycodone 5 mg PO Q 6 hours PRN.    We talk about morphine use with end of life care, and importance of giving scheduled medication.   Palliative Prophylaxis:  NONE recommend Senna-S 1-2 tabs BID.    Psycho-social/Spiritual:   Support System:  Lives with wife, Sean [name].  Has supportive family. Son Sean [name] in [place], Sean [name] in [place], and Sean [name] also in [place].   Prognosis: < 6 months, likely less than 2 months.   Discharge Planning:  Home with Hospice       Chief Complaint:  Headache History of Present Illness:  Sean [name] is a 69 y.o. male with h/o small cell lung cancer, CAD with CABG, AICD, V.Fib. Patient presents to ED with 1 day history of worsening headache. Located bifrontal area and woke him  from sleep this morning. Gradually worsening throughout day, mild improvement only with oxycodone. He presents to ED for eval.    In ED he was noted to have marked electrolyte abnormalities and very prolonged QTc of 620.  Primary Diagnoses  Present on Admission:  . QT prolongation . Type 2 diabetes mellitus with other circulatory complications . Lung cancer . Hypocalcemia . Hypomagnesemia . Prolonged Q-T interval on ECG  Palliative Review of Systems: Sean [name] denies uncontrolled pain, NV, dyspnea, or anxiety at this time.   I have reviewed the medical record, interviewed the patient and family, and examined the patient. The following aspects are pertinent.  Past  Medical History  Diagnosis Date  . Myocardial infarction   . Ventricular tachycardia   . Diabetes mellitus   . COPD (chronic obstructive pulmonary disease)   . CAD (coronary artery disease) Dec 2012    s/p CABG x 3 with AVR; has normal EF  . Aortic stenosis Dec 2012    s/p AVR  . S/P ICD (internal cardiac defibrillator) procedure   . PTSD (post-traumatic stress disorder)   . HTN (hypertension)   . Tobacco abuse    Social History   Social History  . Marital Status: Married    Spouse Name: N/A  . Number of Children: N/A  . Years of Education: N/A   Social History Main Topics  . Smoking status: Former Smoker -- 1.00 packs/day    Quit date: 07/16/2011  . Smokeless tobacco: None  . Alcohol Use: No  . Drug Use: No  . Sexual Activity: Not Currently   Other Topics Concern  . None   Social History Narrative   History reviewed. No pertinent family history. Scheduled Meds: . aspirin EC  81 mg Oral Daily  . atorvastatin  80 mg Oral QPM  . calcium carbonate  1 tablet Oral BID WC  . enoxaparin (LOVENOX) injection  40 mg Subcutaneous Daily  . furosemide  40 mg Intravenous Q12H  . gabapentin  600 mg Oral TID  . insulin aspart  0-20 Units Subcutaneous TID WC  . insulin aspart  8 Units Subcutaneous TID WC  . insulin glargine  60 Units Subcutaneous Daily  . lisinopril  10 mg Oral Daily  . magnesium oxide  400 mg Oral BID  . metoprolol succinate  25 mg Oral Daily  . pantoprazole  40 mg Oral Daily  . PARoxetine  10 mg Oral Daily  . sodium chloride  3 mL Intravenous Q12H  . tamsulosin  0.4 mg Oral QPC supper   Continuous Infusions:  PRN Meds:.acetaminophen, albuterol, ALPRAZolam, HYDROmorphone (DILAUDID) injection, ondansetron, oxyCODONE Medications Prior to Admission:  Prior to Admission medications   Medication Sig Start Date End Date Taking? Authorizing Provider  albuterol (PROVENTIL HFA;VENTOLIN HFA) 108 (90 BASE) MCG/ACT inhaler Inhale 1-2 puffs into the lungs every 6 (six)  hours as needed for shortness of breath.    Yes Historical Provider, MD  aspirin 81 MG tablet Take 81 mg by mouth daily.    Yes Historical Provider, MD  atorvastatin (LIPITOR) 80 MG tablet Take 80 mg by mouth every evening.    Yes Historical Provider, MD  furosemide (LASIX) 40 MG tablet Take 1 tablet (40 mg total) by mouth 2 (two) times daily. 03/28/15  Yes Verner Mould, MD  gabapentin (NEURONTIN) 600 MG tablet Take 600 mg by mouth 3 (three) times daily.    Yes Historical Provider, MD  insulin regular human CONCENTRATED (HUMULIN R) 500 UNIT/ML SOLN injection Inject  0.1 Units into the skin 2 (two) times daily with a meal.    Yes Historical Provider, MD  lisinopril (PRINIVIL,ZESTRIL) 10 MG tablet Take 1 tablet (10 mg total) by mouth daily. 03/28/15 07/09/18 Yes Abigail Delman Cheadle, MD  metoprolol succinate (TOPROL XL) 25 MG 24 hr tablet Take 1 tablet (25 mg total) by mouth daily. 03/28/15  Yes Verner Mould, MD  omeprazole (PRILOSEC) 20 MG capsule Take 20 mg by mouth every evening.   Yes Historical Provider, MD  ondansetron (ZOFRAN) 8 MG tablet Take 8 mg by mouth every 8 (eight) hours as needed for nausea or vomiting.   Yes Historical Provider, MD  oxyCODONE (OXY IR/ROXICODONE) 5 MG immediate release tablet Take 5 mg by mouth every 6 (six) hours as needed for severe pain (pain).   Yes Historical Provider, MD  PARoxetine (PAXIL) 20 MG tablet Take 10 mg by mouth daily.    Yes Historical Provider, MD  tamsulosin (FLOMAX) 0.4 MG CAPS capsule Take 0.4 mg by mouth daily after supper.    Yes Historical Provider, MD  zolpidem (AMBIEN) 10 MG tablet Take 5 mg by mouth at bedtime.   Yes Historical Provider, MD   Allergies  Allergen Reactions  . Procaine Hcl Other (See Comments)    unknown  . Rosuvastatin Other (See Comments)    Muscle pain   CBC:    Component Value Date/Time   WBC 11.9* 04/27/2015 0620   HGB 8.5* 04/27/2015 0620   HCT 27.2* 04/27/2015 0620   PLT 350 04/27/2015  0620   MCV 88.9 04/27/2015 0620   NEUTROABS 10.9* 04/24/2015 2325   LYMPHSABS 1.5 04/24/2015 2325   MONOABS 0.9 04/24/2015 2325   EOSABS 0.2 04/24/2015 2325   BASOSABS 0.0 04/24/2015 2325   Comprehensive Metabolic Panel:    Component Value Date/Time   NA 131* 04/27/2015 0620   K 3.9 04/27/2015 0620   CL 91* 04/27/2015 0620   CO2 29 04/27/2015 0620   BUN 18 04/27/2015 0620   CREATININE 1.12 04/27/2015 0620   GLUCOSE 381* 04/27/2015 0620   CALCIUM 7.2* 04/27/2015 0620   AST 21 04/26/2015 0530   ALT 13* 04/26/2015 0530   ALKPHOS 94 04/26/2015 0530   BILITOT 1.0 04/26/2015 0530   PROT 6.9 04/26/2015 0530   ALBUMIN 2.9* 04/26/2015 0530    Physical Exam: Vital Signs: BP 115/62 mmHg  Pulse 84  Temp(Src) 98.6 F (37 C) (Oral)  Resp 18  Ht 6' (1.829 m)  Wt 105.8 kg (233 lb 4 oz)  BMI 31.63 kg/m2  SpO2 92% SpO2: SpO2: 92 % O2 Device: O2 Device: Nasal Cannula O2 Flow Rate: O2 Flow Rate (L/min): 4 L/min Intake/output summary:  Intake/Output Summary (Last 24 hours) at 04/27/15 1537 Last data filed at 04/27/15 1319  Gross per 24 hour  Intake    800 ml  Output   2675 ml  Net  -1875 ml   LBM: Last BM Date: 04/26/15 Baseline Weight: Weight: 101.606 kg (224 lb) Most recent weight: Weight: 105.8 kg (233 lb 4 oz)  Exam Findings:  Constitutional:  Elderly, looks older than stated age, frail. Lying in bed.  Resp:  Even and non labored.  Able to hold conversation.  GI: abd soft obese, non tender.           Palliative Performance Scale: 30-40% at best.               Additional Data Reviewed: Recent Labs     04/25/15  0517  04/25/15  1615  04/27/15  0620  WBC  12.4*   --   11.9*  HGB  9.1*   --   8.5*  PLT  297   --   350  NA  135  135  131*  BUN  '11  11  18  '$ CREATININE  1.02  0.93  1.12     Time In: 1505 Time Out: 1600 Time Total:  55 minutes Greater than 50%  of this time was spent counseling and coordinating care related to the above assessment and plan. GOC  discussion shared with nursing, CM, and Dr. [place] Pink.  Signed by: Drue Novel, NP  Drue Novel, NP  04/27/2015, 3:37 PM  Please contact Palliative Medicine Team phone at [phone] for questions and concerns.

## 2015-04-27 NOTE — Evaluation (Signed)
Physical Therapy Evaluation Patient Details Name: Sean [name] MRN: [id number] DOB: May 23, 1946 Today's Date: 04/27/2015   History of Present Illness  Pt admitted with headache, hypomagnesium, hypocalcemia.  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  Palliative care consult tomorrow per pt. PHM: CAD, CABG, AICD for ventricular fibrillation, DM2.  Clinical Impression  Patient is functioning at supervision level with all mobility and gait.  Patient will have 24 hour assist and Hospice services for home at d/c.  Patient currently has equipment needs met.  May want to consider hospital bed as mobility becomes more difficult.    Follow Up Recommendations No PT follow up;Supervision for mobility/OOB    Equipment Recommendations  None recommended by PT    Recommendations for Other Services       Precautions / Restrictions Precautions Precautions: None Restrictions Weight Bearing Restrictions: No      Mobility  Bed Mobility Overal bed mobility: Modified Independent             General bed mobility comments: Increased time, and use of rail  Transfers Overall transfer level: Modified independent Equipment used: None             General transfer comment: No physical assist needed.  Ambulation/Gait Ambulation/Gait assistance: Supervision Ambulation Distance (Feet): 24 Feet Assistive device: None Gait Pattern/deviations: Step-through pattern;Decreased stride length;Shuffle;Trunk flexed Gait velocity: decreased Gait velocity interpretation: Below normal speed for age/gender General Gait Details: Patient with fairly good balance with gait with no assistive device.  Does use cane occasionally at home.  Stairs            Wheelchair Mobility    Modified Rankin (Stroke Patients Only)       Balance Overall balance assessment: Needs assistance         Standing balance support: No upper extremity supported Standing balance-Leahy Scale: Fair                                Pertinent Vitals/Pain Pain Assessment: No/denies pain    Home Living Family/patient expects to be discharged to:: Private residence Living Arrangements: Spouse/significant other Available Help at Discharge: Family;Available 24 hours/day Type of Home: House         Home Equipment: Kasandra Knudsen - single point Additional Comments: Patient to have Hospice/Palliative Care services at home at d/c.    Prior Function Level of Independence: Independent with assistive device(s);Needs assistance   Gait / Transfers Assistance Needed: Used cane intermittently.  Wife provides supervision  ADL's / Homemaking Assistance Needed: wife assisted with LB dressing and housekeeping/meal prep, pt drives        Hand Dominance   Dominant Hand: Right    Extremity/Trunk Assessment   Upper Extremity Assessment: Defer to OT evaluation           Lower Extremity Assessment: Generalized weakness         Communication   Communication: No difficulties  Cognition Arousal/Alertness: Awake/alert Behavior During Therapy: WFL for tasks assessed/performed Overall Cognitive Status: Within Functional Limits for tasks assessed                      General Comments      Exercises        Assessment/Plan    PT Assessment Patent does not need any further PT services  PT Diagnosis Abnormality of gait;Generalized weakness   PT Problem List    PT Treatment Interventions  PT Goals (Current goals can be found in the Care Plan section) Acute Rehab PT Goals Patient Stated Goal: Wife:  For patient to have no pain.  Patient:  For wife and family to have support. PT Goal Formulation: All assessment and education complete, DC therapy    Frequency     Barriers to discharge        Co-evaluation               End of Session Equipment Utilized During Treatment: Gait belt Activity Tolerance: Patient limited by fatigue Patient left: in bed;with call bell/phone  within reach;with family/visitor present Nurse Communication: Mobility status         Time: 1528-1544 PT Time Calculation (min) (ACUTE ONLY): 16 min   Charges:   PT Evaluation $Initial PT Evaluation Tier I: 1 Procedure     PT G Codes:        Davis, [name] H 04/27/2015, 7:56 PM [name] H. Davis, PT, MBA Acute Rehab Services Pager [phone]   

## 2015-04-27 NOTE — Evaluation (Signed)
Occupational Therapy Evaluation Patient Details Name: Sean [name] MRN: [id number] DOB: September 29, 1945 Today's Date: 04/27/2015    History of Present Illness Pt admitted with headache, hypomagnesium, hypocalcemia.  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  Palliative care consult tomorrow per pt. PHM: CAD, CABG, AICD for ventricular fibrillation, DM2.   Clinical Impression Pt was assisted for LB dressing and intermittently used a cane prior to admission.  Pt reports new mild tremor.  He is dependent on 8L 02 and demonstrates decreased activity tolerance.  Will follow to further determine DME needs and instruct in energy conservation.       Follow Up Recommendations  No OT follow up    Equipment Recommendations  Tub/shower bench, 3 in 1 (to be determined)   Recommendations for Other Services       Precautions / Restrictions        Mobility Bed Mobility Overal bed mobility: Modified Independent             General bed mobility comments: heavy reliance on rail, increased time and effort, no physical assist  Transfers Overall transfer level: Modified independent Equipment used: None             General transfer comment: supervised for limited ambulation without a device    Balance Overall balance assessment: Needs assistance Sitting-balance support: Feet supported Sitting balance-Leahy Scale: Good       Standing balance-Leahy Scale: Fair Standing balance comment: balance does not require UB support                            ADL Overall ADL's : At baseline                                       General ADL Comments: Pt with decreased activity tolerance.  Would benefit from energy conservation education, breathing techniques and further education in use of DME for ADL.     Vision Additional Comments: reports intermittent blurriness, none currently   Perception     Praxis      Pertinent Vitals/Pain Pain Assessment:  No/denies pain     Hand Dominance Right   Extremity/Trunk Assessment Upper Extremity Assessment Upper Extremity Assessment: Overall WFL for tasks assessed (pt with new tremor B UEs)   Lower Extremity Assessment Lower Extremity Assessment: Defer to PT evaluation       Communication Communication Communication: No difficulties   Cognition Arousal/Alertness: Awake/alert Behavior During Therapy: WFL for tasks assessed/performed Overall Cognitive Status: Within Functional Limits for tasks assessed                     General Comments       Exercises       Shoulder Instructions      Home Living Family/patient expects to be discharged to:: Private residence Living Arrangements: Spouse/significant other Available Help at Discharge: Family;Available 24 hours/day Type of Home: House             Bathroom Shower/Tub: Tub/shower unit Shower/tub characteristics: Curtain Biochemist, clinical: Standard     Home Equipment: Cane - single point          Prior Functioning/Environment Level of Independence: Needs assistance  Gait / Transfers Assistance Needed: ambulated with cane intermittently ADL's / Homemaking Assistance Needed: wife assisted with LB dressing and housekeeping/meal prep, pt drives  OT Diagnosis: Generalized weakness   OT Problem List: Decreased activity tolerance;Impaired balance (sitting and/or standing);Decreased knowledge of use of DME or AE;Cardiopulmonary status limiting activity   OT Treatment/Interventions: DME and/or AE instruction;Patient/family education;Energy conservation    OT Goals(Current goals can be found in the care plan section) Acute Rehab OT Goals Patient Stated Goal: For his wife to have support as his disease progresses. OT Goal Formulation: With patient Time For Goal Achievement: 05/04/15 Potential to Achieve Goals: Good ADL Goals Additional ADL Goal #1: Pt will verbalize at least 3 energy conservation strategies  and demonstrate pursed lip breathing technique for use during ADL. Additional ADL Goal #2: Pt and caregivers will be knowledgeable in use/availability of tub bench, 3 in 1 and 4WW to enhance safety and energy conservation.   OT Frequency: Min 2X/week   Barriers to D/C:            Co-evaluation              End of Session Equipment Utilized During Treatment: Oxygen (8L)  Activity Tolerance: Patient limited by fatigue Patient left: in bed;with call bell/phone within reach;with bed alarm set   Time: 1340-1400 OT Time Calculation (min): 20 min Charges:  OT General Charges $OT Visit: 1 Procedure OT Evaluation $Initial OT Evaluation Tier I: 1 Procedure G-Codes:    Sean [name] 04/27/2015, 3:44 PM  [phone]

## 2015-04-27 NOTE — Progress Notes (Signed)
[facility] PROGRESS NOTE  Sean [name] IBB:[id number] DOB: August 28, 1945 DOA: 04/24/2015  PCP: Pcp Not In System  Brief HPI: 69 year old Caucasian male with the past medical history of metastatic small cell lung cancer, coronary artery disease status post CABG, AICD placement for ventricular fibrillation who presented to the emergency department with complaints of headache. Patient was noted to have hypomagnesemia, hypocalcemia, and a prolonged QT interval. He was hospitalized for further management. Discussions with his oncologist at the [facility] revealed that patient has metastatic lung cancer which is not responding to treatment. He also developed pulmonary edema secondary to acute systolic congestive heart failure. After discussions with family, hospice was consulted.  Past medical history:  Past Medical History  Diagnosis Date  . Myocardial infarction   . Ventricular tachycardia   . Diabetes mellitus   . COPD (chronic obstructive pulmonary disease)   . CAD (coronary artery disease) Dec 2012    s/p CABG x 3 with AVR; has normal EF  . Aortic stenosis Dec 2012    s/p AVR  . S/P ICD (internal cardiac defibrillator) procedure   . PTSD (post-traumatic stress disorder)   . HTN (hypertension)   . Tobacco abuse     Consultants: Palliative medicine  Procedures: None  Antibiotics: None  Subjective: Patient feels stronger. Able to get up and move around. Pain in the neck persists but is better. Breathing is also better.   Objective: Vital Signs  Filed Vitals:   04/26/15 0758 04/26/15 1646 04/26/15 2047 04/27/15 0506  BP: 94/60 95/53 140/61 96/54  Pulse: 72 78 87 87  Temp: 98.3 F (36.8 C) 98 F (36.7 C) 99.2 F (37.3 C) 98.5 F (36.9 C)  TempSrc: Oral Oral Oral Oral  Resp: '19 18 17 18  '$ Height:      Weight:      SpO2: 98% 95% 95% 90%    Intake/Output Summary (Last 24 hours) at 04/27/15 0932 Last data filed at 04/27/15 0626  Gross per 24 hour  Intake    480 ml    Output   2250 ml  Net  -1770 ml   Filed Weights   04/24/15 2048 04/25/15 0440 04/25/15 2034  Weight: 101.606 kg (224 lb) 105.688 kg (233 lb) 105.8 kg (233 lb 4 oz)    General appearance: alert, cooperative, appears stated age and no distress Resp: Improving air entry bilaterally. Continues to have crackles at the bases. Less than yesterday.  Cardio: regular rate and rhythm, S1, S2 normal, no murmur, click, rub or gallop GI: soft, non-tender; bowel sounds normal; no masses,  no organomegaly Neurologic: No focal deficits  Lab Results:  Basic Metabolic Panel:  Recent Labs Lab 04/24/15 2325 04/25/15 0311 04/25/15 0517 04/25/15 0920 04/25/15 1230 04/25/15 1615 04/26/15 0530 04/27/15 0620  NA 133*  --  135  --   --  135  --  131*  K 4.1  --  3.9  --   --  3.8  --  3.9  CL 91*  --  94*  --   --  95*  --  91*  CO2 31  --  30  --   --  29  --  29  GLUCOSE 355*  --  314*  --   --  151*  --  381*  BUN 13  --  11  --   --  11  --  18  CREATININE 1.17  --  1.02  --   --  0.93  --  1.12  CALCIUM 6.2*  --  6.3* 6.3*  --  6.7* 6.8* 7.2*  MG 0.8* 1.4*  --  1.3* 1.9  --   --  1.5*   CBC:  Recent Labs Lab 04/24/15 2325 04/25/15 0517 04/27/15 0620  WBC 13.4* 12.4* 11.9*  NEUTROABS 10.9*  --   --   HGB 9.7* 9.1* 8.5*  HCT 29.8* 29.2* 27.2*  MCV 88.2 89.6 88.9  PLT 313 297 350   BNP (last 3 results)  Recent Labs  03/26/15 1413  BNP 936.2*    CBG:  Recent Labs Lab 04/26/15 0735 04/26/15 1122 04/26/15 1644 04/26/15 2042 04/27/15 0746  GLUCAP 146* 233* 286* 360* 360*    Recent Results (from the past 240 hour(s))  MRSA PCR Screening     Status: None   Collection Time: 04/25/15  5:07 AM  Result Value Ref Range Status   MRSA by PCR NEGATIVE NEGATIVE Final    Comment:        The GeneXpert MRSA Assay (FDA approved for NASAL specimens only), is one component of a comprehensive MRSA colonization surveillance program. It is not intended to diagnose MRSA infection  nor to guide or monitor treatment for MRSA infections.       Studies/Results: Dg Chest Port 1 View  04/26/2015   CLINICAL DATA:  Hypoxia  EXAM: PORTABLE CHEST - 1 VIEW  COMPARISON:  04/25/2015  FINDINGS: Cardiac shadow remains enlarged. Postsurgical changes are again seen. A defibrillator is again noted and stable. Lungs remain well aerated. Persistent density is noted within the right lung stable from the previous exam. The left lung is clear.  IMPRESSION: Persistent density in the right lung consistent with asymmetric pulmonary edema. The overall appearance is stable from the prior exam.   Electronically Signed   By: Inez Catalina M.D.   On: 04/26/2015 12:10   Dg Chest Port 1 View  04/25/2015   CLINICAL DATA:  Pneumonia.  EXAM: PORTABLE CHEST - 1 VIEW  COMPARISON:  None.  FINDINGS: Mediastinum hilar structures normal. Cardiac pacer noted in stable position. Prior CABG. Cardiomegaly with pulmonary vascular prominence bilateral interstitial prominence consistent with congestive heart failure . Mild interim clearing of pulmonary edema noted. No pneumothorax  IMPRESSION: 1. Cardiac pacer stable position.  Prior CABG. 2. Congestive heart failure bilateral pulmonary interstitial edema. Partial clearing from prior exam .   Electronically Signed   By: Marcello Moores  Register   On: 04/25/2015 09:57    Medications:  Scheduled: . aspirin EC  81 mg Oral Daily  . atorvastatin  80 mg Oral QPM  . calcium carbonate  1 tablet Oral BID WC  . enoxaparin (LOVENOX) injection  40 mg Subcutaneous Daily  . furosemide  40 mg Intravenous Q12H  . gabapentin  600 mg Oral TID  . insulin aspart  0-20 Units Subcutaneous TID WC  . insulin aspart  8 Units Subcutaneous TID WC  . insulin glargine  60 Units Subcutaneous Daily  . lisinopril  10 mg Oral Daily  . magnesium oxide  400 mg Oral BID  . metoprolol succinate  25 mg Oral Daily  . pantoprazole  40 mg Oral Daily  . PARoxetine  10 mg Oral Daily  . sodium chloride  3 mL  Intravenous Q12H  . tamsulosin  0.4 mg Oral QPC supper   Continuous:  QPY:[id number], albuterol, ALPRAZolam, HYDROmorphone (DILAUDID) injection, ondansetron, oxyCODONE  Assessment/Plan:  Principal Problem:   QT prolongation Active Problems:   Lung cancer   Type 2 diabetes mellitus with  other circulatory complications   Hypocalcemia   Hypomagnesemia   Prolonged Q-T interval on ECG    Small cell lung cancer with metastases Discussed with the patient and his wife. Patient has received 4 cycles of chemotherapy with carboplatin and etoposide. I have also called and discussed with his oncologist, Dr. Sabra Heck at the [facility] in [place]. He tells me that patient had a repeat CT scan last week after 4 cycles of chemotherapy which did not show any improvement in his lung cancer and metastatic burden. He continues to have significant lesions in his liver, bones. He feels at this time that the patient has poor prognosis and would be a candidate for hospice. Patient and his family agreeable. Palliative medicine has been consulted to assist with disposition and placement into hospice services at home.   Acute systolic congestive heart failure Recent echocardiogram showed EF of 25%. Patient does have a history of coronary artery disease. This decline in ejection fraction is new. Question if this is secondary to chemotherapy. But considering his overall poor prognosis due to cancer no aggressive intervention at this time. Continue IV Lasix for symptomatic relief. He has AICD in place. This will eventually need to be turned off.  QT prolongation Likely secondary to hypomagnesemia. This has been aggressively repleted. Considering his poor overall prognosis from other comorbidities will not pursue this aggressively.  Electrolyte abnormalities Including hypocalcemia and hypomagnesemia. Patient is getting chemotherapy for lung cancer. Chemotherapeutic agents could be contributing. Corrected calcium is 7.6.  Magnesium is corrected. Continue calcium carbonate.  History of type 2 diabetes CBG is very poorly controlled. Increase dose of Lantus. Continue sliding scale coverage.  Headache and neck pain Imaging studies did not show any metastatic process or other acute findings. Pain control will be mainstay. Cannot have MRI due to AICD. Unlikely to be infectious etiology.  Normocytic Anemia Likely secondary to chronic disease. Continue to monitor.  DVT Prophylaxis: Lovenox    Code Status: DO NOT RESUSCITATE  Family Communication: Discussed with the patient and his wife along with his son   Disposition Plan: Continue IV Lasix for another day. Await palliative medicine to assist with initiating hospice services at home. Anticipate discharge home 1-2 days.     LOS: 2 days   [facility] Pager [phone] 04/27/2015, 9:32 AM  If 7PM-7AM, please contact night-coverage at [email or web address], password [facility]

## 2015-04-27 NOTE — Care Management Note (Signed)
Case Management Note  Patient Details  Name: Sean [name] MRN: [id number] Date of Birth: 19-Sep-1945  Subjective/Objective:            CM following for progression and d/c planning.        Action/Plan: 04/27/15 Met with pt and family , Home hospice services offered and agencies serving their area were discussed. They selected [facility]. Pt has home oxygen from [facility] therefore will need to change provider, will discuss with [facility] rep. No other DME needs at this time. [facility] notified of referral, await rep to meet with pt and family. Plan for d/c to home tomorrow , 04/28/15.  Expected Discharge Date:       04/28/15           Expected Discharge Plan:  Home w Hospice Care  In-House Referral:  NA  Discharge planning Services  CM Consult  Post Acute Care Choice:  NA Choice offered to:  NA  DME Arranged:    DME Agency:  [place] Arranged:  RN, Nurse's Aide [facility] Agency:  [facility]  Status of Service:  In process, will continue to follow  Medicare Important Message Given:    Date Medicare IM Given:    Medicare IM give by:    Date Additional Medicare IM Given:    Additional Medicare Important Message give by:     If discussed at [place] of Stay Meetings, dates discussed:    Additional Comments:  Adron Bene, RN 04/27/2015, 3:42 PM

## 2015-04-28 LAB — GLUCOSE, CAPILLARY
GLUCOSE-CAPILLARY: 185 mg/dL — AB (ref 65–99)
Glucose-Capillary: 320 mg/dL — ABNORMAL HIGH (ref 65–99)

## 2015-04-28 MED ORDER — FUROSEMIDE 40 MG PO TABS: 80.0000 mg | ORAL_TABLET | Freq: Two times a day (BID) | ORAL | Status: AC

## 2015-04-28 MED ORDER — ALPRAZOLAM 0.5 MG PO TABS: 0.5000 mg | ORAL_TABLET | Freq: Three times a day (TID) | ORAL | Status: AC | PRN

## 2015-04-28 MED ORDER — OXYCODONE HCL 5 MG PO TABS: 5.0000 mg | ORAL_TABLET | Freq: Four times a day (QID) | ORAL | Status: AC | PRN

## 2015-04-28 MED ORDER — FUROSEMIDE 80 MG PO TABS
80.0000 mg | ORAL_TABLET | Freq: Two times a day (BID) | ORAL | Status: DC
  Administered 2015-04-28: 80 mg via ORAL
  Filled 2015-04-28: qty 1

## 2015-04-28 NOTE — Progress Notes (Signed)
Bosie Clos to be D/C'd Home per MD order.  Discussed prescriptions and follow up appointments with the patient. Prescriptions given to patient, medication list explained in detail. Pt verbalized understanding.    Medication List    STOP taking these medications        atorvastatin 80 MG tablet  Commonly known as:  LIPITOR     lisinopril 10 MG tablet  Commonly known as:  PRINIVIL,ZESTRIL      TAKE these medications        albuterol 108 (90 BASE) MCG/ACT inhaler  Commonly known as:  PROVENTIL HFA;VENTOLIN HFA  Inhale 1-2 puffs into the lungs every 6 (six) hours as needed for shortness of breath.     ALPRAZolam 0.5 MG tablet  Commonly known as:  XANAX  Take 1 tablet (0.5 mg total) by mouth 3 (three) times daily as needed for anxiety.     aspirin 81 MG tablet  Take 81 mg by mouth daily.     furosemide 40 MG tablet  Commonly known as:  LASIX  Take 2 tablets (80 mg total) by mouth 2 (two) times daily.     gabapentin 600 MG tablet  Commonly known as:  NEURONTIN  Take 600 mg by mouth 3 (three) times daily.     insulin regular human CONCENTRATED 500 UNIT/ML injection  Commonly known as:  HUMULIN R  Inject 0.1 Units into the skin 2 (two) times daily with a meal.     metoprolol succinate 25 MG 24 hr tablet  Commonly known as:  TOPROL XL  Take 1 tablet (25 mg total) by mouth daily.     omeprazole 20 MG capsule  Commonly known as:  PRILOSEC  Take 20 mg by mouth every evening.     ondansetron 8 MG tablet  Commonly known as:  ZOFRAN  Take 8 mg by mouth every 8 (eight) hours as needed for nausea or vomiting.     oxyCODONE 5 MG immediate release tablet  Commonly known as:  Oxy IR/ROXICODONE  Take 1 tablet (5 mg total) by mouth every 6 (six) hours as needed for severe pain (pain).     PARoxetine 20 MG tablet  Commonly known as:  PAXIL  Take 10 mg by mouth daily.     tamsulosin 0.4 MG Caps capsule  Commonly known as:  FLOMAX  Take 0.4 mg by mouth daily after supper.      zolpidem 10 MG tablet  Commonly known as:  AMBIEN  Take 5 mg by mouth at bedtime.        Filed Vitals:   04/28/15 0737  BP: 122/54  Pulse: 82  Temp: 98.6 F (37 C)  Resp: 18    Skin clean, dry and intact without evidence of skin break down, no evidence of skin tears noted. IV catheter discontinued intact. Site without signs and symptoms of complications. Dressing and pressure applied. Pt denies pain at this time. No complaints noted.  An After Visit Summary was printed and given to the patient. Patient escorted via [place], and D/C home via private auto.  Alanna Storti A 04/28/2015 5:44 PM

## 2015-04-28 NOTE — Progress Notes (Signed)
Notified by [name], CMRN of pt./family request for [facility] at home after discharge. Chart and patient information reviewed with Dr. Brigid Re [facility] Medical Director and hospice eligibility confirmed.  Writer spoke with patient, his wife and son at the bedside to initiate education related to hospice philosophy, services and team approach to care. Family voiced understanding of information provided. Per discussion plan is for discharge to home by personal vehicle with family. [facility] contacted to bring a portable O2 tank to pt's room for the ride home.  Please send signed and completed DNR form home with patient. Pt. will need prescriptions for discharge comfort medications.  DME needs discussed and family requests O2 and a tub seat with a back to be delivered to the home today. [facility] equipment  manager Jewel Hughes notified yesterday and has contacted [place] to arrange delivery to the home. Pt's wife is the family member to call to arrange time of delivery. The home address has been verified and is correct in the chart.  [facility] referral Center aware of above. [facility] contact numbers have been giving to family during visit. Above information shared with [name], [facility] and  pt' s RN Delana Meyer. Please call with any questions.  [facility] Liaison [phone]

## 2015-04-28 NOTE — Care Management Important Message (Signed)
Important Message  Patient Details  Name: Sean [name] MRN: [id number] Date of Birth: Jul 01, 1946   Medicare Important Message Given:  Yes-second notification given    Delorse Lek 04/28/2015, 11:59 AM

## 2015-04-28 NOTE — Discharge Instructions (Signed)
Hospice °Hospice is a service that is designed to provide people who are terminally ill and their families with medical, spiritual, and psychological support. Its aim is to improve your quality of life by keeping you as alert and comfortable as possible. Hospice is performed by a team of health care professionals and volunteers who: °· Help keep you comfortable. Hospice can be provided in your home or in a homelike setting. The hospice staff works with your family and friends to help meet your needs. You will enjoy the support of loved ones by receiving much of your basic care from family and friends. °· Provide pain relief and manage your symptoms. The staff supply all necessary medicines and equipment. °· Provide companionship when you are alone. °· Allow you and your family to rest. They may do light housekeeping, prepare meals, and run errands. °· Provide counseling. They will make sure your emotional, spiritual, and social needs and those of your family are being met. °· Provide spiritual care. Spiritual care is individualized to meet your needs and your family's needs. It may involve helping you look at what death means to you, say goodbye, or perform a specific religious ceremony or ritual. °Hospice teams often include: °· A nurse. °· A doctor. °· Social workers. °· Religious leaders (such as a chaplain). °· Trained volunteers. °WHEN SHOULD HOSPICE CARE BEGIN? °Most people who use hospice are believed to have fewer than 6 months to live. Your family and health care providers can help you decide when hospice services should begin. If your condition improves, you may discontinue the program. °WHAT SHOULD I CONSIDER BEFORE SELECTING A PROGRAM? °Most hospice programs are run by nonprofit, independent organizations. Some are affiliated with hospitals, nursing homes, or home health care agencies. Hospice programs can take place in the home or at a hospice center, hospital, or skilled nursing facility. When choosing  a hospice program, ask the following questions: °· What services are available to me? °· What services are offered to my loved ones? °· How involved are my loved ones? °· How involved is my health care provider? °· Who makes up the hospice care team? How are they trained or screened? °· How will my pain and symptoms be managed? °· If my circumstances change, can the services be provided in a different setting, such as my home or in the hospital? °· Is the program reviewed and licensed by the state or certified in some other way? °WHERE CAN I LEARN MORE ABOUT HOSPICE? °You can learn about existing hospice programs in your area from your health care providers. You can also read more about hospice online. The websites of the following organizations contain helpful information: °· The National Hospice and Palliative Care Organization (NHPCO). °· The Hospice Association of America (HAA). °· The Hospice Education Institute. °· The American Cancer Society (ACS). °· Hospice Net. °Document Released: 11/22/2003 Document Revised: 08/10/2013 Document Reviewed: 06/15/2013 °ExitCare® Patient Information ©2015 [employer]. This information is not intended to replace advice given to you by your health care provider. Make sure you discuss any questions you have with your health care provider. ° °

## 2015-04-28 NOTE — Progress Notes (Signed)
Occupational Therapy Treatment Patient Details Name: Sean [name] MRN: [id number] DOB: 07-15-1946 Today's Date: 04/28/2015    History of present illness Pt admitted with headache, hypomagnesium, hypocalcemia.  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  Palliative care consult tomorrow per pt. PHM: CAD, CABG, AICD for ventricular fibrillation, DM2.   OT comments  Focus of session on educating pt and family in energy conservation, breathing techniques and benefits of DME for ADL.  Pt is not interested in a hospital bed at this time, although he is heavily reliant on the rail and HOB being up.  Raised concern of pt pulling up on his wife and injuring her. Son asking about getting bed pads for home, deferred question to pt's hospice nurse. No further OT needs.   Follow Up Recommendations  No OT follow up    Equipment Recommendations  3 in 1 bedside comode;Tub/shower bench;Hospital bed (4UJ) when deemed appropriate   Recommendations for Other Services      Precautions / Restrictions Precautions Precautions: None Restrictions Weight Bearing Restrictions: No       Mobility Bed Mobility Overal bed mobility: Needs Assistance Bed Mobility: Rolling;Sidelying to Sit Rolling: Modified independent (Device/Increase time) Sidelying to sit: Min assist;HOB elevated       General bed mobility comments: Increased time, and use of rail, assist to raise trunk.  Transfers                      Balance     Sitting balance-Leahy Scale: Good                             ADL                                         General ADL Comments: Educated pt, son and family friend in benefits of tub seat, 3 in 1, 4WW, hand held shower head, and hospital bed for energy conservation and decrease burden of care on his family.  Instructed in purse lip breathing techniques.       Vision                     Perception     Praxis      Cognition    Behavior During Therapy: Flat affect Overall Cognitive Status: Within Functional Limits for tasks assessed                       Extremity/Trunk Assessment               Exercises     Shoulder Instructions       General Comments      Pertinent Vitals/ Pain       Pain Assessment: No/denies pain  Home Living                                          Prior Functioning/Environment              Frequency       Progress Toward Goals  OT Goals(current goals can now be found in the care plan section)  Progress towards OT goals: Goals met/education completed, patient discharged from OT  Plan Discharge plan remains appropriate    Co-evaluation                 End of Session Equipment Utilized During Treatment: Oxygen   Activity Tolerance Patient limited by fatigue   Patient Left in bed;with call bell/phone within reach;with family/visitor present   Nurse Communication  (pt with plans for home today with hospice)        Time: 9323-5573 OT Time Calculation (min): 14 min  Charges: OT General Charges $OT Visit: 1 Procedure OT Treatments $Self Care/Home Management : 8-22 mins  Malka So 04/28/2015, 12:22 PM [phone]

## 2015-04-28 NOTE — Discharge Summary (Signed)
[facility]  Physician Discharge Summary   Patient ID: Sean [name] MRN: [id number] DOB/AGE: 1946-06-15 69 y.o.  Admit date: 04/24/2015 Discharge date: 04/28/2015  PCP: [facility]:  Principal Problem:   QT prolongation Active Problems:   Lung cancer   Type 2 diabetes mellitus with other circulatory complications   Hypocalcemia   Hypomagnesemia   Prolonged Q-T interval on ECG   Neck pain   Palliative care encounter   RECOMMENDATIONS FOR OUTPATIENT FOLLOW UP: 1. Patient to go home with hospice services 2. Patient's AICD will eventually need to be turned off. Please see discussion below.   DISCHARGE CONDITION: fair  Diet recommendation: Modified carbohydrate  Filed Weights   04/25/15 0440 04/25/15 2034 04/27/15 2053  Weight: 105.688 kg (233 lb) 105.8 kg (233 lb 4 oz) 106.2 kg (234 lb 2.1 oz)    INITIAL HISTORY: 69 year old Caucasian male with the past medical history of metastatic small cell lung cancer, coronary artery disease status post CABG, AICD placement for ventricular fibrillation who presented to the emergency department with complaints of headache. Patient was noted to have hypomagnesemia, hypocalcemia, and a prolonged QT interval. He was hospitalized for further management. Discussions with his oncologist at the [facility] revealed that patient has metastatic lung cancer which is not responding to treatment. He also developed pulmonary edema secondary to acute systolic congestive heart failure. After discussions with family, hospice was consulted.  Consultations:  Palliative medicine   HOSPITAL COURSE:   Small cell lung cancer with metastases Patient has received 4 cycles of chemotherapy with carboplatin and etoposide. Patient's case was discussed with his oncologist, Dr. Sabra Heck at the [facility] in [place]. He mentioned that patient had a repeat CT scan last week after 4 cycles of chemotherapy which did not show any improvement in his lung  cancer and metastatic burden. Patient continues to have significant lesions in his liver, bones. Dr. Sabra Heck feels at this time that the patient has poor prognosis and would be a candidate for hospice. Patient and his family agreeable. Palliative medicine was consulted to assist with disposition and placement into hospice services at home. Family to take him home today. Hospice services have been arranged.  Acute systolic congestive heart failure Recent echocardiogram showed EF of 25%. Patient does have a history of coronary artery disease. This decline in ejection fraction is new. Question if this is secondary to chemotherapy. But considering his overall poor prognosis due to cancer, no aggressive intervention at this time. She was given IV Lasix for symptomatic relief. He'll be discharged on a higher dose of oral Lasix. He has AICD in place. This will eventually need to be turned off. I did offer to have [employer] representative to come into the hospital to turn it off. Patient does not want to have it currently. This will need to be an ongoing discussion in the next few days to weeks between the patient and hospice.  QT prolongation Likely secondary to hypomagnesemia. This has been aggressively repleted. Considering his poor overall prognosis from other comorbidities will not pursue this aggressively.  Electrolyte abnormalities Including hypocalcemia and hypomagnesemia. Patient was getting chemotherapy for lung cancer. Chemotherapeutic agents could be contributing. Corrected calcium is 7.6. Magnesium is corrected.   History of type 2 diabetes Patient to resume his usual home regimen.   Headache and neck pain Imaging studies did not show any metastatic process or other acute findings. Pain control will be mainstay. Cannot have MRI due to AICD. Unlikely to be infectious etiology.  Normocytic Anemia Likely secondary to chronic disease.   Overall, patient remains stable. Hospice services  have been arranged. Okay for discharge today. Will stop nonessential medications.    PERTINENT LABS:  The results of significant diagnostics from this hospitalization (including imaging, microbiology, ancillary and laboratory) are listed below for reference.    Microbiology: Recent Results (from the past 240 hour(s))  MRSA PCR Screening     Status: None   Collection Time: 04/25/15  5:07 AM  Result Value Ref Range Status   MRSA by PCR NEGATIVE NEGATIVE Final    Comment:        The GeneXpert MRSA Assay (FDA approved for NASAL specimens only), is one component of a comprehensive MRSA colonization surveillance program. It is not intended to diagnose MRSA infection nor to guide or monitor treatment for MRSA infections.      Labs: Basic Metabolic Panel:  Recent Labs Lab 04/24/15 2325 04/25/15 0311 04/25/15 0517 04/25/15 0920 04/25/15 1230 04/25/15 1615 04/26/15 0530 04/27/15 0620  NA 133*  --  135  --   --  135  --  131*  K 4.1  --  3.9  --   --  3.8  --  3.9  CL 91*  --  94*  --   --  95*  --  91*  CO2 31  --  30  --   --  29  --  29  GLUCOSE 355*  --  314*  --   --  151*  --  381*  BUN 13  --  11  --   --  11  --  18  CREATININE 1.17  --  1.02  --   --  0.93  --  1.12  CALCIUM 6.2*  --  6.3* 6.3*  --  6.7* 6.8* 7.2*  MG 0.8* 1.4*  --  1.3* 1.9  --   --  1.5*   Liver Function Tests:  Recent Labs Lab 04/26/15 0530  AST 21  ALT 13*  ALKPHOS 94  BILITOT 1.0  PROT 6.9  ALBUMIN 2.9*   CBC:  Recent Labs Lab 04/24/15 2325 04/25/15 0517 04/27/15 0620  WBC 13.4* 12.4* 11.9*  NEUTROABS 10.9*  --   --   HGB 9.7* 9.1* 8.5*  HCT 29.8* 29.2* 27.2*  MCV 88.2 89.6 88.9  PLT 313 297 350   CBG:  Recent Labs Lab 04/27/15 1145 04/27/15 1712 04/27/15 2051 04/28/15 0734 04/28/15 1211  GLUCAP 265* 124* 135* 185* 320*     IMAGING STUDIES Ct Head Wo Contrast  04/25/2015   CLINICAL DATA:  New onset headaches. History of Coronary artery disease, aortic  stenosis, ventricular fibrillation, lung cancer with bone metastasis on chemotherapy.  EXAM: CT HEAD WITHOUT CONTRAST  TECHNIQUE: Contiguous axial images were obtained from the base of the skull through the vertex without intravenous contrast.  COMPARISON:  07/28/2011  FINDINGS: Mild diffuse cerebral atrophy. Patchy low-attenuation changes in the deep white matter consistent with small vessel ischemia. No ventricular dilatation. No mass effect or midline shift. No abnormal extra-axial fluid collections. Gray-white matter junctions are distinct. Basal cisterns are not effaced. No evidence of acute intracranial hemorrhage. No depressed skull fractures. Mild mucosal thickening in the paranasal sinuses. No acute air-fluid levels. Mastoid air cells are not opacified. Vascular calcifications.  IMPRESSION: No acute intracranial abnormalities. Chronic atrophy and small vessel ischemic changes.   Electronically Signed   By: Lucienne Capers M.D.   On: 04/25/2015 00:10   Ct Cervical Spine  Wo Contrast  04/25/2015   CLINICAL DATA:  69 year old male with right-sided neck pain. No known injury.  EXAM: CT CERVICAL SPINE WITHOUT CONTRAST  TECHNIQUE: Multidetector CT imaging of the cervical spine was performed without intravenous contrast. Multiplanar CT image reconstructions were also generated.  COMPARISON:  None.  FINDINGS: Evaluation of this exam is limited due to respiratory motion artifact.  There is no acute fracture or subluxation of the cervical spine.There are multilevel degenerative changes.The odontoid and spinous processes are intact.There is normal anatomic alignment of the C1-C2 lateral masses. The visualized soft tissues appear unremarkable.  Left pectoral pacemaker device partially visualized. Partially visualized right pleural effusion. Bilateral pulmonary airspace opacity more prominent on the right concerning for pneumonia. Clinical correlation is recommended. CT of the chest may provide better evaluation of  the lungs if clinically indicated. Apparent density within the trachea may be related to motion artifact or aspirated material.  IMPRESSION: No acute fracture or subluxation of the cervical spine.  Small partially visualized right pleural effusion with bilateral upper lobe airspace opacity, more prominent on the right concerning for pneumonia. Clinical correlation is recommended.  Motion artifact versus aspirated material within the trachea.   Electronically Signed   By: Anner Crete M.D.   On: 04/25/2015 04:06   Dg Chest Port 1 View  04/26/2015   CLINICAL DATA:  Hypoxia  EXAM: PORTABLE CHEST - 1 VIEW  COMPARISON:  04/25/2015  FINDINGS: Cardiac shadow remains enlarged. Postsurgical changes are again seen. A defibrillator is again noted and stable. Lungs remain well aerated. Persistent density is noted within the right lung stable from the previous exam. The left lung is clear.  IMPRESSION: Persistent density in the right lung consistent with asymmetric pulmonary edema. The overall appearance is stable from the prior exam.   Electronically Signed   By: Inez Catalina M.D.   On: 04/26/2015 12:10   Dg Chest Port 1 View  04/25/2015   CLINICAL DATA:  Pneumonia.  EXAM: PORTABLE CHEST - 1 VIEW  COMPARISON:  None.  FINDINGS: Mediastinum hilar structures normal. Cardiac pacer noted in stable position. Prior CABG. Cardiomegaly with pulmonary vascular prominence bilateral interstitial prominence consistent with congestive heart failure . Mild interim clearing of pulmonary edema noted. No pneumothorax  IMPRESSION: 1. Cardiac pacer stable position.  Prior CABG. 2. Congestive heart failure bilateral pulmonary interstitial edema. Partial clearing from prior exam .   Electronically Signed   By: Marcello Moores  Register   On: 04/25/2015 09:57    DISCHARGE EXAMINATION: Filed Vitals:   04/27/15 1719 04/27/15 2053 04/28/15 0452 04/28/15 0737  BP: 109/57 91/47 115/66 122/54  Pulse: 84 43 81 82  Temp: 97.4 F (36.3 C) 98.7 F  (37.1 C) 98.8 F (37.1 C) 98.6 F (37 C)  TempSrc: Oral Oral Oral Oral  Resp: '18 17 18 18  '$ Height:      Weight:  106.2 kg (234 lb 2.1 oz)    SpO2: 99% 95% 96% 92%   General appearance: alert, cooperative, appears stated age and no distress Resp: Crackles at the bases bilaterally Cardio: regular rate and rhythm, S1, S2 normal, no murmur, click, rub or gallop GI: soft, non-tender; bowel sounds normal; no masses,  no organomegaly Extremities: extremities normal, atraumatic, no cyanosis or edema  DISPOSITION: Home with hospice  Discharge Instructions    Call MD for:  difficulty breathing, headache or visual disturbances    Complete by:  As directed      Call MD for:  extreme fatigue  Complete by:  As directed      Call MD for:  persistant dizziness or light-headedness    Complete by:  As directed      Call MD for:  persistant nausea and vomiting    Complete by:  As directed      Call MD for:  severe uncontrolled pain    Complete by:  As directed      Call MD for:  temperature >100.4    Complete by:  As directed      Diet Carb Modified    Complete by:  As directed      Discharge instructions    Complete by:  As directed   Please call hospice for any needs that arise at home.  You were cared for by a hospitalist during your hospital stay. If you have any questions about your discharge medications or the care you received while you were in the hospital after you are discharged, you can call the unit and asked to speak with the hospitalist on call if the hospitalist that took care of you is not available. Once you are discharged, your primary care physician will handle any further medical issues. Please note that NO REFILLS for any discharge medications will be authorized once you are discharged, as it is imperative that you return to your primary care physician (or establish a relationship with a primary care physician if you do not have one) for your aftercare needs so that they can  reassess your need for medications and monitor your lab values. If you do not have a primary care physician, you can call [phone] for a physician referral.     Increase activity slowly    Complete by:  As directed            ALLERGIES:  Allergies  Allergen Reactions  . Procaine Hcl Other (See Comments)    unknown  . Rosuvastatin Other (See Comments)    Muscle pain     Current Discharge Medication List    START taking these medications   Details  ALPRAZolam (XANAX) 0.5 MG tablet Take 1 tablet (0.5 mg total) by mouth 3 (three) times daily as needed for anxiety. Qty: 30 tablet, Refills: 0      CONTINUE these medications which have CHANGED   Details  furosemide (LASIX) 40 MG tablet Take 2 tablets (80 mg total) by mouth 2 (two) times daily. Qty: 120 tablet, Refills: 0    oxyCODONE (OXY IR/ROXICODONE) 5 MG immediate release tablet Take 1 tablet (5 mg total) by mouth every 6 (six) hours as needed for severe pain (pain). Qty: 30 tablet, Refills: 0      CONTINUE these medications which have NOT CHANGED   Details  albuterol (PROVENTIL HFA;VENTOLIN HFA) 108 (90 BASE) MCG/ACT inhaler Inhale 1-2 puffs into the lungs every 6 (six) hours as needed for shortness of breath.     aspirin 81 MG tablet Take 81 mg by mouth daily.     gabapentin (NEURONTIN) 600 MG tablet Take 600 mg by mouth 3 (three) times daily.     insulin regular human CONCENTRATED (HUMULIN R) 500 UNIT/ML SOLN injection Inject 0.1 Units into the skin 2 (two) times daily with a meal.     metoprolol succinate (TOPROL XL) 25 MG 24 hr tablet Take 1 tablet (25 mg total) by mouth daily. Qty: 30 tablet, Refills: 0    omeprazole (PRILOSEC) 20 MG capsule Take 20 mg by mouth every evening.    ondansetron (ZOFRAN)  8 MG tablet Take 8 mg by mouth every 8 (eight) hours as needed for nausea or vomiting.    PARoxetine (PAXIL) 20 MG tablet Take 10 mg by mouth daily.     tamsulosin (FLOMAX) 0.4 MG CAPS capsule Take 0.4 mg by mouth  daily after supper.     zolpidem (AMBIEN) 10 MG tablet Take 5 mg by mouth at bedtime.      STOP taking these medications     atorvastatin (LIPITOR) 80 MG tablet      lisinopril (PRINIVIL,ZESTRIL) 10 MG tablet          TOTAL DISCHARGE TIME: 35 minutes  [facility] Pager [phone]  04/28/2015, 2:17 PM

## 2015-05-20 DEATH — deceased
